# Patient Record
Sex: Male | Born: 1940 | Race: Black or African American | Hispanic: No | Smoking: Former smoker
Health system: Southern US, Community
[De-identification: ages and names within clinical notes are randomized; demographics above are authoritative.]

## PROBLEM LIST (undated history)

## (undated) DIAGNOSIS — I509 Heart failure, unspecified: Secondary | ICD-10-CM

## (undated) DIAGNOSIS — I482 Chronic atrial fibrillation, unspecified: Secondary | ICD-10-CM

## (undated) DIAGNOSIS — I429 Cardiomyopathy, unspecified: Secondary | ICD-10-CM

## (undated) DIAGNOSIS — J449 Chronic obstructive pulmonary disease, unspecified: Secondary | ICD-10-CM

## (undated) DIAGNOSIS — I4819 Other persistent atrial fibrillation: Secondary | ICD-10-CM

## (undated) DIAGNOSIS — R569 Unspecified convulsions: Secondary | ICD-10-CM

## (undated) DIAGNOSIS — J8410 Pulmonary fibrosis, unspecified: Secondary | ICD-10-CM

## (undated) DIAGNOSIS — I700 Atherosclerosis of aorta: Secondary | ICD-10-CM

## (undated) DIAGNOSIS — F1010 Alcohol abuse, uncomplicated: Secondary | ICD-10-CM

## (undated) DIAGNOSIS — J9621 Acute and chronic respiratory failure with hypoxia: Secondary | ICD-10-CM

## (undated) DIAGNOSIS — D099 Carcinoma in situ, unspecified: Secondary | ICD-10-CM

---

## 1999-10-11 ENCOUNTER — Emergency Department (HOSPITAL_COMMUNITY): Admission: EM | Admit: 1999-10-11 | Discharge: 1999-10-11 | Payer: Self-pay | Admitting: Emergency Medicine

## 2000-07-30 ENCOUNTER — Inpatient Hospital Stay (HOSPITAL_COMMUNITY): Admission: EM | Admit: 2000-07-30 | Discharge: 2000-08-04 | Payer: Self-pay | Admitting: Emergency Medicine

## 2000-07-30 ENCOUNTER — Encounter: Payer: Self-pay | Admitting: Internal Medicine

## 2001-05-25 ENCOUNTER — Encounter: Payer: Self-pay | Admitting: Emergency Medicine

## 2001-05-25 ENCOUNTER — Inpatient Hospital Stay (HOSPITAL_COMMUNITY): Admission: EM | Admit: 2001-05-25 | Discharge: 2001-06-05 | Payer: Self-pay | Admitting: Emergency Medicine

## 2001-05-27 ENCOUNTER — Encounter: Payer: Self-pay | Admitting: Internal Medicine

## 2001-05-31 ENCOUNTER — Encounter: Payer: Self-pay | Admitting: Internal Medicine

## 2001-06-04 ENCOUNTER — Encounter: Payer: Self-pay | Admitting: Internal Medicine

## 2001-06-12 ENCOUNTER — Encounter: Admission: RE | Admit: 2001-06-12 | Discharge: 2001-06-12 | Payer: Self-pay | Admitting: Internal Medicine

## 2001-11-23 ENCOUNTER — Encounter: Payer: Self-pay | Admitting: Emergency Medicine

## 2001-11-23 ENCOUNTER — Emergency Department (HOSPITAL_COMMUNITY): Admission: EM | Admit: 2001-11-23 | Discharge: 2001-11-23 | Payer: Self-pay | Admitting: Emergency Medicine

## 2002-01-09 ENCOUNTER — Inpatient Hospital Stay (HOSPITAL_COMMUNITY): Admission: EM | Admit: 2002-01-09 | Discharge: 2002-01-15 | Payer: Self-pay | Admitting: Emergency Medicine

## 2002-01-09 ENCOUNTER — Encounter: Payer: Self-pay | Admitting: Internal Medicine

## 2002-04-06 ENCOUNTER — Emergency Department (HOSPITAL_COMMUNITY): Admission: EM | Admit: 2002-04-06 | Discharge: 2002-04-06 | Payer: Self-pay

## 2002-05-23 ENCOUNTER — Emergency Department (HOSPITAL_COMMUNITY): Admission: EM | Admit: 2002-05-23 | Discharge: 2002-05-23 | Payer: Self-pay | Admitting: Emergency Medicine

## 2002-08-24 ENCOUNTER — Emergency Department (HOSPITAL_COMMUNITY): Admission: EM | Admit: 2002-08-24 | Discharge: 2002-08-24 | Payer: Self-pay | Admitting: Emergency Medicine

## 2002-09-18 ENCOUNTER — Emergency Department (HOSPITAL_COMMUNITY): Admission: EM | Admit: 2002-09-18 | Discharge: 2002-09-18 | Payer: Self-pay

## 2002-09-18 ENCOUNTER — Encounter: Payer: Self-pay | Admitting: Emergency Medicine

## 2002-11-27 ENCOUNTER — Emergency Department (HOSPITAL_COMMUNITY): Admission: EM | Admit: 2002-11-27 | Discharge: 2002-11-27 | Payer: Self-pay | Admitting: Emergency Medicine

## 2003-03-10 ENCOUNTER — Inpatient Hospital Stay (HOSPITAL_COMMUNITY): Admission: EM | Admit: 2003-03-10 | Discharge: 2003-03-18 | Payer: Self-pay | Admitting: Physical Therapy

## 2003-04-14 ENCOUNTER — Ambulatory Visit (HOSPITAL_COMMUNITY): Admission: RE | Admit: 2003-04-14 | Discharge: 2003-04-14 | Payer: Self-pay | Admitting: Family Medicine

## 2003-04-14 ENCOUNTER — Encounter: Payer: Self-pay | Admitting: Family Medicine

## 2004-04-13 ENCOUNTER — Emergency Department (HOSPITAL_COMMUNITY): Admission: EM | Admit: 2004-04-13 | Discharge: 2004-04-13 | Payer: Self-pay

## 2004-04-13 ENCOUNTER — Emergency Department (HOSPITAL_COMMUNITY): Admission: EM | Admit: 2004-04-13 | Discharge: 2004-04-13 | Payer: Self-pay | Admitting: Emergency Medicine

## 2006-03-30 ENCOUNTER — Emergency Department (HOSPITAL_COMMUNITY): Admission: EM | Admit: 2006-03-30 | Discharge: 2006-03-30 | Payer: Self-pay | Admitting: Emergency Medicine

## 2008-07-14 ENCOUNTER — Emergency Department (HOSPITAL_COMMUNITY): Admission: EM | Admit: 2008-07-14 | Discharge: 2008-07-14 | Payer: Self-pay | Admitting: Emergency Medicine

## 2008-08-18 ENCOUNTER — Encounter: Admission: RE | Admit: 2008-08-18 | Discharge: 2008-08-18 | Payer: Self-pay | Admitting: Family Medicine

## 2008-08-21 ENCOUNTER — Emergency Department (HOSPITAL_COMMUNITY): Admission: EM | Admit: 2008-08-21 | Discharge: 2008-08-21 | Payer: Self-pay | Admitting: Emergency Medicine

## 2009-06-18 ENCOUNTER — Emergency Department (HOSPITAL_COMMUNITY): Admission: EM | Admit: 2009-06-18 | Discharge: 2009-06-19 | Payer: Self-pay | Admitting: Emergency Medicine

## 2010-02-02 ENCOUNTER — Ambulatory Visit (HOSPITAL_COMMUNITY): Admission: RE | Admit: 2010-02-02 | Discharge: 2010-02-02 | Payer: Self-pay | Admitting: Emergency Medicine

## 2010-02-02 ENCOUNTER — Emergency Department (HOSPITAL_COMMUNITY): Admission: EM | Admit: 2010-02-02 | Discharge: 2010-02-02 | Payer: Self-pay | Admitting: Emergency Medicine

## 2011-02-04 LAB — CBC
HCT: 38.5 % — ABNORMAL LOW (ref 39.0–52.0)
Hemoglobin: 13.6 g/dL (ref 13.0–17.0)
Platelets: 213 10*3/uL (ref 150–400)
RDW: 13.5 % (ref 11.5–15.5)
WBC: 8.1 10*3/uL (ref 4.0–10.5)

## 2011-02-04 LAB — BASIC METABOLIC PANEL
CO2: 27 mEq/L (ref 19–32)
Creatinine, Ser: 0.83 mg/dL (ref 0.4–1.5)
GFR calc non Af Amer: >60 mL/min (ref >60)
Glucose, Bld: 122 mg/dL — ABNORMAL HIGH (ref 70–99)

## 2011-02-04 LAB — DIFFERENTIAL
Lymphocytes Relative: 14 % (ref 12–46)
Lymphs Abs: 1.1 10*3/uL (ref 0.7–4.0)
Neutrophils Relative %: 75 % (ref 43–77)

## 2011-02-04 LAB — URIC ACID: Uric Acid, Serum: 4.3 mg/dL (ref 4.0–7.8)

## 2011-03-17 NOTE — H&P (Signed)
[name]. [facility]  Patient:    Terry, Carter Visit Number: [id number] MRN: [id number]          Service Type: MED Location: [phone] Attending Physician:  Levy Sjogren Dictated by:   Rosanne Sack, M.D. Admit Date:  01/09/2002                           History and Physical  DATE OF BIRTH:  08/13/2041  PROBLEM LIST:  1. Alcohol withdrawal syndrome.  2. Seizure activity secondary to alcohol withdrawal and seizure disorder.     a. Noncompliant with Dilantin therapy.  3. Paroxysmal atrial fibrillation with rapid ventricular response, new     problem.  4. Dehydration.  5. Chronic obstructive pulmonary disease with ongoing tobacco abuse of one     pack per day x 45 years.  6. History of aspiration pneumonia in October of 2001.  7. Chronic alcohol abuse.     a. History of alcohol hepatitis.     b. Malnutrition.     c. Previous history of withdrawal syndrome.  8. Normocytic anemia with a hemoglobin of 12.8.  9. Organic gait disorder. 10. History of seizure disorder as described above. 11. Noncompliant with medications.  CHIEF COMPLAINT:  Tremor.  HISTORY OF PRESENT ILLNESS:  [name] is a very pleasant 70 year old gentleman who presents to the emergency department with tremor and slight confusion.  The patient had a tonic-clonic seizure witnessed by the ER staff. Reviewing the medical record, the patient has been admitted to the teaching service in the past.  Seizure activity due to alcohol withdrawal syndrome and seizure disorder have been described in the past.  The patient was supposed to take Dilantin for this problem.  The patient states that he has not taken his medication for months.  [name] is a little bit confused and the history taking was somewhat difficult.  The patient denies falls, syncope, shortness of breath, chest pain, orthopnea, PND, lower extremity swelling, skin rash, or headache.  He states that his  last drink, which was beer was about a day-and-a-half ago.  He denies any illegal drug use or IV drug abuse.  He states that he still continues smoking as in the past.  PAST MEDICAL HISTORY:  As in problem list.  ALLERGIES:  None.  MEDICATIONS:  The patient was not taking Dilantin as described above.  He was supposed to take Dilantin 200 mg in the morning, 100 mg at lunch, and 200 mg in the evening.  FAMILY MEDICAL HISTORY:  The patient denies diabetes, hypertension, early coronary artery disease, strokes, and malignancies in the family.  SOCIAL HISTORY:  The patient lives with his girlfriend here in [place], [place].  He smokes about a pack per day as described in the problem list.  He drinks beer, at least six to eight beers a day, as well as liquor. The patient is unemployed.  REVIEW OF SYSTEMS:  As in HPI.  PHYSICAL EXAMINATION:  Temperature 99.3 degrees, blood pressure 158/90, heart rate 115, currently heart rate of 65, respiratory rate 22, oxygen saturation 95% on room air.  HEENT:  Normocephalic and atraumatic.  Nonicteric sclerae.  Conjunctivae both within normal limits.  PERRLA.  EOMI.  Funduscopic exam negative for papilledema and hemorrhages.  TMs within normal limits.  Dry mucous membranes. Oropharynx clear.  NECK:  Supple.  No JVD.  No bruits.  No adenopathy.  No thyromegaly.  LUNGS:  Clear to auscultation bilaterally with occasional scattered rales.  No wheezes.  CARDIAC:  Currently normal sinus rhythm without murmurs, rubs, or gallops. Normal S1 and S2.  ABDOMEN:  Flat, nontender, and nondistended.  Bowel sounds were present.  No hepatosplenomegaly.  No rebound, no guarding, no masses, and no bruits.  GENITOURINARY:  Within normal limits.  RECTAL:  Slightly enlarged prostatic size without nodules or tenderness. Normal sphincter tone.  Empty vault.  EXTREMITIES:  No clubbing, cyanosis, or edema.  Pulses 2+ bilaterally.  NEUROLOGIC:  Slightly  confused.  Oriented just to location, which was hospital and name.  Strength 5/5 in all extremities.  DTRs 2-3/5 in all extremities. Decrease sensory in the lower extremities.  Plantar reflexes downgoing bilaterally.  LABORATORY DATA:  Dilantin level 0.4.  Hemoglobin 14.9, MCV 87, WBC 6.6, platelets 142.  Sodium 137, potassium 4.3, chloride 103, CO2 23, BUN 9, creatinine 1.1, glucose 103.  CK 456, CK-MB pending, troponin I 0.01.  Initial EKG consistent with atrial fibrillation.  Currently in normal sinus rhythm by telemetry.  CT scan of the head and chest x-ray pending.  ASSESSMENT AND PLAN: 1. Alcohol withdrawal syndrome.  As described in the HPI section, the patient    is a chronic heavy alcohol, who has had alcohol withdrawal syndrome in the    past.  By the hospital record, last year in July, the patient was admitted    with the same problems.  Will go ahead and admit the patient to a step-down    unit due to the recurrent alcohol withdrawal syndrome, paroxysmal atrial    fibrillation, and seizure activity.  Since the patient now is over the    postictal state and he is able to swallow, will start a Librium taper.    Will use Ativan as needed.  Also, multivitamins, thiamine, and folic acid    will be provided.  Ativan as needed will be used for agitation.  Once the    patient his over this alcohol withdrawal syndrome, information about AVS    and AA meetings and information will be provided.  Will keep the patient on    aspiration precautions and will follow the patients chest x-ray. 2. Seizure activity.  As described in the HPI section, the patient had a    witnessed tonic-clonic seizure in the emergency department.  Currently the    postictal state is pretty much resolved.  I believe that the CPKs are    slightly elevated due to the seizure activity and not to cardiac ischemia.    The patient has both alcohol withdrawal seizure and seizure disorder.  The    dilantin level is low.   For now, will obtain a head CT scan to rule out    subdural.  The patient was loaded with Dilantin 1 g IV in the emergency     department.  Will resume the Dilantin p.o.  A new dilantin level could be    obtained within five to seven days.  Seizure precautions also will be    provided for now. 3. Paroxysmal atrial fibrillation with rapid ventricular response.  As    described previously, the patient arrived in atrial fibrillation.    Initially the heart rate was 115, though an episode that was nonsustained    with a heart rate of 190 was noticed.  The patient was given 20 mg of IV    Cardizem bolus.  Currently the patient is in normal sinus rhythm with a  heart rate in the 60s.  I believe that this paroxysmal atrial fibrillation    is likely to be associated with the alcohol withdrawal syndrome.  Our plan    is to continue Cardizem p.o. for now and use aspirin for stroke    prophylaxis.  The patient has a very high risk for complications due to the    anticoagulation therapy due to his lack of compliance with medications and    chronic alcoholism along with gait disorder.  For completion, a TSH, a 2-D    echocardiogram, and cardiac enzyme series will be obtained.  Also, other    etiologies like alcohol cardiomyopathy need to be ruled out. 4. Dehydration.  The physical exam is consistent with dehydration.  Also, the    hemoglobin is within normal limits, while in the past the patient had a    hemoglobin in the 12 range.  Will go ahead and start IV fluids.  My lung    exam did not show any signs of pulmonary edema.  Will continue monitoring    the fluid balance and will recheck the electrolytes along with LFTs    tomorrow morning. Dictated by:   Rosanne Sack, M.D. Attending Physician:  Levy Sjogren DD:  01/09/02 TD:  01/10/02 Job: [id number]

## 2011-03-17 NOTE — Discharge Summary (Signed)
[place]. [facility]  Patient:    [name], [name] Visit Number: [id number] MRN: [id number]          Service Type: MED Location: [phone] Attending Physician:  Jetty Duhamel T Dictated by:   Jetty Duhamel, M.D. Admit Date:  01/09/2002 Discharge Date: 01/15/2002                             Discharge Summary  PRIMARY CARE PHYSICIAN:  Unassigned.  DISCHARGE DIAGNOSES: 1. Alcohol withdrawal syndrome, resolved. 2. Seizure activity.    a. Secondary to alcohol withdrawal.    b. Underlying primary seizure disorder.    c. Noncompliance with Dilantin. 3. Paroxysmal atrial fibrillation with intermittent rapid ventricular    response. 4. Dehydration, resolved. 5. Chronic obstructive pulmonary disease with ongoing tobacco use. 6. History of aspiration pneumonia in October 2001. 7. Chronic alcohol use with a history of hepatitis, malnutrition, delirium    tremens, normocytic anemia, and organic gait disorder.  DISCHARGE MEDICATIONS: 1. Aspirin 325 mg q.d. 2. Dilantin 100 mg t.i.d. 3. Cardizem CD 120 mg q.d.  FOLLOWUP:  The patient is instructed to follow up with his primary care physician whom he did later identify just prior to his discharge.  He is encouraged to do so within 7 to 10 days of his discharge.  He is further instructed he should drink no alcohol.  CONSULTATIONS:  None.  PROCEDURES:  A 2-D echocardiogram revealing overall left ventricular systolic function normal.  Left ventricular ejection fraction 55 to 65%.  No regional wall motion abnormalities.  HISTORY OF PRESENT ILLNESS:  [name] is a very pleasant 70 year old alcoholic who presented to the emergency room on the date of admission with tremor and slight confusion.  In the emergency room, the patient had a tonic-clonic seizure witnessed by the ER staff.  The patient was known by old records to have seizure activity due to alcohol withdrawal, as well as a seizure  disorder.  Medical team was aware that the patient was supposed to be taking Dilantin, but it was noted the patients Dilantin level was subtherapeutic.  The patient was admitted to the hospital for treatment of seizure disorder and alcohol withdrawal.  HOSPITAL COURSE:  [name] was admitted to the hospital on January 09, 2002.  Indication for admission was acute alcohol withdrawal and seizure activity secondary to seizure disorder and alcohol withdrawal.  At admission, CT scan failed to reveal any acute abnormalities.  The patients alcohol withdrawal symptoms were controlled with Librium on a standard dosing p.r.n. increases as needed.  He was additionally dosed with multivitamin, thymine, and folate.  As the patients mental status began to improve, he was educated on resources in the community for assistance with alcohol withdrawal. Concerning his paroxysmal atrial fibrillation, he was noted initially to have rate varying from 115 to 190.  He was placed on aspirin and Cardizem p.o. during the hospitalization to attempt to control rapid ventricular response. Echocardiogram was obtained with findings as noted above.  Because of his ongoing alcohol abuse and unsteady gait, he was deemed to be an inappropriate candidate for Coumadin therapy.  The patient was noted to be dehydrated at the time of admission.  This was corrected with IV fluids.  The patient was dosed with his Dilantin during his hospitalization.  With benzodiazepine treatment and Dilantin dosing, the patient quickly returned to his baseline mental status.  He was alert and oriented, and able  to provide a full history prior to his discharge.  It was noticed the patient seemed to be somewhat unstable on his feet, and this was consistent with his history of an organic gait disorder.  As a result, physical therapy and occupational therapy were consulted, and they recommended home health physical therapy and  occupational therapy, as well as a walker for assistance.  All arrangements were made for this prior to his discharge.  Prior to his discharge, his alcohol withdrawal completely resolved and he no longer required benzodiazepine therapy.  Again, he was educated on the community resources to discontinue alcohol, though he continued to deny that he had an alcohol problem.  Dilantin level was therapeutic on January 11, 2002, and he was continued on this medication for his primary seizure disorder. Normocytic anemia was appreciated during hospitalization.  This was felt to be secondary to malnutrition as well as the direct toxic effects of alcohol.  At the time of discharge, all arrangements were made for home health assistance. The patients wife assisted him in discharge home in accordance with the patients desires.  On the day prior to his discharge, the patient did reveal that he had a primary care physician in the community, and stated that he wished to follow up with her.  He was advised to do so within 7 to 10 days after his discharge.  At the time of discharge, vital signs were stable and the patient was afebrile.  There was no seizure activity noted after initial therapy with Dilantin. Dictated by:   Jetty Duhamel, M.D. Attending Physician:  Jetty Duhamel T DD:  02/11/02 TD:  02/11/02 Job: [id number]

## 2011-03-17 NOTE — Discharge Summary (Signed)
[name]   [place]. [facility]                                  MRN:  [id number]          Discharge Summary                                  ATT:  Alvester Morin, M.D.  Admitted:   05/25/01             DICT: [name], M.D. Discharged: 06/05/01 DISCHARGE DIAGNOSES: 1. Alcohol withdraw, current hospitalization. 2. Seizures secondary to primary seizure disorder and alcohol withdraw,     current hospitalization. 3. Malnutrition. 4. Urinary tract infection, current hospitalization. 5. Hypokalemia, current hospitalization. 6. Alcoholic hepatitis, current hospitalization, and history of alcoholic    hepatitis. 7. History of medical noncompliance. 8. Aspiration pneumonia in October 2001. 9. History of anemia secondary to alcohol use.  DISCHARGE MEDICATIONS: 1. Dilantin 200 mg one pill q.a.m. and one pill q.p.m. 2. Dilantin 100 mg one pill q.d. at lunch time. 3. Folic acid 1 mg one p.o. q.d. 4. Vitamin B1 100 mg one p.o. q.d.  FOLLOW-UP:  [name] is to report to the outpatient clinic at [facility] on the ground floor on June 12, 2001 at 2:50 p.m.  At that time, he will be evaluated for his compliance with his Dilantin therapy and his polysubstance abuse.  At that time, he will be assigned to a new continuity physician should he desire.  PROCEDURES AND DIAGNOSTIC STUDIES:  Chest x-ray from May 25, 2001, portable chest:  Mild volume loss at the lung bases.  No active infiltrate or effusion is seen.  Minimal cardiomegaly is noted.  CT of the head without contrast on May 27, 2001, impression:  Generalized atrophy with some small vessel ischemia associated with the periventricular areas.  No acute mass or hemorrhages seen.  Bones of the skull appear to be within the limits of normal as do the paranasal sinuses.  Chest x-ray from May 31, 2001, impression: Cardiomegaly with accentuated bibasilar interstitial  markings.  Left shoulder plain film:  There is no evidence of fracture or dislocation.  Humeral head is relatively superior located with respect to the glenoid fossa and this may be seen where there is rotator cuff degeneration, rotator cuff tears.  X-ray, plain film of humerus:  Impression is no evidence for fracture or dislocation. The humeral head is relatively superiorly located and this may be seen with rotator cuff degeneration or tears. No other significant bone or soft tissue abnormalities are identified.  Impression is normal study.  CONSULTANTS:  None.  HISTORY OF PRESENT ILLNESS:  [name] is a 70 year old African-American male with a known seizure disorder, alcoholism and withdraw status post multiple admissions in the past, secondary to medical noncompliance and alcohol related seizures.  He presented to the emergency room after having a seizure and continued tremulousness and confusion.  The patient was given 10 mg of Valium by EMS en route to emergency room.  His CBG was 156.  The patient states that his last alcohol intake was 48 hours ago. He appeared tremulous and confused on arrival to the emergency room.  His family reports that he has been drinking in the recent past including last night.  They also stated that  he "slumped over" today.  ADMISSION LABORATORY DATA:  Dilantin level 3.1.  Alcohol level 53.  Magnesium 2.2.  Blood gas:  pH 7.263, pCO2 29.3, bicarbonate 13.0, TCO2 14.  Hemoglobin 14, hematocrit 42. Sodium 140, potassium 3.8, chloride 109, glucose 146, BUN 7.  UDS negative.  CBC:  White blood count 7.9, hemoglobin 13.6, hematocrit 38.3, MCV 91.3, RDW 14.4, platelet count 150.  UA negative.  CK MB 6.4, total CK 570.  Complete metabolic profile:  Sodium 138, potassium 3.9, chloride 105, CO2 23, glucose 115, BUN 6, creatinine 0.9, total bilirubin 1.5.  Prolactin level 9.1.  Troponin 0.2. Phosphorous 1.7.  Ammonia 27.  HOSPITAL COURSE:  [name]  was evaluated in the emergency room and admitted for close observation and management of the following problems:  #1 - SEIZURES:  The patient was having seizures related to alcohol withdraw and/or his primary seizure disorder.  The patient came in subtherapeutic on Dilantin.  He has a long history of medical noncompliance.  He was loaded with IV Dilantin and started on Ativan q.6h. for alcohol withdraw.  The alcohol level was 53 on admission.  A UA was also obtained to rule out rhabdomyolysis. The patient was admitted to a step-down unit and monitored very closely.  He never had a witnessed seizure during his admission.  The patient became confused and disoriented several times during his admission but a seizure was never witnessed.  The patients Dilantin was manipulated and checked by pharmacy.  On the day of discharge his dose was therapeutic with a last level at 11.2.  The patient was taking Dilantin 200 mg one q.a.m. and one q.p.m. with 100 mg of Dilantin one every day at lunch time.  The patients chronic alcohol use also contributed to his seizures.  His withdraw was managed with Ativan q.6h. and then titrated to 2 mg every four hours.  The patient eventually began a Librium taper for this alcohol withdraw. He successfully completed this taper and remained on Ativan p.r.n. for the remainder of his hospital stay.  #2 - MALNUTRITION:  Likely secondary to chronic alcohol use.  The patient was started on folate and thiamine.  He was also discharged on these medications secondary to the patients inability to stop drinking.  #3 - URINARY TRACT INFECTION:  Urine culture grew out more than 10 of the 6 colonies of E. coli pansensitive.  The patient was started on Tequin and received a total of six days of therapy.  He was afebrile on the day of discharge with no symptoms of dysuria, hematuria or urinary frequency.  #4 - ALCOHOLIC HEPATITIS:  The patients LFTs were elevated but stable  during his hospitalization.  This will need to be followed in the outpatient setting secondary to the patients chronic use of alcohol.  #5 - HYPOKALEMIA:  The patients potassium was evaluated and replaced as  necessary.  On the day of discharge, his potassium was stable at 4.2.  The patients hypokalemia is likely secondary to GI loses and alcohol use.  #6 - MEDICAL NONCOMPLIANCE:  The patient has had several admissions for seizures secondary to his inability to remain on Dilantin.  The patient will be discharged with home health nursing with R.N. to supervise compliance.  #7 - DISPOSITION:  The patient will be discharged to his girlfriends home.  He was there in addition to his girlfriend and his girlfriends mother.  They have agreed to provide care for Mr. Douthit. Home Health consult was also obtained with  the help of social work.  The patient will receive home physical therapy, home occupational therapy and home nursing visits to assess for compliance with medications.  Social worker will also visit the patient at home after discharge.  DISCHARGE LABORATORY DATA:  Blood cultures times two negative.  Final CBC: White blood count 9.3, hemoglobin 12.8, MCV 90.9, RDW 14.1, platelets 427. Sodium 136, potassium 4.2, chloride 101, CO2 26, glucose 113, BUN 12, creatinine 1.1, calcium 9.1. DD:  06/15/01 TD:  06/17/01 Job: [id number]

## 2011-03-17 NOTE — Discharge Summary (Signed)
**Note Terry-Identified via Obfuscation** [place]  Patient:    [name], [name]                      MRN: [id number] Adm. Date:  [id number] Disc. Date: [id number] Attending:  Anastasio Auerbach CC:         Charlynne Pander. Bruna Potter, M.D.   Discharge Summary  DATE OF BIRTH:  [birth year]  DISCHARGE DIAGNOSES:  1. Seizure.     a. Noncompliant with Dilantin.     b. Chronic alcohol use, question withdrawal related.  2. Acute alcohol withdrawal, first episode.  3. Organic gait disorder.     a. Central nervous system atrophy.     b. Chronic alcohol use.     c. Malnutrition.  4. Aspiration pneumonitis, occurred during seizure.  5. 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  6. Hypokalemia, improved, secondary to malnutrition, alcohol use,     dehydration.  7. Alcoholic hepatitis, improved.  Viral studies negative.  8. Transient anemia, resolved.  9. Elevated ammonia, improved. 10. Incontinence, suspect secondary to bed rails/immobility in hospital.  DISCHARGE MEDICATIONS:  1. Dilantin 100 mg 2 p.o. q.a.m., 1 at noon, 2 q.p.m.  2. Multivitamin daily.  3. Albuterol nebulizer 2.5 mg x 7 days.  4. Atrovent 0.5 mg q.i.d. x 7 days.  5. Librium 10 mg 1 pill b.i.d. x 3 more days then stop.  CONDITION UPON DISCHARGE:  Stable.  [name] is awake and alert and understands instructions.  His girlfriend and girlfriends mother are here to pick him up and feel they can attend to his needs.  He requires assistance with ambulation and 24-hour supervision.  RECOMMENDED ACTIVITY:  Requires 24-hour supervision with assistance.  He was instructed to go from sitting to standing slowly and wait a minute before taking off walking.  RECOMMENDED DIET:  Regular, well-balanced meals.  SPECIAL INSTRUCTIONS:  1. Do not drink beer or alcohol.  2. Try to quit smoking.  3. Call Dr. Bruna Potter if problems or questions arise. I did leave a message for     him concerning this  hospital stay.  FOLLOWUP:  The patient is to contact [facility] at [phone] on Monday to schedule a followup appointment to be seen on Wednesday to recheck a Dilantin level as well as to update him on progress on ongoing alcohol abstinence.  CONSULTATIONS:  None.  PROCEDURES:  1. Head CT (October 2): Diffuse cerebral atrophy.  No acute abnormalities.  2. Chest x-ray (October 2), portable: Low volume inspiration with vascular     crowning, left lower lobe infiltrate.  HOSPITAL COURSE:  #1 - SEIZURE:  [name] is a 70 year old gentleman with an apparent history of seizure disorder on Dilantin.  He presents after having three seizures and being somewhat confused.  On admission, his Dilantin levels are not detectable.  He also uses alcohol regularly, and his alcohol level was not detectable as well.  He is treated urgently with a Dilantin load and started back on his oral dose.  Head CT in the emergency room showed no acute disease but fairly significant atrophy.  He had no further seizures this admission, but I suspect the developing aspiration pneumonitis is a result of the initial events.  This will be discussed below.  [name] had a Dilantin level on the day of discharge of 4.9.  This was on a dose of 200 mg b.i.d.  He has not had any recurrent seizure  activity.  I gave him another dose of 200 mg of Dilantin and asked him to take a total of 500 mg daily in divided doses.  He will follow up with Dr. Bruna Potter for a recheck of the level.  I think more importantly, he needs to stay off the alcohol as I suspect that the contribution of alcohol was as great as the lack of Dilantin in his system in contributing to his most recent seizure.  #2 - ACUTE ALCOHOL WITHDRAWAL:  As I expected, [name] became agitated approximately 48 hours into admission and required treatment with Librium. According to the family, he has not really abstained from alcohol use in the past, so fortunately  this was not as severe as I had anticipated.  He required treatment for several days but at the time of discharge was doing quite will and will be tapered off Librium in the next three days.  He received extensive information on ADS and understands the importance of compliance.  #3 - GAIT DISORDER:  [name] is somewhat unsteady on his feet.  We did feel he might have been dehydrated, and we gave him normal saline, but this did not change his orthostasis despite being euvolemic.  I suspect this is a chronic neuropathic etiology as a result of his longtime use of alcohol. [name] is not particularly symptomatic with this, but I did encourage him to wait a minute before moving from sitting to walking and also to have his girlfriend and his girlfriends mother provide 24-hour supervision and assistance as recommended by physical therapy.  They understand the risks of his falling and feel that they can handle him at home.  [name] is competent to make this decision and will be discharged with them.  #4 - ASPIRATION PNEUMONITIS:  [name] developed shortness of breath and on x-ray showed a left lower lobe infiltrate.  I did place him on Unasyn initially, but he did not develop a productive cough, and his oxygen saturations improved with nebulizer treatments.  He will not be sent home on antibiotics but will receive one week of nebulizers.  He knows to return if he develops productive cough or has fevers.  #5 - HYPOKALEMIA:  [name] did have a borderline magnesium, and both of these can be due to malnutrition as well as dehydration.  Alcohol use contributes to this.  It was replaced orally and is stable upon discharge.  DISCHARGE LABORATORY DATA:  Sodium 135, potassium 3.8, chloride 105, bicarb 22, glucose 115, BUN 8, creatinine 0.8, calcium 9.2.  Dilantin 4.9. Hemoglobin 12.8, MCV 87, WBC 11,000, platelet count 123.  Admission SGOT 258, SGPT 72.  Admission ammonia 156;  repeated on October 2, 45  (may have contributed to confusion; however, numbers cannot accurately correlate).  Hepatitis A IgM negative, hepatitis B surface antigen negative, hepatitis B core IgM negative, hepatitis C antibody negative.  TSH 2.168.  Urine drug screen negative. DD:  08/04/00 TD:  08/06/00 Job: [id number]

## 2011-03-17 NOTE — Discharge Summary (Signed)
[name], [name]                         ACCOUNT NO.:  [id number]   MEDICAL RECORD NO.:  [id number]                   PATIENT TYPE:  INP   LOCATION:  3034                                 FACILITY:  [facility]   PHYSICIAN:  Nani Gasser, M.D.            DATE OF BIRTH:  Nov 22, 1940   DATE OF ADMISSION:  03/10/2003  DATE OF DISCHARGE:  03/17/2003                                 DISCHARGE SUMMARY   DISCHARGE DIAGNOSES:  1. Left lower lobe pneumonia.  2. Seizure disorder.  3. Alcohol withdrawal.  4. Alcohol abuse.  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  6. Tobacco abuse.  7. Thrombocytopenia most likely secondary to alcohol.  8. History of paroxysmal atrial fibrillation.   DISCHARGE MEDICATIONS:  1. Multivitamins one p.o. daily.  2. Dilantin 200 mg p.o. q.a.m. and q.p.m. and 100 mg at noon.  3. Phenytoin 100 mg p.o. daily.  4. Protonix 40 mg p.o. daily.  5. Flagyl 500 mg p.o. q.8h.  6. Avelox 400 mg p.o. daily.   HISTORY OF PRESENT ILLNESS:  The patient is a 70 year old African American  male with a history of alcohol abuse and seizure disorder, who presented to  the emergency department by EMS after being found down by a neighbor, lying  in the driveway confused.  The patient was thought to be postictal and was  observed in the ED for approximately six hours.  He was still confused,  shaking, and tachycardic.  The patient reported that his alcoholic drink had  been one-and-a-half days ago.  Thus, he was kept for further evaluation.   HOSPITAL COURSE:  The following problems were addressed during admission:   #1 - SEIZURE DISORDER:  The patient does have a history of a seizure  disorder, though most likely may of his frequent seizures are due to his  alcohol abuse.  The patient actually was therapeutic on his Dilantin, which  is the only medication we were able to discover that the patient took.  His  level was 19.  We continued this during admission and the patient had no  further seizures.   #2 - ALCOHOL ABUSE:  The patient has a long history of alcohol abuse and  according to his sister, drinks daily until he gets drunk.   #3 - ALCOHOL WITHDRAWAL:  The patient initially was tachycardic, somewhat  confused, and did have tremors.  The patient was started on Librium for  several days, did well, and stabilized.  He was no longer tachycardic.  The  patient still remained confused.  The Librium was eventually tapered.   #4 - ALTERED MENTAL STATUS:  The patient has a long history of chronic  alcohol abuse and it was felt that the patient's underlying disorder was  most likely dementia secondary to his alcoholism.  The patient did have an  MRI of the head which showed atrophic changes in the temporal lobes.  Changes were quite progressive compared  to a previous CT scan in March of  2003.  The patient had no evidence of other stroke, mass, etc.  We did check  an ammonia level which was 31, a vitamin B12 level which was 1725, and a TSH  which was also normal at 4.199 to rule out other causes.   #5 - THROMBOCYTOPENIA:  The patient has a history of this and was  thrombocytopenic on admission.  This is most likely secondary to the  patient's alcohol abuse.  The patient does have a history of hepatitis  secondary to his alcohol use.  The total bilirubin was elevated at 1.8 upon  admission.   #6 - TOBACCO ABUSE:  The patient was started on a nicotine patch while in  the hospital to reduce his cravings per the patient's preference.   #7 - HISTORY OF PAROXYSMAL ATRIAL FIBRILLATION:  The patient was monitored  on telemetry for several days and had no evidence of arrhythmia.  After  discussing with the patient's sister what appears to be the patient's  underlying dementia and not delirium, it was felt that the patient would  best benefit from being in a nursing home where he could get around the  clock care.                                               Nani Gasser, M.D.    CM/MEDQ  D:  03/16/2003  T:  03/16/2003  Job:  [id number]

## 2011-08-01 LAB — POCT I-STAT, CHEM 8
BUN: 15
Chloride: 108
Creatinine, Ser: 1
HCT: 43
Potassium: 4.2
Sodium: 141

## 2011-08-01 LAB — CBC
HCT: 43
MCHC: 34.4
MCV: 90.3
RDW: 14
WBC: 6.5

## 2011-08-01 LAB — DIFFERENTIAL
Basophils Absolute: 0
Eosinophils Absolute: 0.1
Eosinophils Relative: 1
Lymphocytes Relative: 16
Monocytes Relative: 10
Neutro Abs: 4.8

## 2012-02-02 ENCOUNTER — Emergency Department (HOSPITAL_COMMUNITY)
Admission: EM | Admit: 2012-02-02 | Discharge: 2012-02-03 | Disposition: A | Discharge status: Home / Self Care | Payer: Medicare HMO | Attending: Emergency Medicine | Admitting: Emergency Medicine

## 2012-02-02 DIAGNOSIS — F1010 Alcohol abuse, uncomplicated: Secondary | ICD-10-CM | POA: Insufficient documentation

## 2012-02-02 DIAGNOSIS — F10929 Alcohol use, unspecified with intoxication, unspecified: Secondary | ICD-10-CM

## 2012-02-02 NOTE — ED Notes (Signed)
EMS sts pt picked up in public. [employer] on scene. Pt had drank one bottle [employer] that is know. Pt asked to come to hospital.

## 2012-02-02 NOTE — ED Notes (Signed)
Pt admits to ETOH use. Unable to obtain good HX due to intoxication and slurred speech. Pt does not seem to comprehend most questions.

## 2012-02-03 NOTE — Discharge Instructions (Signed)
Alcohol and Nutrition Nutrition serves two purposes. It provides energy. It also maintains body structure and function. Food supplies energy. It also provides the building blocks needed to replace worn or damaged cells. Alcoholics often eat poorly. This limits their supply of essential nutrients. This affects energy supply and structure maintenance. Alcohol also affects the body's nutrients in:  Digestion.   Storage.   Using and getting rid of waste products.  IMPAIRMENT OF NUTRIENT DIGESTION AND UTILIZATION   Once ingested, food must be broken down into small components (digested). Then it is available for energy. It helps maintain body structure and function. Digestion begins in the mouth. It continues in the stomach and intestines, with help from the pancreas. The nutrients from digested food are absorbed from the intestines into the blood. Then they are carried to the liver. The liver prepares nutrients for:   Immediate use.   Storage and future use.   Alcohol inhibits the breakdown of nutrients into usable molecules.   It decreases secretion of digestive enzymes from the pancreas.   Alcohol impairs nutrient absorption by damaging the cells lining the stomach and intestines.   It also interferes with moving some nutrients into the blood.   In addition, nutritional deficiencies themselves may lead to further absorption problems.   For example, folate deficiency changes the cells that line the small intestine. This impairs how water is absorbed. It also affects absorbed nutrients. These include glucose, sodium, and additional folate.   Even if nutrients are digested and absorbed, alcohol can prevent them from being fully used. It changes their transport, storage, and excretion. Impaired utilization of nutrients by alcoholics is indicated by:   Decreased liver stores of vitamins, such as vitamin A.   Increased excretion of nutrients such as fat.  ALCOHOL AND ENERGY SUPPLY   Three  basic nutritional components found in food are:   Carbohydrates.   Proteins.   Fats.   These are used as energy. Some alcoholics take in as much as 50% of their total daily calories from alcohol. They often neglect important foods.   Even when enough food is eaten, alcohol can impair the ways the body controls blood sugar (glucose) levels. It may either increase or decrease blood sugar.   In non-diabetic alcoholics, increased blood sugar (hyperglycemia) is caused by poor insulin secretion. It is usually temporary.   Decreased blood sugar (hypoglycemia) can cause serious injury even if this condition is short-lived. Low blood sugar can happen when a fasting or malnourished person drinks alcohol. When there is no food to supply energy, stored sugar is used up. The products of alcohol inhibit forming glucose from other compounds such as amino acids. As a result, alcohol causes the brain and other body tissue to lack glucose. It is needed for energy and function.   Alcohol is an energy source. But how the body processes and uses the energy from alcohol is complex. Also, when alcohol is substituted for carbohydrates, subjects tend to lose weight. This indicates that they get less energy from alcohol than from food.  ALCOHOL - MAINTAINING CELL STRUCTURE AND FUNCTION  Structure Cells are made mostly of protein. So an adequate protein diet is important for maintaining cell structure. This is especially true if cells are being damaged. Research indicates that alcohol affects protein nutrition by causing impaired:  Digestion of proteins to amino acids.   Processing of amino acids by the small intestine and liver.   Synthesis of proteins from amino acids.   Protein   secretion by the liver.  Function Nutrients are essential for the body to function well. They provide the tools that the body needs to work well:   Proteins.   Vitamins.   Minerals.  Alcohol can disrupt body function. It may cause  nutrient deficiencies. And it may interfere with the way nutrients are processed. Vitamins  Vitamins are essential to maintain growth and normal metabolism. They regulate many of the body`s processes. Chronic heavy drinking causes deficiencies in many vitamins. This is caused by eating less. And, in some cases, vitamins may be poorly absorbed. For example, alcohol inhibits fat absorption. It impairs how the vitamins A, E, and D are normally absorbed along with dietary fats. Not enough vitamin A may cause night blindness. Not enough vitamin D may cause softening of the bones.   Some alcoholics lack vitamins A, C, D, E, K, and the B vitamins. These are all involved in wound healing and cell maintenance. In particular, because vitamin K is necessary for blood clotting, lacking that vitamin can cause delayed clotting. The result is excess bleeding. Lacking other vitamins involved in brain function may cause severe neurological damage.  Minerals Deficiencies of minerals such as calcium, magnesium, iron, and zinc are common in alcoholics. The alcohol itself does not seem to affect how these minerals are absorbed. Rather, they seem to occur secondary to other alcohol-related problems, such as:  Less calcium absorbed.   Not enough magnesium.   More urinary excretion.   Vomiting.   Diarrhea.   Not enough iron due to gastrointestinal bleeding.   Not enough zinc or losses related to other nutrient deficiencies.   Mineral deficiencies can cause a variety of medical consequences. These range from calcium-related bone disease to zinc-related night blindness and skin lesions.  ALCOHOL, MALNUTRITION, AND MEDICAL COMPLICATIONS  Liver Disease   Alcoholic liver damage is caused primarily by alcohol itself. But poor nutrition may increase the risk of alcohol-related liver damage. For example, nutrients normally found in the liver are known to be affected by drinking alcohol. These include carotenoids, which  are the major sources of vitamin A, and vitamin E compounds. Decreases in such nutrients may play some role in alcohol-related liver damage.  Pancreatitis  Research suggests that malnutrition may increase the risk of developing alcoholic pancreatitis. Research suggests that a diet lacking in protein may increase alcohol's damaging effect on the pancreas.  Brain  Nutritional deficiencies may have severe effects on brain function. These may be permanent. Specifically, thiamine deficiencies are often seen in alcoholics. They can cause severe neurological problems. These include:   Impaired movement.   Memory loss seen in Wernicke-Korsakoff syndrome.  Pregnancy  Alcohol has toxic effects on fetal development. It causes alcohol-related birth defects. They include fetal alcohol syndrome. Alcohol itself is toxic to the fetus. Also, the nutritional deficiency can affect how the fetus develops. That may compound the risk of developmental damage.   Nutritional needs during pregnancy are 10% to 30% greater than normal. Food intake can increase by as much as 140% to cover the needs of both mother and fetus. An alcoholic mother`s nutritional problems may adversely affect the nutrition of the fetus. And alcohol itself can also restrict nutrition flow to the fetus.  NUTRITIONAL STATUS OF ALCOHOLICS  Techniques for assessing nutritional status include:  Taking body measurements to estimate fat reserves. They include:   Weight.   Height.   Mass.   Skin fold thickness.   Performing blood analysis to provide measurements of circulating:     Proteins.   Vitamins.   Minerals.   These techniques tend to be imprecise. For many nutrients, there is no clear "cut-off" point that would allow an accurate definition of deficiency. So assessing the nutritional status of alcoholics is limited by these techniques. Dietary status may provide information about the risk of developing nutritional problems. Dietary  status is assessed by:   Taking patients' dietary histories.   Evaluating the amount and types of food they are eating.   It is difficult to determine what exact amount of alcohol begins to have damaging effects on nutrition. In general, moderate drinkers have 2 drinks or less per day. They seem to be at little risk for nutritional problems. Various medical disorders begin to appear at greater levels.   Research indicates that the majority of even the heaviest drinkers have few obvious nutritional deficiencies. Many alcoholics who are hospitalized for medical complications of their disease do have severe malnutrition. Alcoholics tend to eat poorly. Often they eat less than the amounts of food necessary to provide enough:   Carbohydrates.   Protein.   Fat.   Vitamins A and C.   B vitamins.   Minerals like calcium and iron.  Of major concern is alcohol's effect on digesting food and use of nutrients. It may shift a mildly malnourished person toward severe malnutrition. Document Released: 08/10/2005 Document Revised: 10/05/2011 Document Reviewed: 01/24/2006 [employer] Patient Information 2012 [employer]. 

## 2012-02-03 NOTE — ED Provider Notes (Signed)
History     CSN: [id number]  Arrival date & time 02/02/12  2132   First MD Initiated Contact with Patient 02/02/12 2341      Chief Complaint  Patient presents with  . Alcohol Intoxication    (Consider location/radiation/quality/duration/timing/severity/associated sxs/prior treatment) Patient is a 71 y.o. male presenting with intoxication. The history is provided by the patient.  Alcohol Intoxication This is a chronic problem. Episode onset: unknown. The problem occurs constantly. The problem has not changed since onset.Pertinent negatives include no chest pain, no abdominal pain, no headaches and no shortness of breath. The symptoms are aggravated by nothing. The symptoms are relieved by nothing. He has tried nothing for the symptoms. The treatment provided no relief.    No past medical history on file.  No past surgical history on file.  No family history on file.  History  Substance Use Topics  . Smoking status: Not on file  . Smokeless tobacco: Not on file  . Alcohol Use: Not on file      Review of Systems  Constitutional: Negative for fever, chills and diaphoresis.  Respiratory: Negative for shortness of breath.   Cardiovascular: Negative for chest pain.  Gastrointestinal: Negative for abdominal pain.  Neurological: Negative for weakness and headaches.  All other systems reviewed and are negative.    Allergies  Review of patient's allergies indicates not on file.  Home Medications  No current outpatient prescriptions on file.  BP 113/68  Pulse 60  Temp(Src) 97.7 F (36.5 C) (Oral)  Resp 16  SpO2 95%  Physical Exam  Nursing note and vitals reviewed. Constitutional: He is oriented to person, place, and time. He appears well-developed and well-nourished. No distress.  HENT:  Head: Normocephalic and atraumatic.  Mouth/Throat: Oropharynx is clear and moist.  Eyes: Conjunctivae and EOM are normal. Pupils are equal, round, and reactive to light.  Neck:  Normal range of motion. Neck supple.  Cardiovascular: Normal rate, regular rhythm and intact distal pulses.   No murmur heard. Pulmonary/Chest: Effort normal and breath sounds normal. No respiratory distress. He has no wheezes. He has no rales.  Abdominal: Soft. He exhibits no distension. There is no tenderness. There is no rebound and no guarding.  Musculoskeletal: Normal range of motion. He exhibits no edema and no tenderness.  Neurological: He is alert and oriented to person, place, and time.  Skin: Skin is warm and dry. No rash noted. No erythema.  Psychiatric: He has a normal mood and affect. His behavior is normal.       Intoxicated and sleeping but will arouse to physical stimuli    ED Course  Procedures (including critical care time)  Labs Reviewed - No data to display No results found.   No diagnosis found.    MDM   Patient was brought here due to being intoxicated. On exam he is intoxicated but has no external signs of injury. There is a report of injury. Prior to patient arriving he stated he did not want to go to jail so taken to the ER. When arriving here he told the nurse there was nothing wrong with him and he was just drunk. Will allow patient to sober up prior to discharge home.  7:11 AM Patient now awake and ready to go home. He still has no complaints.      Gwyneth Sprout, MD 02/03/12 [id number]

## 2013-02-13 ENCOUNTER — Encounter (HOSPITAL_COMMUNITY): Payer: Self-pay | Admitting: Emergency Medicine

## 2013-02-16 ENCOUNTER — Telehealth (HOSPITAL_COMMUNITY): Payer: Self-pay | Admitting: Emergency Medicine

## 2013-02-16 NOTE — ED Notes (Signed)
+   Urine Chart sent to EDP office for review. 

## 2013-02-16 NOTE — ED Notes (Signed)
Patient has +Urine culture. °

## 2013-02-18 ENCOUNTER — Telehealth (HOSPITAL_COMMUNITY): Payer: Self-pay | Admitting: Emergency Medicine

## 2013-02-18 NOTE — ED Notes (Signed)
Called EPIC #, per person answering wrong number.  Letter sent to [facility] address.

## 2013-03-01 ENCOUNTER — Encounter: Payer: Self-pay | Admitting: Emergency Medicine

## 2013-03-16 ENCOUNTER — Telehealth (HOSPITAL_COMMUNITY): Payer: Self-pay | Admitting: Emergency Medicine

## 2013-03-16 NOTE — ED Notes (Signed)
No response to letter sent after 30 days. Chart sent to Medical Records. °

## 2013-04-17 ENCOUNTER — Other Ambulatory Visit (HOSPITAL_COMMUNITY): Payer: Self-pay | Admitting: Family Medicine

## 2013-04-17 ENCOUNTER — Ambulatory Visit (HOSPITAL_COMMUNITY)
Admission: RE | Admit: 2013-04-17 | Discharge: 2013-04-17 | Disposition: A | Discharge status: Home / Self Care | Payer: Medicare Other | Source: Ambulatory Visit | Attending: Family Medicine | Admitting: Family Medicine

## 2013-04-17 DIAGNOSIS — M25559 Pain in unspecified hip: Secondary | ICD-10-CM | POA: Insufficient documentation

## 2013-04-17 DIAGNOSIS — R52 Pain, unspecified: Secondary | ICD-10-CM

## 2013-04-17 LAB — LIPID PANEL
LDL Cholesterol: 74 mg/dL (ref 0–99)
Total CHOL/HDL Ratio: 1.8 RATIO
VLDL: 12 mg/dL (ref 0–40)

## 2013-04-17 LAB — CBC
Hemoglobin: 14.1 g/dL (ref 13.0–17.0)
MCHC: 36.6 g/dL — ABNORMAL HIGH (ref 30.0–36.0)
Platelets: 153 10*3/uL (ref 150–400)
RBC: 4.57 MIL/uL (ref 4.22–5.81)

## 2013-04-17 LAB — COMPREHENSIVE METABOLIC PANEL
ALT: 10 U/L (ref 0–53)
AST: 29 U/L (ref 0–37)
Alkaline Phosphatase: 65 U/L (ref 39–117)
GFR calc Af Amer: >90 mL/min (ref >90)
Glucose, Bld: 80 mg/dL (ref 70–99)
Potassium: 3.9 mEq/L (ref 3.5–5.1)
Sodium: 132 mEq/L — ABNORMAL LOW (ref 135–145)
Total Protein: 7.6 g/dL (ref 6.0–8.3)

## 2013-04-17 LAB — MICROALBUMIN, URINE: Microalb, Ur: 1.26 mg/dL (ref 0.00–1.89)

## 2013-07-07 ENCOUNTER — Encounter: Payer: Self-pay | Admitting: Gastroenterology

## 2013-09-18 ENCOUNTER — Encounter: Payer: Medicare HMO | Admitting: Gastroenterology

## 2015-07-14 ENCOUNTER — Emergency Department (HOSPITAL_COMMUNITY)
Admission: EM | Admit: 2015-07-14 | Discharge: 2015-07-14 | Disposition: A | Discharge status: Home / Self Care | Payer: Medicare HMO | Attending: Internal Medicine | Admitting: Internal Medicine

## 2015-07-14 ENCOUNTER — Encounter (HOSPITAL_COMMUNITY): Payer: Self-pay | Admitting: *Deleted

## 2015-07-14 ENCOUNTER — Emergency Department (HOSPITAL_COMMUNITY): Payer: Medicare HMO

## 2015-07-14 DIAGNOSIS — G40909 Epilepsy, unspecified, not intractable, without status epilepticus: Secondary | ICD-10-CM | POA: Insufficient documentation

## 2015-07-14 DIAGNOSIS — Z79899 Other long term (current) drug therapy: Secondary | ICD-10-CM | POA: Diagnosis not present

## 2015-07-14 DIAGNOSIS — R569 Unspecified convulsions: Secondary | ICD-10-CM

## 2015-07-14 LAB — CBC WITH DIFFERENTIAL/PLATELET
Basophils Absolute: 0.1 10*3/uL (ref 0.0–0.1)
Basophils Relative: 1 %
EOS PCT: 1 %
Eosinophils Absolute: 0.1 10*3/uL (ref 0.0–0.7)
HCT: 36.3 % — ABNORMAL LOW (ref 39.0–52.0)
Hemoglobin: 13.4 g/dL (ref 13.0–17.0)
LYMPHS ABS: 1 10*3/uL (ref 0.7–4.0)
LYMPHS PCT: 28 %
MCH: 32.1 pg (ref 26.0–34.0)
MCHC: 36.9 g/dL — AB (ref 30.0–36.0)
MCV: 87.1 fL (ref 78.0–100.0)
MONO ABS: 0.5 10*3/uL (ref 0.1–1.0)
MONOS PCT: 14 %
Neutro Abs: 2 10*3/uL (ref 1.7–7.7)
Neutrophils Relative %: 56 %
PLATELETS: 146 10*3/uL — AB (ref 150–400)
RBC: 4.17 MIL/uL — AB (ref 4.22–5.81)
RDW: 14.4 % (ref 11.5–15.5)
WBC: 3.5 10*3/uL — AB (ref 4.0–10.5)

## 2015-07-14 LAB — URINALYSIS, ROUTINE W REFLEX MICROSCOPIC
Bilirubin Urine: NEGATIVE
GLUCOSE, UA: NEGATIVE mg/dL
HGB URINE DIPSTICK: NEGATIVE
Ketones, ur: NEGATIVE mg/dL
Leukocytes, UA: NEGATIVE
Nitrite: NEGATIVE
PH: 7.5 (ref 5.0–8.0)
Protein, ur: NEGATIVE mg/dL
SPECIFIC GRAVITY, URINE: 1.015 (ref 1.005–1.030)
Urobilinogen, UA: 2 mg/dL — ABNORMAL HIGH (ref 0.0–1.0)

## 2015-07-14 LAB — BASIC METABOLIC PANEL
ANION GAP: 8 (ref 5–15)
BUN: 11 mg/dL (ref 6–20)
CO2: 28 mmol/L (ref 22–32)
Calcium: 8.9 mg/dL (ref 8.9–10.3)
Chloride: 104 mmol/L (ref 101–111)
Creatinine, Ser: 0.82 mg/dL (ref 0.61–1.24)
GFR calc Af Amer: >60 mL/min (ref >60)
GFR calc non Af Amer: >60 mL/min (ref >60)
GLUCOSE: 96 mg/dL (ref 65–99)
POTASSIUM: 4.7 mmol/L (ref 3.5–5.1)
Sodium: 140 mmol/L (ref 135–145)

## 2015-07-14 LAB — TROPONIN I: Troponin I: <0.03 ng/mL (ref <0.031)

## 2015-07-14 LAB — PHENYTOIN LEVEL, TOTAL: Phenytoin Lvl: <2.5 ug/mL — ABNORMAL LOW (ref 10.0–20.0)

## 2015-07-14 NOTE — ED Notes (Signed)
Bed: WA08 Expected date:  Expected time:  Means of arrival:  Comments: Ems seizure

## 2015-07-14 NOTE — ED Notes (Signed)
Pt left without receiving discharge papers/vitals.

## 2015-07-14 NOTE — Discharge Instructions (Signed)

## 2015-07-14 NOTE — ED Provider Notes (Signed)
CSN: [id number]     Arrival date & time 07/14/15  1208 History   First MD Initiated Contact with Patient 07/14/15 1335     Chief Complaint  Patient presents with  . Seizures     (Consider location/radiation/quality/duration/timing/severity/associated sxs/prior Treatment) Patient is a 74 y.o. male presenting with seizures.  Seizures Seizure activity on arrival: no   Seizure type:  Unable to specify Preceding symptoms: aura   Episode characteristics: unresponsiveness   Severity:  Severe Duration: unknown. Timing:  Once Progression:  Resolved Context: medical non-compliance (he is unclear about when and how much dilantin he is supposed to take.  He has not taken it in ayear.)   Context: not alcohol withdrawal (pt reports occasional, not every day drinking.  )   History of seizures: yes     History reviewed. No pertinent past medical history. History reviewed. No pertinent past surgical history. No family history on file. Social History  Substance Use Topics  . Smoking status: Never Smoker   . Smokeless tobacco: None  . Alcohol Use: No    Review of Systems  Neurological: Positive for seizures.  All other systems reviewed and are negative.     Allergies  Review of patient's allergies indicates no known allergies.  Home Medications   Prior to Admission medications   Medication Sig Start Date End Date Taking? Authorizing Provider  phenytoin (DILANTIN) 200 MG ER capsule Take 200 mg by mouth 3 (three) times daily.   Yes Historical Provider, MD   BP 166/85 mmHg  Pulse 73  Temp(Src) 98.6 F (37 C) (Oral)  Resp 16  SpO2 99% Physical Exam  Constitutional: He is oriented to person, place, and time. He appears well-developed and well-nourished. No distress.  Elderly, thin, no distress  HENT:  Head: Normocephalic and atraumatic.  Mouth/Throat: Oropharynx is clear and moist.  Eyes: Conjunctivae are normal. Pupils are equal, round, and reactive to light. No scleral  icterus.  Neck: Neck supple.  Cardiovascular: Normal rate, regular rhythm, normal heart sounds and intact distal pulses.   No murmur heard. Pulmonary/Chest: Effort normal and breath sounds normal. No stridor. No respiratory distress. He has no wheezes. He has no rales.  Abdominal: Soft. He exhibits no distension. There is no tenderness.  Musculoskeletal: Normal range of motion. He exhibits no edema.  Neurological: He is alert and oriented to person, place, and time. He has normal strength. No cranial nerve deficit or sensory deficit. Gait (normally uses cane to walk. ambulated with minimal assistance.) normal. GCS eye subscore is 4. GCS verbal subscore is 5. GCS motor subscore is 6.  Skin: Skin is warm and dry. No rash noted.  Psychiatric: He has a normal mood and affect. His behavior is normal.  Nursing note and vitals reviewed.   ED Course  Procedures (including critical care time) Labs Review Labs Reviewed  URINALYSIS, ROUTINE W REFLEX MICROSCOPIC (NOT AT [facility]) - Abnormal; Notable for the following:    Urobilinogen, UA 2.0 (*)    All other components within normal limits  CBC WITH DIFFERENTIAL/PLATELET - Abnormal; Notable for the following:    WBC 3.5 (*)    RBC 4.17 (*)    HCT 36.3 (*)    MCHC 36.9 (*)    Platelets 146 (*)    All other components within normal limits  PHENYTOIN LEVEL, TOTAL - Abnormal; Notable for the following:    Phenytoin Lvl <2.5 (*)    All other components within normal limits  BASIC METABOLIC PANEL  TROPONIN  I    Imaging Review Ct Head Wo Contrast  07/14/2015   CLINICAL DATA:  Seizure earlier today  EXAM: CT HEAD WITHOUT CONTRAST  TECHNIQUE: Contiguous axial images were obtained from the base of the skull through the vertex without intravenous contrast.  COMPARISON:  Report of prior brain MRI Mar 12, 2003 and report of prior head CT Mar 10, 2003 available. Images from those studies not available.  FINDINGS: There is moderate diffuse atrophy. There is no  intracranial mass, hemorrhage, extra-axial fluid collection, or midline shift. There is patchy small vessel disease in the centra semiovale bilaterally. Elsewhere gray-white compartments appear normal. There is no evidence of acute infarct. Bony calvarium appears intact. The mastoid air cells are clear.  IMPRESSION: Generalized atrophy with patchy periventricular small vessel disease. No hemorrhage, mass, or evidence suggesting acute infarct.   Electronically Signed   By: Lowella Grip III M.D.   On: 07/14/2015 14:08   I have personally reviewed and evaluated these images and lab results as part of my medical decision-making.   EKG Interpretation   Date/Time:  Wednesday July 14 2015 13:56:03 EDT Ventricular Rate:  70 PR Interval:  199 QRS Duration: 78 QT Interval:  396 QTC Calculation: 427 R Axis:   40 Text Interpretation:  Sinus rhythm LVH with repolarization abnormality no  longer evident Confirmed by [facility]  MD, TREY ([id number]) on 07/14/2015 3:34:46  PM      MDM   Final diagnoses:  Seizure    74 yo male who thinks he had a seizure because he had his typical aura and then a neighbor found him in his chair somnolent.  He is a poor historian, but denies alcohol abuse, recent illnesses, fevers, or headaches.  He has a history of seizures and appears to be noncompliant with his dilantin.  He remained well appearing, nontoxic, and asymptomatic during his ED course.  I advised him to followup closely with his PCP.      Serita Grit, MD 07/15/15 [phone]

## 2015-07-14 NOTE — ED Notes (Addendum)
Per PTAR, pt had seizure at 9AM today. Pt complains of tremors in his hands since the seizure. Pt states he diagnosed with seizure ~1 year ago and was put on dilantin. Pt states he only takes dilantin after having a seizure. Pt states his last seizure was 1 year ago. Pt took dilantin at 10AM today. CBG 100.

## 2016-02-28 ENCOUNTER — Encounter (HOSPITAL_COMMUNITY): Payer: Self-pay | Admitting: Emergency Medicine

## 2016-02-28 ENCOUNTER — Emergency Department (HOSPITAL_COMMUNITY)
Admission: EM | Admit: 2016-02-28 | Discharge: 2016-02-28 | Disposition: A | Discharge status: Home / Self Care | Payer: Medicare HMO | Attending: Emergency Medicine | Admitting: Emergency Medicine

## 2016-02-28 ENCOUNTER — Emergency Department (HOSPITAL_COMMUNITY): Payer: Medicare HMO

## 2016-02-28 DIAGNOSIS — Y939 Activity, unspecified: Secondary | ICD-10-CM | POA: Insufficient documentation

## 2016-02-28 DIAGNOSIS — S5291XA Unspecified fracture of right forearm, initial encounter for closed fracture: Secondary | ICD-10-CM

## 2016-02-28 DIAGNOSIS — Y929 Unspecified place or not applicable: Secondary | ICD-10-CM | POA: Diagnosis not present

## 2016-02-28 DIAGNOSIS — Z79899 Other long term (current) drug therapy: Secondary | ICD-10-CM | POA: Diagnosis not present

## 2016-02-28 DIAGNOSIS — Y999 Unspecified external cause status: Secondary | ICD-10-CM | POA: Diagnosis not present

## 2016-02-28 DIAGNOSIS — F10920 Alcohol use, unspecified with intoxication, uncomplicated: Secondary | ICD-10-CM

## 2016-02-28 DIAGNOSIS — S4991XA Unspecified injury of right shoulder and upper arm, initial encounter: Secondary | ICD-10-CM | POA: Diagnosis present

## 2016-02-28 DIAGNOSIS — F10120 Alcohol abuse with intoxication, uncomplicated: Secondary | ICD-10-CM | POA: Diagnosis not present

## 2016-02-28 DIAGNOSIS — S52501A Unspecified fracture of the lower end of right radius, initial encounter for closed fracture: Secondary | ICD-10-CM | POA: Diagnosis not present

## 2016-02-28 LAB — CBC WITH DIFFERENTIAL/PLATELET
BASOS ABS: 0.1 10*3/uL (ref 0.0–0.1)
BASOS PCT: 1 %
EOS ABS: 0.1 10*3/uL (ref 0.0–0.7)
Eosinophils Relative: 1 %
HCT: 34.8 % — ABNORMAL LOW (ref 39.0–52.0)
HEMOGLOBIN: 13.1 g/dL (ref 13.0–17.0)
LYMPHS PCT: 18 %
Lymphs Abs: 1.4 10*3/uL (ref 0.7–4.0)
MCH: 31.9 pg (ref 26.0–34.0)
MCHC: 37.6 g/dL — AB (ref 30.0–36.0)
MCV: 84.7 fL (ref 78.0–100.0)
MONO ABS: 0.5 10*3/uL (ref 0.1–1.0)
Monocytes Relative: 7 %
NEUTROS ABS: 5.7 10*3/uL (ref 1.7–7.7)
Neutrophils Relative %: 73 %
PLATELETS: 227 10*3/uL (ref 150–400)
RBC: 4.11 MIL/uL — ABNORMAL LOW (ref 4.22–5.81)
RDW: 14 % (ref 11.5–15.5)
WBC: 7.8 10*3/uL (ref 4.0–10.5)

## 2016-02-28 LAB — ETHANOL: Alcohol, Ethyl (B): 229 mg/dL — ABNORMAL HIGH (ref <5)

## 2016-02-28 LAB — COMPREHENSIVE METABOLIC PANEL
ALBUMIN: 4.2 g/dL (ref 3.5–5.0)
ALT: 19 U/L (ref 17–63)
ANION GAP: 12 (ref 5–15)
AST: 51 U/L — ABNORMAL HIGH (ref 15–41)
Alkaline Phosphatase: 74 U/L (ref 38–126)
BUN: 7 mg/dL (ref 6–20)
CALCIUM: 8.7 mg/dL — AB (ref 8.9–10.3)
CHLORIDE: 104 mmol/L (ref 101–111)
CO2: 24 mmol/L (ref 22–32)
Creatinine, Ser: 0.76 mg/dL (ref 0.61–1.24)
GFR calc non Af Amer: >60 mL/min (ref >60)
GLUCOSE: 127 mg/dL — AB (ref 65–99)
POTASSIUM: 4.5 mmol/L (ref 3.5–5.1)
SODIUM: 140 mmol/L (ref 135–145)
Total Bilirubin: 0.7 mg/dL (ref 0.3–1.2)
Total Protein: 8.1 g/dL (ref 6.5–8.1)

## 2016-02-28 MED ORDER — OXYCODONE-ACETAMINOPHEN 5-325 MG PO TABS: 1.0000 | ORAL_TABLET | ORAL | Status: DC | PRN
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
## 2016-02-28 NOTE — Discharge Instructions (Signed)
Please return immediately if any new or worsening signs or symptoms present. Please follow-up with orthopedic hand surgeon for reevaluation and further management tomorrow. Please do not drink alcohol while taking narcotic pain medication.
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
## 2016-02-28 NOTE — ED Notes (Signed)
Ortho tech at bedside to apply splint.

## 2016-02-28 NOTE — ED Notes (Signed)
Pt reports understanding of discharge information. No questions at time of discharge 

## 2016-05-11 ENCOUNTER — Emergency Department (HOSPITAL_COMMUNITY)
Admission: EM | Admit: 2016-05-11 | Discharge: 2016-05-12 | Disposition: A | Discharge status: Home / Self Care | Payer: Medicare HMO | Attending: Emergency Medicine | Admitting: Emergency Medicine

## 2016-05-11 ENCOUNTER — Emergency Department (HOSPITAL_COMMUNITY): Payer: Medicare HMO

## 2016-05-11 ENCOUNTER — Encounter (HOSPITAL_COMMUNITY): Payer: Self-pay | Admitting: Emergency Medicine

## 2016-05-11 DIAGNOSIS — E871 Hypo-osmolality and hyponatremia: Secondary | ICD-10-CM | POA: Insufficient documentation

## 2016-05-11 DIAGNOSIS — S32301K Unspecified fracture of right ilium, subsequent encounter for fracture with nonunion: Secondary | ICD-10-CM | POA: Insufficient documentation

## 2016-05-11 DIAGNOSIS — E46 Unspecified protein-calorie malnutrition: Secondary | ICD-10-CM | POA: Insufficient documentation

## 2016-05-11 DIAGNOSIS — R21 Rash and other nonspecific skin eruption: Secondary | ICD-10-CM | POA: Diagnosis present

## 2016-05-11 DIAGNOSIS — X58XXXA Exposure to other specified factors, initial encounter: Secondary | ICD-10-CM | POA: Insufficient documentation

## 2016-05-11 DIAGNOSIS — S52201K Unspecified fracture of shaft of right ulna, subsequent encounter for closed fracture with nonunion: Secondary | ICD-10-CM

## 2016-05-11 HISTORY — DX: Unspecified convulsions: R56.9

## 2016-05-11 LAB — BASIC METABOLIC PANEL
ANION GAP: 9 (ref 5–15)
BUN: 8 mg/dL (ref 6–20)
CALCIUM: 8.5 mg/dL — AB (ref 8.9–10.3)
CO2: 23 mmol/L (ref 22–32)
Chloride: 97 mmol/L — ABNORMAL LOW (ref 101–111)
Creatinine, Ser: 0.7 mg/dL (ref 0.61–1.24)
GFR calc non Af Amer: >60 mL/min (ref >60)
Glucose, Bld: 99 mg/dL (ref 65–99)
Potassium: 4.3 mmol/L (ref 3.5–5.1)
SODIUM: 129 mmol/L — AB (ref 135–145)

## 2016-05-11 LAB — CBC
HCT: 29.1 % — ABNORMAL LOW (ref 39.0–52.0)
HEMOGLOBIN: 10.7 g/dL — AB (ref 13.0–17.0)
MCH: 29.8 pg (ref 26.0–34.0)
MCHC: 36.8 g/dL — ABNORMAL HIGH (ref 30.0–36.0)
MCV: 81.1 fL (ref 78.0–100.0)
Platelets: 159 10*3/uL (ref 150–400)
RBC: 3.59 MIL/uL — AB (ref 4.22–5.81)
RDW: 14.8 % (ref 11.5–15.5)
WBC: 3.1 10*3/uL — AB (ref 4.0–10.5)

## 2016-05-11 NOTE — ED Notes (Signed)
Patient transported to X-ray 

## 2016-05-11 NOTE — ED Provider Notes (Signed)
CSN: [id number]     Arrival date & time 05/11/16  1853 History   First MD Initiated Contact with Patient 05/11/16 2205     Chief Complaint  Patient presents with  . Rash  . Foot Problem     (Consider location/radiation/quality/duration/timing/severity/associated sxs/prior Treatment) HPI   [name] is a 75 y.o. male who is brought into the hospital, by family members, a sister and her husband, to be evaluated for weight loss. They are concerned that he appears to have less and is drinking too much alcohol. Is a report that he has a splint on his right arm, which was placed 2 months ago. They found him unkempt, with dirty socks on, and changes. They were concerned about peeling skin on his feet. They're concerned that his girlfriend is not taking care of him properly. He lives with his girlfriend. They are unable to give further history. Patient is calm and comfortable and denies any problems. There are no other known modifying factors.   Past Medical History  Diagnosis Date  . Seizures ([place])    History reviewed. No pertinent past surgical history. History reviewed. No pertinent family history. Social History  Substance Use Topics  . Smoking status: Never Smoker   . Smokeless tobacco: None  . Alcohol Use: Yes    Review of Systems  All other systems reviewed and are negative.     Allergies  Review of patient's allergies indicates no known allergies.  Home Medications   Prior to Admission medications   Medication Sig Start Date End Date Taking? Authorizing Provider  phenytoin (DILANTIN) 200 MG ER capsule Take 200 mg by mouth 3 (three) times daily as needed (seizures).    Yes Historical Provider, MD  oxyCODONE-acetaminophen (PERCOCET/ROXICET) 5-325 MG tablet Take 1 tablet by mouth every 4 (four) hours as needed for severe pain. Patient not taking: Reported on 05/11/2016 02/28/16   Okey Regal, PA-C   BP 121/74 mmHg  Pulse 74  Temp(Src) 98.4 F (36.9 C) (Oral)  Resp  12  SpO2 99% Physical Exam  Constitutional: He is oriented to person, place, and time. He appears well-developed.  Elderly, frail  HENT:  Head: Normocephalic and atraumatic.  Right Ear: External ear normal.  Left Ear: External ear normal.  Eyes: Conjunctivae and EOM are normal. Pupils are equal, round, and reactive to light.  Neck: Normal range of motion and phonation normal. Neck supple.  Cardiovascular: Normal rate, regular rhythm and normal heart sounds.   Pulmonary/Chest: Effort normal and breath sounds normal. He exhibits no bony tenderness.  Abdominal: Soft. He exhibits no distension. There is no tenderness. There is no guarding.  Musculoskeletal: Normal range of motion.  Neurological: He is alert and oriented to person, place, and time. No cranial nerve deficit or sensory deficit. He exhibits normal muscle tone. Coordination normal.  Skin: Skin is warm, dry and intact.  Psychiatric: He has a normal mood and affect. His behavior is normal. Judgment and thought content normal.  Nursing note and vitals reviewed.   ED Course  Procedures (including critical care time)  Medications - No data to display  Patient Vitals for the past 24 hrs:  BP Temp Temp src Pulse Resp SpO2  05/11/16 2250 121/74 mmHg - - 74 12 99 %  05/11/16 2230 121/74 mmHg - - 73 13 100 %  05/11/16 1914 91/59 mmHg 98.4 F (36.9 C) Oral 75 16 100 %     11:43 PM Reevaluation with update and discussion. After initial assessment and  treatment, an updated evaluation reveals No change in clinical status. He remains comfortable. Findings discussed with patient's sister and her husband. All questions were answered. Aliea Bobe L      Labs Review Labs Reviewed  BASIC METABOLIC PANEL - Abnormal; Notable for the following:    Sodium 129 (*)    Chloride 97 (*)    Calcium 8.5 (*)    All other components within normal limits  CBC - Abnormal; Notable for the following:    WBC 3.1 (*)    RBC 3.59 (*)     Hemoglobin 10.7 (*)    HCT 29.1 (*)    MCHC 36.8 (*)    All other components within normal limits    Imaging Review Dg Forearm Right  05/11/2016  CLINICAL DATA:  Right arm was casted in May. Cast was removed tonight. EXAM: RIGHT FOREARM - 2 VIEW COMPARISON:  02/28/2016 FINDINGS: There is a mostly transverse and slightly comminuted fracture of the midshaft right radius. The fracture line remains widely visible although there is some callus formation consistent with interval healing. There is residual ulnar displacement and radial angulation of the distal fracture fragment with about 6 mm overlap. Degenerative changes in the carpus and radiocarpal joints. Calcification in the triangular fibrocartilage. Diffuse bone demineralization. 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 Callus formation is present Electronically Signed   By: Lucienne Capers M.D.   On: 05/11/2016 23:39   I have personally reviewed and evaluated these images and lab results as part of my medical decision-making.   EKG Interpretation None      MDM   Final diagnoses:  Ulnar fracture, right, closed, with nonunion, subsequent encounter  Malnutrition ([place])  Hyponatremia    Malnutrition. Poor social situation, decline in ability to care for self. Mild hyponatremia. Hemoglobin low, without evident source for bleeding. Untreated right ulnar fracture, which had been planned for surgery after initial injury in May 2017. No indicators for admission at this time. Doubt serious bacterial infection. Metabolic instability or impending vascular collapse.   Nursing Notes Reviewed/ Care Coordinated Applicable Imaging Reviewed Interpretation of Laboratory Data incorporated into ED treatment  The patient appears reasonably screened and/or stabilized for discharge and I doubt any other medical condition or other [facility] requiring further screening, evaluation, or  treatment in the ED at this time prior to discharge.  Plan: Home Medications- usual; Home Treatments- rest; return here if the recommended treatment, does not improve the symptoms; Recommended follow up- PCP asap. Hand Surgery asap for surgical repair of right ulna fracture     Daleen Bo, MD 05/11/16 2351

## 2016-05-11 NOTE — ED Notes (Addendum)
Pt from home. Family reports pt has not been taking care of himself. Drinks etoh daily, last drink this am. Denies detox symptoms at present. Had splint placed 2 months ago and has not followed up with orthopedics. Also reports scaling and some swelling to bilateral feet. Family reports he also has been losing weight.

## 2016-05-11 NOTE — ED Notes (Signed)
Awaiting Ortho Tech to apply splint then discharge.

## 2016-05-11 NOTE — Discharge Instructions (Signed)
Avoid drinking all forms of alcohol. Try to eat 3 regular meals each day. We are arranging for home health services to evaluate you in your home. They can help to arrange further treatment and possibly get you into an assisted living facility. Call the orthopedic doctor for an appointment to be seen about the right arm fracture.  Hyponatremia Hyponatremia is when the amount of salt (sodium) in your blood is too low. When salt levels are low, your cells absorb extra water and they swell. The swelling happens throughout the body, but it mostly affects the brain.  HOME CARE  Take medicines only as told by your doctor. Many medicines can make this condition worse. Talk with your doctor about any medicines that you are currently taking.  Carefully follow a recommended diet as told by your doctor.  Carefully follow instructions from your doctor about fluid restrictions.  Keep all follow-up visits as told by your doctor. This is important.  Do not drink alcohol. GET HELP IF:  You feel sicker to your stomach (nauseous).  You feel more confused.  You feel more tired (fatigued).  Your headache gets worse.  You feel weaker.  Your symptoms go away and then they come back.  You have trouble following the diet instructions. GET HELP RIGHT AWAY IF:  You start to twitch and shake (have a seizure).  You pass out (faint).  You keep having watery poop (diarrhea).  You keep throwing up (vomiting).   This information is not intended to replace advice given to you by your health care provider. Make sure you discuss any questions you have with your health care provider.   Document Released: 06/28/2011 Document Revised: 03/02/2015 Document Reviewed: 10/12/2014 Elsevier Interactive Patient Education [employer].

## 2016-05-12 ENCOUNTER — Telehealth: Payer: Self-pay | Admitting: *Deleted

## 2016-05-12 NOTE — ED Notes (Signed)
Case management notified of need for follow up

## 2016-05-12 NOTE — ED Notes (Signed)
Case management contacted regarding home health services. A detailed voicemail was left including contact information.

## 2016-05-12 NOTE — ED Notes (Signed)
Contact for patient is his sister Lorna Dibble: House # [phone] Cell # [phone] Please have case manager contact her

## 2016-05-12 NOTE — ED Notes (Signed)
Case management called regarding home health services for patient. A detailed voicemail was left including contact information.

## 2016-07-09 ENCOUNTER — Emergency Department (HOSPITAL_COMMUNITY): Payer: Medicare HMO

## 2016-07-09 ENCOUNTER — Inpatient Hospital Stay (HOSPITAL_COMMUNITY)
Admission: EM | Admit: 2016-07-09 | Discharge: 2016-07-15 | DRG: 183 | Disposition: A | Discharge status: Skilled Nursing Facility | Payer: Medicare HMO | Attending: Internal Medicine | Admitting: Internal Medicine

## 2016-07-09 ENCOUNTER — Encounter (HOSPITAL_COMMUNITY): Payer: Self-pay

## 2016-07-09 DIAGNOSIS — F10929 Alcohol use, unspecified with intoxication, unspecified: Secondary | ICD-10-CM

## 2016-07-09 DIAGNOSIS — G40909 Epilepsy, unspecified, not intractable, without status epilepticus: Secondary | ICD-10-CM | POA: Diagnosis present

## 2016-07-09 DIAGNOSIS — F17200 Nicotine dependence, unspecified, uncomplicated: Secondary | ICD-10-CM | POA: Diagnosis present

## 2016-07-09 DIAGNOSIS — J8410 Pulmonary fibrosis, unspecified: Secondary | ICD-10-CM | POA: Diagnosis present

## 2016-07-09 DIAGNOSIS — S2242XA Multiple fractures of ribs, left side, initial encounter for closed fracture: Principal | ICD-10-CM | POA: Diagnosis present

## 2016-07-09 DIAGNOSIS — W19XXXA Unspecified fall, initial encounter: Secondary | ICD-10-CM

## 2016-07-09 DIAGNOSIS — E871 Hypo-osmolality and hyponatremia: Secondary | ICD-10-CM

## 2016-07-09 DIAGNOSIS — R109 Unspecified abdominal pain: Secondary | ICD-10-CM

## 2016-07-09 DIAGNOSIS — R1312 Dysphagia, oropharyngeal phase: Secondary | ICD-10-CM

## 2016-07-09 DIAGNOSIS — Z8249 Family history of ischemic heart disease and other diseases of the circulatory system: Secondary | ICD-10-CM

## 2016-07-09 DIAGNOSIS — S2239XA Fracture of one rib, unspecified side, initial encounter for closed fracture: Secondary | ICD-10-CM

## 2016-07-09 DIAGNOSIS — D72819 Decreased white blood cell count, unspecified: Secondary | ICD-10-CM | POA: Diagnosis present

## 2016-07-09 DIAGNOSIS — S2232XA Fracture of one rib, left side, initial encounter for closed fracture: Secondary | ICD-10-CM

## 2016-07-09 DIAGNOSIS — Y92009 Unspecified place in unspecified non-institutional (private) residence as the place of occurrence of the external cause: Secondary | ICD-10-CM

## 2016-07-09 DIAGNOSIS — F10229 Alcohol dependence with intoxication, unspecified: Secondary | ICD-10-CM | POA: Diagnosis present

## 2016-07-09 DIAGNOSIS — E43 Unspecified severe protein-calorie malnutrition: Secondary | ICD-10-CM | POA: Diagnosis present

## 2016-07-09 DIAGNOSIS — Y908 Blood alcohol level of 240 mg/100 ml or more: Secondary | ICD-10-CM | POA: Diagnosis present

## 2016-07-09 DIAGNOSIS — D649 Anemia, unspecified: Secondary | ICD-10-CM | POA: Diagnosis present

## 2016-07-09 DIAGNOSIS — G9341 Metabolic encephalopathy: Secondary | ICD-10-CM | POA: Diagnosis present

## 2016-07-09 HISTORY — DX: Alcohol abuse, uncomplicated: F10.10

## 2016-07-09 LAB — CBC WITH DIFFERENTIAL/PLATELET
Basophils Absolute: 0.1 10*3/uL (ref 0.0–0.1)
Basophils Relative: 1 %
EOS PCT: 2 %
Eosinophils Absolute: 0.1 10*3/uL (ref 0.0–0.7)
HCT: 33.6 % — ABNORMAL LOW (ref 39.0–52.0)
Hemoglobin: 12.3 g/dL — ABNORMAL LOW (ref 13.0–17.0)
LYMPHS ABS: 1.1 10*3/uL (ref 0.7–4.0)
LYMPHS PCT: 28 %
MCH: 30.4 pg (ref 26.0–34.0)
MCHC: 36.6 g/dL — ABNORMAL HIGH (ref 30.0–36.0)
MCV: 83.2 fL (ref 78.0–100.0)
Monocytes Absolute: 0.6 10*3/uL (ref 0.1–1.0)
Monocytes Relative: 15 %
Neutro Abs: 2 10*3/uL (ref 1.7–7.7)
Neutrophils Relative %: 53 %
PLATELETS: 208 10*3/uL (ref 150–400)
RBC: 4.04 MIL/uL — AB (ref 4.22–5.81)
RDW: 14.5 % (ref 11.5–15.5)
WBC: 3.7 10*3/uL — AB (ref 4.0–10.5)

## 2016-07-09 LAB — BASIC METABOLIC PANEL
Anion gap: 8 (ref 5–15)
BUN: 7 mg/dL (ref 6–20)
CHLORIDE: 92 mmol/L — AB (ref 101–111)
CO2: 25 mmol/L (ref 22–32)
Calcium: 8.1 mg/dL — ABNORMAL LOW (ref 8.9–10.3)
Creatinine, Ser: 0.63 mg/dL (ref 0.61–1.24)
GFR calc Af Amer: >60 mL/min (ref >60)
GFR calc non Af Amer: >60 mL/min (ref >60)
GLUCOSE: 115 mg/dL — AB (ref 65–99)
POTASSIUM: 3.5 mmol/L (ref 3.5–5.1)
SODIUM: 125 mmol/L — AB (ref 135–145)

## 2016-07-09 LAB — PROTIME-INR
INR: 1.01
Prothrombin Time: 13.3 seconds (ref 11.4–15.2)

## 2016-07-09 LAB — PHENYTOIN LEVEL, TOTAL: Phenytoin Lvl: <2.5 ug/mL — ABNORMAL LOW (ref 10.0–20.0)

## 2016-07-09 LAB — ETHANOL: Alcohol, Ethyl (B): 292 mg/dL — ABNORMAL HIGH (ref <5)

## 2016-07-09 MED ORDER — SODIUM CHLORIDE 0.9 % IV BOLUS (SEPSIS): 1000.0000 mL | Freq: Once | INTRAVENOUS | Status: DC

## 2016-07-09 MED ORDER — SODIUM CHLORIDE 0.9 % IV SOLN
INTRAVENOUS | Status: DC
  Administered 2016-07-09: 23:00:00 via INTRAVENOUS

## 2016-07-09 MED ORDER — SODIUM CHLORIDE 0.9 % IV BOLUS (SEPSIS)
500.0000 mL | Freq: Once | INTRAVENOUS | Status: AC
  Administered 2016-07-09: 500 mL via INTRAVENOUS

## 2016-07-09 MED ORDER — SODIUM CHLORIDE 0.9 % IV SOLN: INTRAVENOUS | Status: DC

## 2016-07-09 MED ORDER — POTASSIUM CHLORIDE 10 MEQ/100ML IV SOLN: 10.0000 meq | INTRAVENOUS | Status: DC

## 2016-07-09 NOTE — ED Notes (Signed)
No respiratory or acute distress noted resting in bed with eyes closed call light in reach. 

## 2016-07-09 NOTE — ED Notes (Signed)
Gave pt warm blanket. 

## 2016-07-09 NOTE — ED Notes (Signed)
Bed: [id number] Expected date:  Expected time:  Means of arrival:  Comments: 75 yo ETOH-fall, rib pain

## 2016-07-09 NOTE — ED Notes (Signed)
No respiratory or acute distress noted alert and talking call light in reach c/o left side pain, in c collar.

## 2016-07-09 NOTE — ED Notes (Signed)
No respiratory or acute distress noted alert and talking call light in reach. 
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
## 2016-07-09 NOTE — ED Notes (Signed)
Cleaned up pt due to urinating all over self and covers no respiratory or acute distress noted alert and talking confused at times call light in reach.

## 2016-07-09 NOTE — ED Provider Notes (Signed)
[facility] DEPT Provider Note   CSN: [id number] Arrival date & time: 07/09/16  1914     History   Chief Complaint Chief Complaint  Patient presents with  . Fall  . Alcohol Intoxication    HPI [name] is a 75 y.o. male.  He presents for evaluation of fall, known is here to use history. He presents by EMS with a collar on.  Level V caveat- altered mental status  HPI  Past Medical History:  Diagnosis Date  . Alcohol abuse   . Seizures ([place])     There are no active problems to display for this patient.   History reviewed. No pertinent surgical history.     Home Medications    Prior to Admission medications   Medication Sig Start Date End Date Taking? Authorizing Provider  oxyCODONE-acetaminophen (PERCOCET/ROXICET) 5-325 MG tablet Take 1 tablet by mouth every 4 (four) hours as needed for severe pain. Patient not taking: Reported on 05/11/2016 02/28/16   Okey Regal, PA-C  phenytoin (DILANTIN) 200 MG ER capsule Take 200 mg by mouth 3 (three) times daily as needed (seizures).     Historical Provider, MD    Family History History reviewed. No pertinent family history.  Social History Social History  Substance Use Topics  . Smoking status: Current Every Day Smoker  . Smokeless tobacco: Never Used  . Alcohol use 3.6 oz/week    6 Cans of beer per week     Allergies   Review of patient's allergies indicates no known allergies.   Review of Systems Review of Systems  Unable to perform ROS: Mental status change     Physical Exam Updated Vital Signs BP 126/78 (BP Location: Right Arm)   Pulse 76   Temp 97.5 F (36.4 C) (Oral)   Resp 20   Ht 5\' 10"  (1.778 m)   Wt 141 lb (64 kg)   SpO2 93%   BMI 20.23 kg/m   Physical Exam  Constitutional: He appears well-developed.  Frail, elderly  HENT:  Head: Normocephalic.  Right Ear: External ear normal.  Left Ear: External ear normal.  No visualized injury to cranium. Her scalp.  Eyes:  Conjunctivae and EOM are normal. Pupils are equal, round, and reactive to light.  Neck: Normal range of motion and phonation normal. Neck supple.  Cardiovascular: Normal rate, regular rhythm and normal heart sounds.   Pulmonary/Chest: Effort normal and breath sounds normal. He exhibits no bony tenderness.  Abdominal: Soft. There is no tenderness.  Musculoskeletal: He exhibits no edema.  Wearing a hard cervical collar.  Neurological: He is alert. No cranial nerve deficit or sensory deficit. He exhibits normal muscle tone. Coordination normal.  Conversant, confused.  Skin: Skin is warm, dry and intact.  Psychiatric: He has a normal mood and affect. His behavior is normal.  Nursing note and vitals reviewed.    ED Treatments / Results  Labs (all labs ordered are listed, but only abnormal results are displayed) Labs Reviewed  BASIC METABOLIC PANEL - Abnormal; Notable for the following:       Result Value   Sodium 125 (*)    Chloride 92 (*)    Glucose, Bld 115 (*)    Calcium 8.1 (*)    All other components within normal limits  CBC WITH DIFFERENTIAL/PLATELET - Abnormal; Notable for the following:    WBC 3.7 (*)    RBC 4.04 (*)    Hemoglobin 12.3 (*)    HCT 33.6 (*)    MCHC 36.6 (*)  All other components within normal limits  ETHANOL - Abnormal; Notable for the following:    Alcohol, Ethyl (B) 292 (*)    All other components within normal limits  PROTIME-INR    EKG  EKG Interpretation None       Radiology No results found.  Procedures Procedures (including critical care time)  Medications Ordered in ED Medications - No data to display   Initial Impression / Assessment and Plan / ED Course  I have reviewed the triage vital signs and the nursing notes.  Pertinent labs & imaging results that were available during my care of the patient were reviewed by me and considered in my medical decision making (see chart for details).  Clinical Course  Comment By Time    Patient's nurse got additional information from EMS, that the patient's girlfriend was concerned that he injured his left ribs. She told EMS that she was not coming to the hospital tonight. Daleen Bo, MD 09/10 2200    Medications - No data to display  Patient Vitals for the past 24 hrs:  BP Temp Temp src Pulse Resp SpO2 Height Weight  07/09/16 1926 126/78 97.5 F (36.4 C) Oral 76 20 93 % - -  07/09/16 1925 - - - - - - 5\' 10"  (1.778 m) 141 lb (64 kg)    11:09 PM Reevaluation with update and discussion. After initial assessment and treatment, an updated evaluation reveals No change in clinical status. He remains intoxicated and confused. Riely Oetken L   11:09 PM-Consult complete with hospitalist. Patient case explained and discussed. He agrees to admit patient for further evaluation and treatment. Call ended at 23:14  Final Clinical Impressions(s) / ED Diagnoses   Final diagnoses:  Alcohol intoxication, with unspecified complication ([place])  Fall, initial encounter  Hyponatremia  Rib fracture, left, closed, initial encounter    Fall with alcohol intoxication. Injuries, left rib fractures without suspected visceral or intrapleural injuries. Incidental hyponatremia, which may have contributed to the fall, indirectly. Low Dilantin level. However, it appears that he has been treated in the past only for alcohol withdrawal seizures. It is unlikely that he is taking Dilantin, or needing it for an epilepsy condition. He will require admission for treatment of his multiple problems and stabilization prior to discharge.  Nursing Notes Reviewed/ Care Coordinated Applicable Imaging Reviewed Interpretation of Laboratory Data incorporated into ED treatment  Plan: Admit   New Prescriptions New Prescriptions   No medications on file     Daleen Bo, MD 07/09/16 2316

## 2016-07-10 ENCOUNTER — Observation Stay (HOSPITAL_COMMUNITY): Payer: Medicare HMO

## 2016-07-10 ENCOUNTER — Observation Stay (HOSPITAL_BASED_OUTPATIENT_CLINIC_OR_DEPARTMENT_OTHER)
Admit: 2016-07-10 | Discharge: 2016-07-10 | Disposition: A | Discharge status: Home / Self Care | Payer: Medicare HMO | Attending: Internal Medicine | Admitting: Internal Medicine

## 2016-07-10 ENCOUNTER — Encounter (HOSPITAL_COMMUNITY): Payer: Self-pay | Admitting: Internal Medicine

## 2016-07-10 DIAGNOSIS — F1010 Alcohol abuse, uncomplicated: Secondary | ICD-10-CM | POA: Diagnosis not present

## 2016-07-10 DIAGNOSIS — E43 Unspecified severe protein-calorie malnutrition: Secondary | ICD-10-CM | POA: Diagnosis present

## 2016-07-10 DIAGNOSIS — W19XXXS Unspecified fall, sequela: Secondary | ICD-10-CM

## 2016-07-10 DIAGNOSIS — G40909 Epilepsy, unspecified, not intractable, without status epilepticus: Secondary | ICD-10-CM | POA: Diagnosis present

## 2016-07-10 DIAGNOSIS — Z8249 Family history of ischemic heart disease and other diseases of the circulatory system: Secondary | ICD-10-CM | POA: Diagnosis not present

## 2016-07-10 DIAGNOSIS — F10229 Alcohol dependence with intoxication, unspecified: Secondary | ICD-10-CM | POA: Diagnosis present

## 2016-07-10 DIAGNOSIS — F10929 Alcohol use, unspecified with intoxication, unspecified: Secondary | ICD-10-CM

## 2016-07-10 DIAGNOSIS — R4182 Altered mental status, unspecified: Secondary | ICD-10-CM | POA: Diagnosis not present

## 2016-07-10 DIAGNOSIS — G9341 Metabolic encephalopathy: Secondary | ICD-10-CM | POA: Diagnosis present

## 2016-07-10 DIAGNOSIS — D649 Anemia, unspecified: Secondary | ICD-10-CM | POA: Diagnosis present

## 2016-07-10 DIAGNOSIS — S2231XS Fracture of one rib, right side, sequela: Secondary | ICD-10-CM

## 2016-07-10 DIAGNOSIS — S2239XA Fracture of one rib, unspecified side, initial encounter for closed fracture: Secondary | ICD-10-CM

## 2016-07-10 DIAGNOSIS — D72819 Decreased white blood cell count, unspecified: Secondary | ICD-10-CM | POA: Diagnosis present

## 2016-07-10 DIAGNOSIS — F10120 Alcohol abuse with intoxication, uncomplicated: Secondary | ICD-10-CM | POA: Diagnosis not present

## 2016-07-10 DIAGNOSIS — S2242XA Multiple fractures of ribs, left side, initial encounter for closed fracture: Secondary | ICD-10-CM | POA: Diagnosis present

## 2016-07-10 DIAGNOSIS — F10129 Alcohol abuse with intoxication, unspecified: Secondary | ICD-10-CM

## 2016-07-10 DIAGNOSIS — W19XXXA Unspecified fall, initial encounter: Secondary | ICD-10-CM | POA: Diagnosis present

## 2016-07-10 DIAGNOSIS — F17200 Nicotine dependence, unspecified, uncomplicated: Secondary | ICD-10-CM | POA: Diagnosis present

## 2016-07-10 DIAGNOSIS — Y908 Blood alcohol level of 240 mg/100 ml or more: Secondary | ICD-10-CM | POA: Diagnosis present

## 2016-07-10 DIAGNOSIS — S2232XS Fracture of one rib, left side, sequela: Secondary | ICD-10-CM | POA: Diagnosis not present

## 2016-07-10 DIAGNOSIS — E871 Hypo-osmolality and hyponatremia: Secondary | ICD-10-CM | POA: Diagnosis present

## 2016-07-10 DIAGNOSIS — Y92009 Unspecified place in unspecified non-institutional (private) residence as the place of occurrence of the external cause: Secondary | ICD-10-CM | POA: Diagnosis not present

## 2016-07-10 DIAGNOSIS — J8410 Pulmonary fibrosis, unspecified: Secondary | ICD-10-CM | POA: Diagnosis present

## 2016-07-10 LAB — CBC WITH DIFFERENTIAL/PLATELET
BASOS PCT: 1 %
Basophils Absolute: 0 10*3/uL (ref 0.0–0.1)
EOS PCT: 2 %
Eosinophils Absolute: 0.1 10*3/uL (ref 0.0–0.7)
HCT: 34.1 % — ABNORMAL LOW (ref 39.0–52.0)
Hemoglobin: 12.8 g/dL — ABNORMAL LOW (ref 13.0–17.0)
LYMPHS ABS: 0.8 10*3/uL (ref 0.7–4.0)
Lymphocytes Relative: 24 %
MCH: 30.9 pg (ref 26.0–34.0)
MCHC: 37.5 g/dL — ABNORMAL HIGH (ref 30.0–36.0)
MCV: 82.4 fL (ref 78.0–100.0)
MONO ABS: 0.6 10*3/uL (ref 0.1–1.0)
Monocytes Relative: 18 %
Neutro Abs: 2 10*3/uL (ref 1.7–7.7)
Neutrophils Relative %: 55 %
PLATELETS: 171 10*3/uL (ref 150–400)
RBC: 4.14 MIL/uL — ABNORMAL LOW (ref 4.22–5.81)
RDW: 14.5 % (ref 11.5–15.5)
WBC: 3.5 10*3/uL — ABNORMAL LOW (ref 4.0–10.5)

## 2016-07-10 LAB — COMPREHENSIVE METABOLIC PANEL
ALT: 9 U/L — AB (ref 17–63)
AST: 27 U/L (ref 15–41)
Albumin: 3.1 g/dL — ABNORMAL LOW (ref 3.5–5.0)
Alkaline Phosphatase: 64 U/L (ref 38–126)
Anion gap: 8 (ref 5–15)
CHLORIDE: 99 mmol/L — AB (ref 101–111)
CO2: 22 mmol/L (ref 22–32)
CREATININE: 0.5 mg/dL — AB (ref 0.61–1.24)
Calcium: 7.6 mg/dL — ABNORMAL LOW (ref 8.9–10.3)
GFR calc Af Amer: >60 mL/min (ref >60)
GFR calc non Af Amer: >60 mL/min (ref >60)
GLUCOSE: 89 mg/dL (ref 65–99)
Potassium: 3.8 mmol/L (ref 3.5–5.1)
SODIUM: 129 mmol/L — AB (ref 135–145)
Total Bilirubin: 0.4 mg/dL (ref 0.3–1.2)
Total Protein: 6.3 g/dL — ABNORMAL LOW (ref 6.5–8.1)

## 2016-07-10 LAB — IRON AND TIBC
Iron: 73 ug/dL (ref 45–182)
SATURATION RATIOS: 33 % (ref 17.9–39.5)
TIBC: 224 ug/dL — AB (ref 250–450)
UIBC: 151 ug/dL

## 2016-07-10 LAB — RETICULOCYTES
RBC.: 4.13 MIL/uL — ABNORMAL LOW (ref 4.22–5.81)
RETIC CT PCT: 1.5 % (ref 0.4–3.1)
Retic Count, Absolute: 62 10*3/uL (ref 19.0–186.0)

## 2016-07-10 LAB — AMMONIA: AMMONIA: 14 umol/L (ref 9–35)

## 2016-07-10 LAB — RAPID URINE DRUG SCREEN, HOSP PERFORMED
Amphetamines: NOT DETECTED
Barbiturates: NOT DETECTED
Benzodiazepines: NOT DETECTED
Cocaine: NOT DETECTED
OPIATES: NOT DETECTED
TETRAHYDROCANNABINOL: NOT DETECTED

## 2016-07-10 LAB — TSH: TSH: 2.799 u[IU]/mL (ref 0.350–4.500)

## 2016-07-10 LAB — TROPONIN I
Troponin I: <0.03 ng/mL (ref <0.03)
Troponin I: <0.03 ng/mL (ref <0.03)
Troponin I: <0.03 ng/mL (ref <0.03)

## 2016-07-10 LAB — OSMOLALITY, URINE: Osmolality, Ur: 328 mOsm/kg (ref 300–900)

## 2016-07-10 LAB — SODIUM, URINE, RANDOM: Sodium, Ur: 128 mmol/L

## 2016-07-10 LAB — CK: CK TOTAL: 271 U/L (ref 49–397)

## 2016-07-10 LAB — VITAMIN B12: VITAMIN B 12: 510 pg/mL (ref 180–914)

## 2016-07-10 LAB — FERRITIN: Ferritin: 120 ng/mL (ref 24–336)

## 2016-07-10 LAB — FOLATE: Folate: 11.1 ng/mL (ref >5.9)

## 2016-07-10 MED ORDER — LORAZEPAM 1 MG PO TABS: 1.0000 mg | ORAL_TABLET | Freq: Four times a day (QID) | ORAL | Status: DC | PRN

## 2016-07-10 MED ORDER — SODIUM CHLORIDE 0.9 % IV SOLN
INTRAVENOUS | Status: DC
  Administered 2016-07-10: 01:00:00 via INTRAVENOUS

## 2016-07-10 MED ORDER — LORAZEPAM 2 MG/ML IJ SOLN
1.0000 mg | Freq: Four times a day (QID) | INTRAMUSCULAR | Status: DC | PRN
Start: 2016-07-10 — End: 2016-07-12
  Administered 2016-07-10: 1 mg via INTRAVENOUS
  Administered 2016-07-10: 2 mg via INTRAVENOUS
  Filled 2016-07-10 (×2): qty 1

## 2016-07-10 MED ORDER — IOPAMIDOL (ISOVUE-300) INJECTION 61%
100.0000 mL | Freq: Once | INTRAVENOUS | Status: AC | PRN
  Administered 2016-07-10: 100 mL via INTRAVENOUS

## 2016-07-10 MED ORDER — ENSURE ENLIVE PO LIQD
237.0000 mL | Freq: Two times a day (BID) | ORAL | Status: DC
  Administered 2016-07-13 – 2016-07-14 (×4): 237 mL via ORAL

## 2016-07-10 MED ORDER — VITAMIN B-1 100 MG PO TABS
100.0000 mg | ORAL_TABLET | Freq: Every day | ORAL | Status: DC
  Administered 2016-07-10 – 2016-07-15 (×3): 100 mg via ORAL
  Filled 2016-07-10 (×3): qty 1

## 2016-07-10 MED ORDER — LORAZEPAM 2 MG/ML IJ SOLN: 2.0000 mg | Freq: Once | INTRAMUSCULAR | Status: AC

## 2016-07-10 MED ORDER — SODIUM CHLORIDE 0.9 % IV SOLN
INTRAVENOUS | Status: DC
  Administered 2016-07-10 (×2): via INTRAVENOUS

## 2016-07-10 MED ORDER — LORAZEPAM 1 MG PO TABS
0.0000 mg | ORAL_TABLET | Freq: Four times a day (QID) | ORAL | Status: DC
  Administered 2016-07-10 – 2016-07-11 (×2): 2 mg via ORAL
  Filled 2016-07-10 (×3): qty 2

## 2016-07-10 MED ORDER — ADULT MULTIVITAMIN W/MINERALS CH
1.0000 | ORAL_TABLET | Freq: Every day | ORAL | Status: DC
  Administered 2016-07-10: 1 via ORAL
  Filled 2016-07-10 (×2): qty 1

## 2016-07-10 MED ORDER — ONDANSETRON HCL 4 MG/2ML IJ SOLN: 4.0000 mg | Freq: Four times a day (QID) | INTRAMUSCULAR | Status: DC | PRN

## 2016-07-10 MED ORDER — THIAMINE HCL 100 MG/ML IJ SOLN
100.0000 mg | Freq: Every day | INTRAMUSCULAR | Status: DC
  Administered 2016-07-12 – 2016-07-13 (×2): 100 mg via INTRAVENOUS
  Filled 2016-07-10 (×2): qty 2

## 2016-07-10 MED ORDER — ONDANSETRON HCL 4 MG PO TABS: 4.0000 mg | ORAL_TABLET | Freq: Four times a day (QID) | ORAL | Status: DC | PRN

## 2016-07-10 MED ORDER — LORAZEPAM 1 MG PO TABS: 0.0000 mg | ORAL_TABLET | Freq: Two times a day (BID) | ORAL | Status: DC

## 2016-07-10 MED ORDER — PHENYTOIN SODIUM EXTENDED 100 MG PO CAPS
200.0000 mg | ORAL_CAPSULE | Freq: Three times a day (TID) | ORAL | Status: DC
  Administered 2016-07-10: 200 mg via ORAL
  Filled 2016-07-10 (×2): qty 2

## 2016-07-10 MED ORDER — SODIUM CHLORIDE 0.9 % IV SOLN
1000.0000 mg | Freq: Once | INTRAVENOUS | Status: AC
  Administered 2016-07-10: 1000 mg via INTRAVENOUS
  Filled 2016-07-10: qty 20

## 2016-07-10 MED ORDER — FOLIC ACID 1 MG PO TABS
1.0000 mg | ORAL_TABLET | Freq: Every day | ORAL | Status: DC
  Administered 2016-07-10 – 2016-07-15 (×5): 1 mg via ORAL
  Filled 2016-07-10 (×5): qty 1

## 2016-07-10 NOTE — Significant Event (Signed)
Rapid Response Event Note  Overview:      Initial Focused Assessment:   Interventions:  Plan of Care (if not transferred):  Event Summary: RRT called for seizure activity. PT stood patient to walk and he became ridgid and eyes rolled back. They quickly lowered him to a side laying position in the bed. He vomitted a large amount of brownish fluid. Once RRT arrived patient was more alert and able to squeeze bilateral hands on command. PERL 2 mm, sluggish. Ativan 2 mg IVP given.  SBP 120-127/55-90, NSR 80's, RR 20, Sats 98% on RA. Dr Charlies Silvers paged and aware.Orders received.   at      at          [place], [place]

## 2016-07-10 NOTE — Progress Notes (Signed)
EEG completed, results pending. 

## 2016-07-10 NOTE — Care Management Note (Signed)
Case Management Note  Patient Details  Name: [name] MRN: [id number] Date of Birth: [birth year]  Subjective/Objective:  75 y/o m admitted w/hyponatremia, etoh. S/p seizure-rapid response called.From  Home. PT-recc SNF.                  Action/Plan:d/c plan SNF.   Expected Discharge Date:   (unknown)               Expected Discharge Plan:  Skilled Nursing Facility  In-House Referral:  Clinical Social Work  Discharge planning Services  CM Consult  Post Acute Care Choice:    Choice offered to:     DME Arranged:    DME Agency:     HH Arranged:    [place] Agency:     Status of Service:  In process, will continue to follow  If discussed at Long Length of Stay Meetings, dates discussed:    Additional Comments:  Dessa Phi, RN 07/10/2016, 4:41 PM

## 2016-07-10 NOTE — Procedures (Signed)
ELECTROENCEPHALOGRAM REPORT  Date of Study: 07/10/2016  Patient's Name: [name] MRN: [id number] Date of Birth: [birth year]  Referring Provider: Gean Birchwood, MD  Clinical History: 75 year old man with history of seizures and alcohol abuse presents status post fall and memory loss.  Medications: Hospital Medications  0.9 % sodium chloride infusion   feeding supplement (ENSURE ENLIVE) (ENSURE ENLIVE) liquid 123XX123 mL   folic acid (FOLVITE) tablet 1 mg  L1 LORazepam (ATIVAN) injection 1 mg  L2 LORazepam (ATIVAN) tablet 0-4 mg  L2 LORazepam (ATIVAN) tablet 0-4 mg  L1 LORazepam (ATIVAN) tablet 1 mg   multivitamin with minerals tablet 1 tablet  L3 ondansetron (ZOFRAN) injection 4 mg  L3 ondansetron (ZOFRAN) tablet 4 mg   phenytoin (DILANTIN) ER capsule 200 mg  L4 thiamine (B-1) injection 100 mg  L4 thiamine (VITAMIN B-1) tablet 100 mg  Technical Summary: A multichannel digital EEG recording measured by the international 10-20 system with electrodes applied with paste and impedances below 5000 ohms performed as portable with EKG monitoring in an awake patient.  Hyperventilation and photic stimulation were not performed.  The digital EEG was referentially recorded, reformatted, and digitally filtered in a variety of bipolar and referential montages for optimal display.   Description: The patient is awake during the recording.  During maximal wakefulness, there is a symmetric, medium voltage 8 Hz posterior dominant rhythm that attenuates with eye opening. This is admixed with a small amount of diffuse 4-5 Hz theta and 2-3 Hz delta slowing of the waking background.  There were no epileptiform discharges or electrographic seizures seen.    EKG difficult to interpret due to movement artifact  Impression: This awake EEG is mildly abnormal due to mildly diffuse slowing of the waking background.  Clinical Correlation of the above findings indicates diffuse cerebral dysfunction that is  non-specific in etiology and can be seen with hypoxic/ischemic injury, toxic/metabolic encephalopathies, neurodegenerative disorders, or medication effect.  The absence of epileptiform discharges does not rule out a clinical diagnosis of epilepsy.  Clinical correlation is advised.   Metta Clines, DO

## 2016-07-10 NOTE — Care Management Obs Status (Signed)
[facility] NOTIFICATION   Patient Details  Name: [name] MRN: [id number] Date of Birth: [birth year]   Medicare Observation Status Notification Given:  Yes    MahabirJuliann Pulse, RN 07/10/2016, 4:40 PM

## 2016-07-10 NOTE — H&P (Addendum)
History and Physical    [name] [name] DOB: [birth year] DOA: 07/09/2016  PCP: Odis Luster, MD  Patient coming from: Home.  History obtained from ER physician as patient is intoxicated with alcohol.  Chief Complaint: Fall.  HPI: [name] is a 75 y.o. male with alcoholism and alcohol-related seizures was brought to the ER the patient had a fall at his home. No further history available since patient is intoxicated and unable to reach patient's friend with whom patient lives. Patient at this time is oriented to his name and follows commands and moves all extremities but still intoxicated. Patient unable to tell how he fell and who brought him here. CT head and neck are unremarkable. Chest x-ray shows fractures of the 10th of 11th and 12th ribs.  ED Course: CT head and neck was unremarkable. Labs show hyponatremia. Patient was started on IV fluids. Alcohol levels are positive.  Review of Systems: As per HPI, rest all negative.   Past Medical History:  Diagnosis Date  . Alcohol abuse   . Seizures ([place])     History reviewed. No pertinent surgical history.   reports that he has been smoking.  He has never used smokeless tobacco. He reports that he drinks about 3.6 oz of alcohol per week . He reports that he does not use drugs.  No Known Allergies  Family History  Problem Relation Age of Onset  . Hypertension Other     Prior to Admission medications   Medication Sig Start Date End Date Taking? Authorizing Provider  oxyCODONE-acetaminophen (PERCOCET/ROXICET) 5-325 MG tablet Take 1 tablet by mouth every 4 (four) hours as needed for severe pain. Patient not taking: Reported on 05/11/2016 02/28/16   Okey Regal, PA-C  phenytoin (DILANTIN) 200 MG ER capsule Take 200 mg by mouth 3 (three) times daily as needed (seizures).     Historical Provider, MD    Physical Exam: Vitals:   07/09/16 1926 07/09/16 2305 07/10/16 0038 07/10/16 0038  BP: 126/78 116/79 122/78  122/78  Pulse: 76 76 74 75  Resp: 20 18  16   Temp: 97.5 F (36.4 C) 97.3 F (36.3 C)  97.2 F (36.2 C)  TempSrc: Oral Oral  Oral  SpO2: 93% 96%  97%  Weight:      Height:          Constitutional: Not in distress. Vitals:   07/09/16 1926 07/09/16 2305 07/10/16 0038 07/10/16 0038  BP: 126/78 116/79 122/78 122/78  Pulse: 76 76 74 75  Resp: 20 18  16   Temp: 97.5 F (36.4 C) 97.3 F (36.3 C)  97.2 F (36.2 C)  TempSrc: Oral Oral  Oral  SpO2: 93% 96%  97%  Weight:      Height:       Eyes: Anicteric no pallor. ENMT: No discharge from the ears eyes nose and mouth. Neck: Neck collar in place. Respiratory: No rhonchi or crepitations. Cardiovascular: S1 and S2 heard. Abdomen: Soft nontender bowel sounds present. No guarding or rigidity. Musculoskeletal: No edema. Skin: No rash. Neurologic: Alert awake only oriented to his name. Moves all extremities. Psychiatric: Patient is intoxicated with alcohol.   Labs on Admission: I have personally reviewed following labs and imaging studies  CBC:  Recent Labs Lab 07/09/16 2003  WBC 3.7*  NEUTROABS 2.0  HGB 12.3*  HCT 33.6*  MCV 83.2  PLT 123XX123   Basic Metabolic Panel:  Recent Labs Lab 07/09/16 2003  NA 125*  K 3.5  CL 92*  CO2 25  GLUCOSE 115*  BUN 7  CREATININE 0.63  CALCIUM 8.1*   GFR: Estimated Creatinine Clearance: 71.1 mL/min (by C-G formula based on SCr of 0.8 mg/dL). Liver Function Tests: No results for input(s): AST, ALT, ALKPHOS, BILITOT, PROT, ALBUMIN in the last 168 hours. No results for input(s): LIPASE, AMYLASE in the last 168 hours. No results for input(s): AMMONIA in the last 168 hours. Coagulation Profile:  Recent Labs Lab 07/09/16 2003  INR 1.01   Cardiac Enzymes: No results for input(s): CKTOTAL, CKMB, CKMBINDEX, TROPONINI in the last 168 hours. BNP (last 3 results) No results for input(s): PROBNP in the last 8760 hours. HbA1C: No results for input(s): HGBA1C in the last 72  hours. CBG: No results for input(s): GLUCAP in the last 168 hours. Lipid Profile: No results for input(s): CHOL, HDL, LDLCALC, TRIG, CHOLHDL, LDLDIRECT in the last 72 hours. Thyroid Function Tests: No results for input(s): TSH, T4TOTAL, FREET4, T3FREE, THYROIDAB in the last 72 hours. Anemia Panel: No results for input(s): VITAMINB12, FOLATE, FERRITIN, TIBC, IRON, RETICCTPCT in the last 72 hours. Urine analysis:    Component Value Date/Time   COLORURINE YELLOW 07/14/2015 [place] 07/14/2015 1352   LABSPEC 1.015 07/14/2015 1352   PHURINE 7.5 07/14/2015 1352   GLUCOSEU NEGATIVE 07/14/2015 1352   HGBUR NEGATIVE 07/14/2015 1352   BILIRUBINUR NEGATIVE 07/14/2015 1352   KETONESUR NEGATIVE 07/14/2015 1352   PROTEINUR NEGATIVE 07/14/2015 1352   UROBILINOGEN 2.0 (H) 07/14/2015 1352   NITRITE NEGATIVE 07/14/2015 1352   LEUKOCYTESUR NEGATIVE 07/14/2015 1352   Sepsis Labs: @LABRCNTIP (procalcitonin:4,lacticidven:4) )No results found for this or any previous visit (from the past 240 hour(s)).   Radiological Exams on Admission: Dg Ribs Unilateral W/chest Left  Result Date: 07/09/2016 CLINICAL DATA:  Pain.  Fall at home. EXAM: LEFT RIBS AND CHEST - 3+ VIEW COMPARISON:  Chest radiograph 08/18/2008 FINDINGS: Acute minimally displaced fractures posterior lateral left twelfth rib and anterior lateral tenth rib. Nondisplaced fracture of anterior lateral eleventh rib is likely acute. Remote nondisplaced fractures of the upper anterior lateral ribs are stable from prior chest radiograph. No pneumothorax. Emphysematous change. Ill-defined interstitial opacity at the lung bases suspicious for fibrosis. No large pleural effusion. Heart size is normal. Mild tortuosity of the thoracic aorta. IMPRESSION: 1. Acute fractures of anterior lateral tenth, eleventh, and twelfth ribs, tenth and twelfth rib fractures are minimally displaced. No pneumothorax. 2. Emphysema. Interstitial prominence at the  lung bases, left greater than right, may be fibrosis. Electronically Signed   By: Jeb Levering M.D.   On: 07/09/2016 22:41   Ct Head Wo Contrast  Result Date: 07/09/2016 CLINICAL DATA:  Intoxication and fall at home.  Initial encounter. EXAM: CT HEAD WITHOUT CONTRAST CT CERVICAL SPINE WITHOUT CONTRAST TECHNIQUE: Multidetector CT imaging of the head and cervical spine was performed following the standard protocol without intravenous contrast. Multiplanar CT image reconstructions of the cervical spine were also generated. COMPARISON:  07/14/2015 head CT FINDINGS: CT HEAD FINDINGS Brain: No evidence of acute infarction, hemorrhage, hydrocephalus, extra-axial collection or mass lesion/mass effect. Advanced cortical atrophy with parietal predilection. Vascular: Atherosclerotic calcification. Skull: Negative for fracture or focal lesion. Sinuses/Orbits: Remote blowout fracture of the medial wall right orbit. Other: None CT CERVICAL SPINE FINDINGS Alignment: No traumatic malalignment Skull base and vertebrae: No evidence of acute fracture. Soft tissues and spinal canal: No prevertebral fluid or swelling. No visible canal hematoma. Disc levels: Diffuse endplate irregularity and disc calcification. Upper cervical facet arthropathy with erosive features at C2-3. Upper  chest: Fibrotic and emphysematous changes in the apical lungs. IMPRESSION: 1. No evidence of acute intracranial or cervical spine injury. 2. Advanced cortical atrophy with parietal predilection. 3.  Emphysema. [id number]) Electronically Signed   By: Monte Fantasia M.D.   On: 07/09/2016 20:57   Ct Cervical Spine Wo Contrast  Result Date: 07/09/2016 CLINICAL DATA:  Intoxication and fall at home.  Initial encounter. EXAM: CT HEAD WITHOUT CONTRAST CT CERVICAL SPINE WITHOUT CONTRAST TECHNIQUE: Multidetector CT imaging of the head and cervical spine was performed following the standard protocol without intravenous contrast. Multiplanar CT image  reconstructions of the cervical spine were also generated. COMPARISON:  07/14/2015 head CT FINDINGS: CT HEAD FINDINGS Brain: No evidence of acute infarction, hemorrhage, hydrocephalus, extra-axial collection or mass lesion/mass effect. Advanced cortical atrophy with parietal predilection. Vascular: Atherosclerotic calcification. Skull: Negative for fracture or focal lesion. Sinuses/Orbits: Remote blowout fracture of the medial wall right orbit. Other: None CT CERVICAL SPINE FINDINGS Alignment: No traumatic malalignment Skull base and vertebrae: No evidence of acute fracture. Soft tissues and spinal canal: No prevertebral fluid or swelling. No visible canal hematoma. Disc levels: Diffuse endplate irregularity and disc calcification. Upper cervical facet arthropathy with erosive features at C2-3. Upper chest: Fibrotic and emphysematous changes in the apical lungs. IMPRESSION: 1. No evidence of acute intracranial or cervical spine injury. 2. Advanced cortical atrophy with parietal predilection. 3.  Emphysema. [id number]) Electronically Signed   By: Monte Fantasia M.D.   On: 07/09/2016 20:57     Assessment/Plan Principal Problem:   Hyponatremia Active Problems:   Rib fracture   Alcohol intoxication ([place])   Fall    1. Fall with alcohol intoxication with rib fractures on the left side - at this time patient is placed on CIWA protocol and closely monitored in telemetry. EKG is pending. Not sure if patient had a seizure. Discussed with neurologist who advised patient to be placed back on Dilantin with loading dose. Check EEG. Get physical therapy consult. Check ammonia levels. 2. Rib fractures after fall - discussed trauma service will be seeing patient in consult. Recheck chest x-ray in a.m. 3. Chronic anemia - check anemia panel follow CBC. 4. History of alcohol-related seizure - patient is placed back on Dilantin after discussing with neurologist. 5. Hyponatremia - probably related to alcoholism but  dehydration could also be contributing. I have sent urine for sodium and Osmolality. Closely follow metabolic panel. For now we will gently hydrate. Further plans based on urine studies and metabolic panel trends. 6. Mild leukopenia probably from alcoholism - check HIV status follow CBC.  EKG, LFTs and x-ray pelvis are pending.   DVT prophylaxis: SCDs. Code Status: Full code.  Family Communication: Unable to reach family.  Disposition Plan: To be determined.  Consults called: Trauma service and neurology.  Admission status: Observation.    Rise Patience MD [facility] Pager [phone].  If 7PM-7AM, please contact night-coverage [email or web address] Password [email or web address]  07/10/2016, 2:27 AM

## 2016-07-10 NOTE — Care Management Note (Signed)
Case Management Note  Patient Details  Name: [name] MRN: [id number] Date of Birth: [birth year]  Subjective/Objective:    75 y/o m admitted w/hyponatremia. From home.                Action/Plan:d/c plan home.   Expected Discharge Date:   (unknown)               Expected Discharge Plan:  Home/Self Care  In-House Referral:     Discharge planning Services  CM Consult  Post Acute Care Choice:    Choice offered to:     DME Arranged:    DME Agency:     HH Arranged:    HH Agency:     Status of Service:  In process, will continue to follow  If discussed at Long Length of Stay Meetings, dates discussed:    Additional Comments:  Dessa Phi, RN 07/10/2016, 12:57 PM

## 2016-07-10 NOTE — Progress Notes (Signed)
Patient ID: [name], male   DOB: 12-23-1940, 75 y.o.   MRN: [id number]  Patient admitted after midnight. For details please refer to admission note done 07/10/2016. Patient seen and examined at bedside.  75 y.o. male with past medical history of alcoholism and alcohol-related seizures who presented to [facility] long hospital intoxicated. He apparently fell which brought him to ED. CT head and neck done on admission was unremarkable. Chest x-ray showed fractures of the 10th, 11th and 12th ribs. His blood work was notable for hyponatremia. Alcohol level was 292.  Assessment and plan:  Fall / Rib fractures - Appreciate PT evaluation  Acute alcohol intoxication - Continue CIWa protocol  Leisa Lenz [facility] [id number]

## 2016-07-10 NOTE — Evaluation (Signed)
Physical Therapy Evaluation Patient Details Name: [name] MRN: [id number] DOB: 01-02-1941 Today's Date: 07/10/2016   History of Present Illness  75 y.o. male with alcoholism and alcohol-related seizures and admitted with fall with alcohol intoxication with rib fractures on the left side. Chest x-ray shows fractures of the 10th of 11th and 12th ribs.  Clinical Impression  Pt admitted with above diagnosis. Pt currently with functional limitations due to the deficits listed below (see PT Problem List).  Pt will benefit from skilled PT to increase their independence and safety with mobility to allow discharge to the venue listed below.   Pt assisted OOB and ambulated very short distance with increased assist and requiring multimodal cues.  Recommend SNF upon d/c.  Upon return to sitting, pt went into stiff full body extension posture, having seizure.  RN alerted.  Pt rolled onto his side due to emesis and RN into room to assist with repositioning fully in bed.  Pt left with nursing staff.     Follow Up Recommendations SNF;Supervision/Assistance - 24 hour    Equipment Recommendations  Rolling walker with 5" wheels    Recommendations for Other Services       Precautions / Restrictions Precautions Precautions: Fall Precaution Comments: seizure      Mobility  Bed Mobility Overal bed mobility: Needs Assistance;+2 for physical assistance Bed Mobility: Supine to Sit;Sit to Supine     Supine to sit: Mod assist;+2 for physical assistance Sit to supine: +2 for physical assistance;Total assist   General bed mobility comments: multimodal cues getting to EOB, total assist upon return to supine due to seizure  Transfers Overall transfer level: Needs assistance Equipment used: Rolling walker (2 wheeled) Transfers: Sit to/from Stand Sit to Stand: Mod assist;+2 physical assistance         General transfer comment: multimodal cues for safe technique, assist to rise, steady and control  descent  Ambulation/Gait Ambulation/Gait assistance: Mod assist;+2 physical assistance Ambulation Distance (Feet): 15 Feet Assistive device: Rolling walker (2 wheeled) Gait Pattern/deviations: Step-through pattern;Shuffle;Drifts right/left;Trunk flexed;Narrow base of support;Decreased stride length     General Gait Details: very unsteady and uncoordinated gait, requiring assist for steadying, RW positioning/maneuvering, limited distance due to poor ability for pt to self assist  Stairs            Wheelchair Mobility    Modified Rankin (Stroke Patients Only)       Balance Overall balance assessment: History of Falls;Needs assistance (fall prior to arrival per chart )         Standing balance support: Bilateral upper extremity supported;During functional activity Standing balance-Leahy Scale: Zero                               Pertinent Vitals/Pain Pain Assessment: Faces Faces Pain Scale: Hurts little more Pain Location: ribs with supine to sit Pain Descriptors / Indicators: Grimacing;Guarding Pain Intervention(s): Limited activity within patient's tolerance;Monitored during session    Home Living Family/patient expects to be discharged to:: Private residence                 Additional Comments: pt poor historian    Prior Function           Comments: pt poor historian, seems he was likely ambulatory prior to arrival     Hand Dominance        Extremity/Trunk Assessment  Lower Extremity Assessment: Generalized weakness         Communication   Communication:  (little verbalizations)  Cognition Arousal/Alertness: Awake/alert Behavior During Therapy: Flat affect Overall Cognitive Status: Difficult to assess                      General Comments      Exercises        Assessment/Plan    PT Assessment Patient needs continued PT services  PT Diagnosis Difficulty walking;Generalized weakness    PT Problem List Decreased strength;Decreased activity tolerance;Decreased knowledge of use of DME;Decreased balance;Decreased cognition;Decreased mobility  PT Treatment Interventions DME instruction;Gait training;Functional mobility training;Balance training;Therapeutic activities;Therapeutic exercise;Patient/family education   PT Goals (Current goals can be found in the Care Plan section) Acute Rehab PT Goals PT Goal Formulation: With patient Time For Goal Achievement: 07/24/16 Potential to Achieve Goals: Good    Frequency Min 3X/week   Barriers to discharge        Co-evaluation               End of Session Equipment Utilized During Treatment: Gait belt Activity Tolerance: Other (comment) (limited by cognition, seizure) Patient left: with call bell/phone within reach;Other (comment);in bed (left with rapid response RN, RN, Agricultural consultant) Nurse Communication: Mobility status    Functional Assessment Tool Used: clinical judgement Functional Limitation: Mobility: Walking and moving around Mobility: Walking and Moving Around Current Status [phone]): At least 80 percent but less than 100 percent impaired, limited or restricted Mobility: Walking and Moving Around Goal Status [phone]): At least 20 percent but less than 40 percent impaired, limited or restricted    Time: 1454-1505 PT Time Calculation (min) (ACUTE ONLY): 11 min   Charges:   PT Evaluation $PT Eval Moderate Complexity: 1 Procedure     PT G Codes:   PT G-Codes **NOT FOR INPATIENT CLASS** Functional Assessment Tool Used: clinical judgement Functional Limitation: Mobility: Walking and moving around Mobility: Walking and Moving Around Current Status [id number] At least 80 percent but less than 100 percent impaired, limited or restricted Mobility: Walking and Moving Around Goal Status [phone]): At least 20 percent but less than 40 percent impaired, limited or restricted    Donivan Thammavong,KATHrine E 07/10/2016, 3:59 PM Carmelia Bake, PT, DPT 07/10/2016 Pager: [phone]

## 2016-07-10 NOTE — Consult Note (Signed)
[facility] Surgery Consult Note  [name] 04-04-1941  [id number].     Requesting MD: Dr. Gean Birchwood  Chief Complaint/Reason for Consult: Fall, rib fractures HPI:  75 y/o male with a history of alcohol abuse and alcohol-related seizure who presented to [facility] ED after a fall at home. Accurate history was not obtained at the time of admission secondary to intoxication. Patient was oriented to person, followed commands, and moved all extremities at presentation. He reports living in an apartment with his girlfriend. Ethanol level in ED was 292. CT head/neck unremarkable. CXR was significant for 3 left rib fractures and trauma was asked to consult.   Today during my exam the patient states that he does not remember coming to the hospital and that he did not know he was at [facility]. He does not remember falling. He is oriented to person, but not place or time. He denies HA, chest pain, SOB, abdominal pain, dysuria, hematuria, and weakness. He denies a PMH of HTN, DM2, MI, or CVA. He reports having seizures in the past. Denies a history of abdominal surgeries but states he has had surgery for a broken arm. He reports drinking beer daily but is unable to quantify the amount. Denies illicit drug use.   ROS: All systems reviewed and otherwise negative except for as above  Family History  Problem Relation Age of Onset  . Hypertension Other     Past Medical History:  Diagnosis Date  . Alcohol abuse   . Seizures ([place])     History reviewed. No pertinent surgical history.  Social History:  reports that he has been smoking.  He has never used smokeless tobacco. He reports that he drinks about 3.6 oz of alcohol per week . He reports that he does not use drugs.  Allergies: No Known Allergies  Medications Prior to Admission  Medication Sig Dispense Refill  . oxyCODONE-acetaminophen (PERCOCET/ROXICET) 5-325 MG tablet Take 1 tablet by mouth every 4 (four) hours as needed for severe  pain. (Patient not taking: Reported on 05/11/2016) 15 tablet 0  . phenytoin (DILANTIN) 200 MG ER capsule Take 200 mg by mouth 3 (three) times daily as needed (seizures).       Blood pressure 128/71, pulse 72, temperature 97.9 F (36.6 C), temperature source Oral, resp. rate 18, height _0  (1.651 m), weight 63.9 kg (140 lb 14 oz), SpO2 94 %. Physical Exam: General: alert, cachectic, unkempt AA male who is laying in bed in NAD - moving the head of the bed up causes him to wince in pain, when asked where the pain is his response is "everywhere". HEENT: head is normocephalic, atraumatic. Mouth is dry.  Heart: regular, rate, and rhythm.  No obvious murmurs, gallops, or rubs noted.  Palpable pedal pulses bilaterally Lungs:  TTP left lower ribcage. Respiratory effort nonlabored, clear to auscultation of right lung, crackles over left middle lobe, decreased breath sounds in BL lung bases. Abd: soft, TTP BL lower quadrants with guarding, +BS, no masses, hernias, or organomegaly, no peritonitis  MS: all 4 extremities are symmetrical with no cyanosis or edema Skin: warm and dry  Psych: appropriate affect Neuro: normal speech  Results for orders placed or performed during the hospital encounter of 07/09/16 (from the past 48 hour(s))  Basic metabolic panel     Status: Abnormal   Collection Time: 07/09/16  8:03 PM  Result Value Ref Range   Sodium 125 (L) 135 - 145 mmol/L   Potassium 3.5 3.5 - 5.1  mmol/L   Chloride 92 (L) 101 - 111 mmol/L   CO2 25 22 - 32 mmol/L   Glucose, Bld 115 (H) 65 - 99 mg/dL   BUN 7 6 - 20 mg/dL   Creatinine, Ser 0.63 0.61 - 1.24 mg/dL   Calcium 8.1 (L) 8.9 - 10.3 mg/dL   GFR calc non Af Amer >60 >60 mL/min   GFR calc Af Amer >60 >60 mL/min    Comment: (NOTE) The eGFR has been calculated using the CKD EPI equation. This calculation has not been validated in all clinical situations. eGFR's persistently <60 mL/min signify possible Chronic Kidney Disease.    Anion gap 8 5  - 15  CBC with Differential     Status: Abnormal   Collection Time: 07/09/16  8:03 PM  Result Value Ref Range   WBC 3.7 (L) 4.0 - 10.5 K/uL   RBC 4.04 (L) 4.22 - 5.81 MIL/uL   Hemoglobin 12.3 (L) 13.0 - 17.0 g/dL   HCT 33.6 (L) 39.0 - 52.0 %   MCV 83.2 78.0 - 100.0 fL   MCH 30.4 26.0 - 34.0 pg   MCHC 36.6 (H) 30.0 - 36.0 g/dL   RDW 14.5 11.5 - 15.5 %   Platelets 208 150 - 400 K/uL   Neutrophils Relative % 53 %   Neutro Abs 2.0 1.7 - 7.7 K/uL   Lymphocytes Relative 28 %   Lymphs Abs 1.1 0.7 - 4.0 K/uL   Monocytes Relative 15 %   Monocytes Absolute 0.6 0.1 - 1.0 K/uL   Eosinophils Relative 2 %   Eosinophils Absolute 0.1 0.0 - 0.7 K/uL   Basophils Relative 1 %   Basophils Absolute 0.1 0.0 - 0.1 K/uL  Ethanol     Status: Abnormal   Collection Time: 07/09/16  8:03 PM  Result Value Ref Range   Alcohol, Ethyl (B) 292 (H) <5 mg/dL    Comment:        LOWEST DETECTABLE LIMIT FOR SERUM ALCOHOL IS 5 mg/dL FOR MEDICAL PURPOSES ONLY   Protime-INR     Status: None   Collection Time: 07/09/16  8:03 PM  Result Value Ref Range   Prothrombin Time 13.3 11.4 - 15.2 seconds   INR 1.01   Phenytoin level, total     Status: Abnormal   Collection Time: 07/09/16  8:03 PM  Result Value Ref Range   Phenytoin Lvl <2.5 (L) 10.0 - 20.0 ug/mL    Comment: REPEATED TO VERIFY  Troponin I (q 6hr x 3)     Status: None   Collection Time: 07/10/16  4:54 AM  Result Value Ref Range   Troponin I <0.03 <0.03 ng/mL  CK     Status: None   Collection Time: 07/10/16  4:54 AM  Result Value Ref Range   Total CK 271 49 - 397 U/L  Comprehensive metabolic panel     Status: Abnormal   Collection Time: 07/10/16  4:54 AM  Result Value Ref Range   Sodium 129 (L) 135 - 145 mmol/L   Potassium 3.8 3.5 - 5.1 mmol/L   Chloride 99 (L) 101 - 111 mmol/L   CO2 22 22 - 32 mmol/L   Glucose, Bld 89 65 - 99 mg/dL   BUN <5 (L) 6 - 20 mg/dL   Creatinine, Ser 0.50 (L) 0.61 - 1.24 mg/dL   Calcium 7.6 (L) 8.9 - 10.3 mg/dL   Total  Protein 6.3 (L) 6.5 - 8.1 g/dL   Albumin 3.1 (L) 3.5 - 5.0 g/dL  AST 27 15 - 41 U/L   ALT 9 (L) 17 - 63 U/L   Alkaline Phosphatase 64 38 - 126 U/L   Total Bilirubin 0.4 0.3 - 1.2 mg/dL   GFR calc non Af Amer >60 >60 mL/min   GFR calc Af Amer >60 >60 mL/min    Comment: (NOTE) The eGFR has been calculated using the CKD EPI equation. This calculation has not been validated in all clinical situations. eGFR's persistently <60 mL/min signify possible Chronic Kidney Disease.    Anion gap 8 5 - 15  CBC WITH DIFFERENTIAL     Status: Abnormal   Collection Time: 07/10/16  4:54 AM  Result Value Ref Range   WBC 3.5 (L) 4.0 - 10.5 K/uL   RBC 4.14 (L) 4.22 - 5.81 MIL/uL   Hemoglobin 12.8 (L) 13.0 - 17.0 g/dL   HCT 34.1 (L) 39.0 - 52.0 %   MCV 82.4 78.0 - 100.0 fL   MCH 30.9 26.0 - 34.0 pg   MCHC 37.5 (H) 30.0 - 36.0 g/dL    Comment: RULED OUT INTERFERING SUBSTANCES   RDW 14.5 11.5 - 15.5 %   Platelets 171 150 - 400 K/uL   Neutrophils Relative % 55 %   Lymphocytes Relative 24 %   Monocytes Relative 18 %   Eosinophils Relative 2 %   Basophils Relative 1 %   Neutro Abs 2.0 1.7 - 7.7 K/uL   Lymphs Abs 0.8 0.7 - 4.0 K/uL   Monocytes Absolute 0.6 0.1 - 1.0 K/uL   Eosinophils Absolute 0.1 0.0 - 0.7 K/uL   Basophils Absolute 0.0 0.0 - 0.1 K/uL   RBC Morphology TARGET CELLS   TSH     Status: None   Collection Time: 07/10/16  4:54 AM  Result Value Ref Range   TSH 2.799 0.350 - 4.500 uIU/mL  Reticulocytes     Status: Abnormal   Collection Time: 07/10/16  4:54 AM  Result Value Ref Range   Retic Ct Pct 1.5 0.4 - 3.1 %   RBC. 4.13 (L) 4.22 - 5.81 MIL/uL   Retic Count, Manual 62.0 19.0 - 186.0 K/uL   Dg Ribs Unilateral W/chest Left  Result Date: 07/09/2016 CLINICAL DATA:  Pain.  Fall at home. EXAM: LEFT RIBS AND CHEST - 3+ VIEW COMPARISON:  Chest radiograph 08/18/2008 FINDINGS: Acute minimally displaced fractures posterior lateral left twelfth rib and anterior lateral tenth rib. Nondisplaced  fracture of anterior lateral eleventh rib is likely acute. Remote nondisplaced fractures of the upper anterior lateral ribs are stable from prior chest radiograph. No pneumothorax. Emphysematous change. Ill-defined interstitial opacity at the lung bases suspicious for fibrosis. No large pleural effusion. Heart size is normal. Mild tortuosity of the thoracic aorta. IMPRESSION: 1. Acute fractures of anterior lateral tenth, eleventh, and twelfth ribs, tenth and twelfth rib fractures are minimally displaced. No pneumothorax. 2. Emphysema. Interstitial prominence at the lung bases, left greater than right, may be fibrosis. Electronically Signed   By: Jeb Levering M.D.   On: 07/09/2016 22:41   Ct Head Wo Contrast  Result Date: 07/09/2016 CLINICAL DATA:  Intoxication and fall at home.  Initial encounter. EXAM: CT HEAD WITHOUT CONTRAST CT CERVICAL SPINE WITHOUT CONTRAST TECHNIQUE: Multidetector CT imaging of the head and cervical spine was performed following the standard protocol without intravenous contrast. Multiplanar CT image reconstructions of the cervical spine were also generated. COMPARISON:  07/14/2015 head CT FINDINGS: CT HEAD FINDINGS Brain: No evidence of acute infarction, hemorrhage, hydrocephalus, extra-axial collection or mass lesion/mass  effect. Advanced cortical atrophy with parietal predilection. Vascular: Atherosclerotic calcification. Skull: Negative for fracture or focal lesion. Sinuses/Orbits: Remote blowout fracture of the medial wall right orbit. Other: None CT CERVICAL SPINE FINDINGS Alignment: No traumatic malalignment Skull base and vertebrae: No evidence of acute fracture. Soft tissues and spinal canal: No prevertebral fluid or swelling. No visible canal hematoma. Disc levels: Diffuse endplate irregularity and disc calcification. Upper cervical facet arthropathy with erosive features at C2-3. Upper chest: Fibrotic and emphysematous changes in the apical lungs. IMPRESSION: 1. No evidence  of acute intracranial or cervical spine injury. 2. Advanced cortical atrophy with parietal predilection. 3.  Emphysema. ([id number]) Electronically Signed   By: Monte Fantasia M.D.   On: 07/09/2016 20:57   Ct Cervical Spine Wo Contrast  Result Date: 07/09/2016 CLINICAL DATA:  Intoxication and fall at home.  Initial encounter. EXAM: CT HEAD WITHOUT CONTRAST CT CERVICAL SPINE WITHOUT CONTRAST TECHNIQUE: Multidetector CT imaging of the head and cervical spine was performed following the standard protocol without intravenous contrast. Multiplanar CT image reconstructions of the cervical spine were also generated. COMPARISON:  07/14/2015 head CT FINDINGS: CT HEAD FINDINGS Brain: No evidence of acute infarction, hemorrhage, hydrocephalus, extra-axial collection or mass lesion/mass effect. Advanced cortical atrophy with parietal predilection. Vascular: Atherosclerotic calcification. Skull: Negative for fracture or focal lesion. Sinuses/Orbits: Remote blowout fracture of the medial wall right orbit. Other: None CT CERVICAL SPINE FINDINGS Alignment: No traumatic malalignment Skull base and vertebrae: No evidence of acute fracture. Soft tissues and spinal canal: No prevertebral fluid or swelling. No visible canal hematoma. Disc levels: Diffuse endplate irregularity and disc calcification. Upper cervical facet arthropathy with erosive features at C2-3. Upper chest: Fibrotic and emphysematous changes in the apical lungs. IMPRESSION: 1. No evidence of acute intracranial or cervical spine injury. 2. Advanced cortical atrophy with parietal predilection. 3.  Emphysema. ([id number]) Electronically Signed   By: Monte Fantasia M.D.   On: 07/09/2016 20:57   Assessment/Plan Fall with alcohol intoxication - on CIWA Rib fractures, Left  - ribs 10-12 - incentive spirometry and pulmonary toile - CT chest/abdomen w/ contrast to r/o splenic injury and further evaluate abdominal pain. - check CBC in AM  Chronic anemia -  follow CBC  PMH EtOH related seizure - Dilantin Hyponatremia - urine sodium and osmolality pending Mild leukopenia - suspect secondary to EtOH, HIV antibody pending  DVT Proph: SCD's  Plan: Patient admitted yesterday after a fall and left sided rib fractures, amnestic to event secondary to alcohol intoxication. Responding to questions appropriately today, however was not oriented to place or time this morning. I will order a CT chest/abdomen to further evaluate rib fractures and abdominal pain. Hgb/Hct currently stable, however rib fracture location raises awareness for splenic injury.   Jill Alexanders, [facility] Surgery 07/10/2016, 8:14 AM Pager: [phone] Consults: [phone] Mon-Fri 7:00 am-4:30 pm Sat-Sun 7:00 am-11:30 am

## 2016-07-10 NOTE — Progress Notes (Signed)
MEDICATION RELATED CONSULT NOTE - INITIAL   Pharmacy Consult for Phenytoin Load + Maintenance Indication: seizures, noncompliance  No Known Allergies  Patient Measurements: Height: 5\' 5"  (165.1 cm) Weight: 140 lb 14 oz (63.9 kg) IBW/kg (Calculated) : 61.5  Vital Signs: Temp: 97.9 F (36.6 C) (09/11 0535) Temp Source: Oral (09/11 0535) BP: 128/71 (09/11 0535) Pulse Rate: 72 (09/11 0535) Intake/Output from previous day: 09/10 0701 - 09/11 0700 In: 455 [I.V.:455] Out: 650 [Urine:650] Intake/Output from this shift: No intake/output data recorded.  Labs:  Recent Labs  07/09/16 2003 07/10/16 0454  WBC 3.7* 3.5*  HGB 12.3* 12.8*  HCT 33.6* 34.1*  PLT 208 171  CREATININE 0.63 0.50*  ALBUMIN  --  3.1*  PROT  --  6.3*  AST  --  27  ALT  --  9*  ALKPHOS  --  64  BILITOT  --  0.4   Estimated Creatinine Clearance: 68.3 mL/min (by C-G formula based on SCr of 0.8 mg/dL).   Microbiology: No results found for this or any previous visit (from the past 720 hour(s)).  Medical History: Past Medical History:  Diagnosis Date  . Alcohol abuse   . Seizures ([place])     Medications:  Prescriptions Prior to Admission  Medication Sig Dispense Refill Last Dose  . oxyCODONE-acetaminophen (PERCOCET/ROXICET) 5-325 MG tablet Take 1 tablet by mouth every 4 (four) hours as needed for severe pain. (Patient not taking: Reported on 05/11/2016) 15 tablet 0 Not Taking at Unknown time  . phenytoin (DILANTIN) 200 MG ER capsule Take 200 mg by mouth 3 (three) times daily as needed (seizures).    unknown   Scheduled:  . feeding supplement (ENSURE ENLIVE)  237 mL Oral BID BM  . folic acid  1 mg Oral Daily  . LORazepam  0-4 mg Oral Q6H   Followed by  . [START ON 07/12/2016] LORazepam  0-4 mg Oral Q12H  . multivitamin with minerals  1 tablet Oral Daily  . phenytoin  200 mg Oral TID  . thiamine  100 mg Oral Daily   Or  . thiamine  100 mg Intravenous Daily    Assessment: 75 y.o.malewith  alcoholism and alcohol-related seizures brought to ER after fall at home.  Intoxicated and noted with 3 fractured ribs.  Pharmacy consulted to dose phenytoin, loading with fosphenytoin.  Patient reports taking phenytoin "as needed for seizures" at home.   Today, 07/10/2016:  Phenytoin level undetectable 9/10  LFTs, SCr wnl, albumin moderately low at 3.1  Goal of Therapy:  Phenytoin level 10-20   Plan:   Fosphenytoin 1000 mg IV x 1, given over 30 min (33 mgPE/min)  Phenytoin level tomorrow AM  Resume PO phenytoin tonight; will use PTA dose of 200 mg TID. Since patient taking PRN at home, unclear if this was a valid maintenance dose based on levels (only prior level undetectable in 2016), so will require close monitoring to ensure not an excessive dose.  Reuel Boom, PharmD, BCPS Pager: [phone] 07/10/2016, 2:07 PM

## 2016-07-11 DIAGNOSIS — F10120 Alcohol abuse with intoxication, uncomplicated: Secondary | ICD-10-CM

## 2016-07-11 DIAGNOSIS — E871 Hypo-osmolality and hyponatremia: Secondary | ICD-10-CM

## 2016-07-11 DIAGNOSIS — F1010 Alcohol abuse, uncomplicated: Secondary | ICD-10-CM

## 2016-07-11 DIAGNOSIS — S2232XS Fracture of one rib, left side, sequela: Secondary | ICD-10-CM

## 2016-07-11 DIAGNOSIS — T7589XS Other specified effects of external causes, sequela: Secondary | ICD-10-CM

## 2016-07-11 LAB — BASIC METABOLIC PANEL
Anion gap: 11 (ref 5–15)
BUN: 5 mg/dL — ABNORMAL LOW (ref 6–20)
CALCIUM: 8.6 mg/dL — AB (ref 8.9–10.3)
CO2: 22 mmol/L (ref 22–32)
CREATININE: 0.73 mg/dL (ref 0.61–1.24)
Chloride: 91 mmol/L — ABNORMAL LOW (ref 101–111)
GFR calc non Af Amer: >60 mL/min (ref >60)
GLUCOSE: 122 mg/dL — AB (ref 65–99)
Potassium: 4.5 mmol/L (ref 3.5–5.1)
Sodium: 124 mmol/L — ABNORMAL LOW (ref 135–145)

## 2016-07-11 LAB — CBC
HEMATOCRIT: 35.2 % — AB (ref 39.0–52.0)
Hemoglobin: 13.6 g/dL (ref 13.0–17.0)
MCH: 30.9 pg (ref 26.0–34.0)
MCHC: 38.6 g/dL — AB (ref 30.0–36.0)
MCV: 80 fL (ref 78.0–100.0)
Platelets: 238 10*3/uL (ref 150–400)
RBC: 4.4 MIL/uL (ref 4.22–5.81)
RDW: 14.4 % (ref 11.5–15.5)
WBC: 8.2 10*3/uL (ref 4.0–10.5)

## 2016-07-11 LAB — HIV ANTIBODY (ROUTINE TESTING W REFLEX): HIV SCREEN 4TH GENERATION: NONREACTIVE

## 2016-07-11 LAB — PHENYTOIN LEVEL, TOTAL: Phenytoin Lvl: 24.3 ug/mL — ABNORMAL HIGH (ref 10.0–20.0)

## 2016-07-11 MED ORDER — DEXTROSE 5 % IV SOLN
INTRAVENOUS | Status: DC
  Administered 2016-07-11: 14:00:00 via INTRAVENOUS
  Filled 2016-07-11 (×2): qty 1000

## 2016-07-11 NOTE — Progress Notes (Signed)
Subjective: Voices no complaints. Mostly mumbles.  Minimally cooperative Less agitated today Afebrile.  BP 139/63.  Retrograde rate 18.  SPO2 96% on room air.  CT scan of chest abdomen and pelvis yesterday looks pretty good.  The left rib fractures were again noted.  No pneumothorax.  Tiny pleural effusions.  Underlying pulmonary fibrosis.  No visceral injury in chest or abdomen. Neurology consult and nonspecific EEG noted.  Lab work pending this morning.  Objective: Vital signs in last 24 hours: Temp:  [98 F (36.7 C)-98.4 F (36.9 C)] 98 F (36.7 C) (09/11 2015) Pulse Rate:  [75-89] 89 (09/11 2015) Resp:  [18-20] 18 (09/11 2015) BP: (102-147)/(56-72) 139/63 (09/11 2015) SpO2:  [91 %-97 %] 96 % (09/11 2015) Last BM Date: 07/09/16  Intake/Output from previous day: 09/11 0701 - 09/12 0700 In: 602.1 [I.V.:602.1] Out: 700 [Urine:700] Intake/Output this shift: Total I/O In: -  Out: 700 [Urine:700]   EXAM: General appearance: Arousable.  Verbalizes some.  Not agitated.  Not very cooperative.  Skin warm and dry Head: Normocephalic, without obvious abnormality, atraumatic, No evidence of cranial or facial trauma Neck: no adenopathy, , no JVD, supple, symmetrical, trachea midline, thyroid not enlarged, symmetric, no tenderness/mass/nodules and Neck reveals no swelling.  No apparent pain to passive range of motion Resp: clear to auscultation bilaterally.  Tender left lateral lower chest wall but no instability from rib fractures GI: Abdomen is nontender.  Nondistended.  Bowel sounds present.  Guards voluntarily.  No percussion tenderness.  This is a benign exam. Neurologic: He moves all 4 extremities with equal strength.  No gross motor deficits.  Mostly mumbles.  Occasional intelligible words.  Calm and not agitated.  Lab Results:   Recent Labs  07/09/16 2003 07/10/16 0454  WBC 3.7* 3.5*  HGB 12.3* 12.8*  HCT 33.6* 34.1*  PLT 208 171   BMET  Recent Labs   07/10/16 0454 07/11/16 0521  NA 129* 124*  K 3.8 4.5  CL 99* 91*  CO2 22 22  GLUCOSE 89 122*  BUN <5* 5*  CREATININE 0.50* 0.73  CALCIUM 7.6* 8.6*   PT/INR  Recent Labs  07/09/16 2003  LABPROT 13.3  INR 1.01   ABG No results for input(s): PHART, HCO3 in the last 72 hours.  Invalid input(s): PCO2, PO2  Studies/Results: Dg Ribs Unilateral W/chest Left  Result Date: 07/09/2016 CLINICAL DATA:  Pain.  Fall at home. EXAM: LEFT RIBS AND CHEST - 3+ VIEW COMPARISON:  Chest radiograph 08/18/2008 FINDINGS: Acute minimally displaced fractures posterior lateral left twelfth rib and anterior lateral tenth rib. Nondisplaced fracture of anterior lateral eleventh rib is likely acute. Remote nondisplaced fractures of the upper anterior lateral ribs are stable from prior chest radiograph. No pneumothorax. Emphysematous change. Ill-defined interstitial opacity at the lung bases suspicious for fibrosis. No large pleural effusion. Heart size is normal. Mild tortuosity of the thoracic aorta. IMPRESSION: 1. Acute fractures of anterior lateral tenth, eleventh, and twelfth ribs, tenth and twelfth rib fractures are minimally displaced. No pneumothorax. 2. Emphysema. Interstitial prominence at the lung bases, left greater than right, may be fibrosis. Electronically Signed   By: Jeb Levering M.D.   On: 07/09/2016 22:41   Ct Head Wo Contrast  Result Date: 07/09/2016 CLINICAL DATA:  Intoxication and fall at home.  Initial encounter. EXAM: CT HEAD WITHOUT CONTRAST CT CERVICAL SPINE WITHOUT CONTRAST TECHNIQUE: Multidetector CT imaging of the head and cervical spine was performed following the standard protocol without intravenous contrast. Multiplanar CT image reconstructions  of the cervical spine were also generated. COMPARISON:  07/14/2015 head CT FINDINGS: CT HEAD FINDINGS Brain: No evidence of acute infarction, hemorrhage, hydrocephalus, extra-axial collection or mass lesion/mass effect. Advanced cortical  atrophy with parietal predilection. Vascular: Atherosclerotic calcification. Skull: Negative for fracture or focal lesion. Sinuses/Orbits: Remote blowout fracture of the medial wall right orbit. Other: None CT CERVICAL SPINE FINDINGS Alignment: No traumatic malalignment Skull base and vertebrae: No evidence of acute fracture. Soft tissues and spinal canal: No prevertebral fluid or swelling. No visible canal hematoma. Disc levels: Diffuse endplate irregularity and disc calcification. Upper cervical facet arthropathy with erosive features at C2-3. Upper chest: Fibrotic and emphysematous changes in the apical lungs. IMPRESSION: 1. No evidence of acute intracranial or cervical spine injury. 2. Advanced cortical atrophy with parietal predilection. 3.  Emphysema. [id number]) Electronically Signed   By: Monte Fantasia M.D.   On: 07/09/2016 20:57   Ct Chest W Contrast  Result Date: 07/10/2016 CLINICAL DATA:  Fall yesterday with shortness of breath. Left-sided rib fractures on radiographs. EXAM: CT CHEST, ABDOMEN, AND PELVIS WITH CONTRAST TECHNIQUE: Multidetector CT imaging of the chest, abdomen and pelvis was performed following the standard protocol during bolus administration of intravenous contrast. CONTRAST:  124mL ISOVUE-300 IOPAMIDOL (ISOVUE-300) INJECTION 61% COMPARISON:  Chest and pelvic radiographs same date. FINDINGS: CT CHEST FINDINGS Cardiovascular: No evidence of mediastinal hematoma or acute vascular injury. The pulmonary arteries appear unremarkable. Mild atherosclerosis of the aorta, great vessels and coronary arteries. The heart size is normal. There is no pericardial effusion. Mediastinum/Nodes: There are no enlarged mediastinal, hilar or axillary lymph nodes. The thyroid gland, trachea and esophagus demonstrate no significant findings. Lungs/Pleura: Small dependent pleural effusions bilaterally measure water density. There is diffuse chronic lung disease with subpleural reticulation, mild  bronchiectasis and scattered honeycomb formation. No confluent airspace opacity, suspicious pulmonary nodule or pneumothorax. Musculoskeletal/Chest wall: Again demonstrated are acute fractures of the left tenth, eleventh and twelfth ribs. Old left-sided rib fractures are also noted. There is ossification around the coracoclavicular ligament on the right, likely related to remote injury. CT ABDOMEN AND PELVIS FINDINGS Hepatobiliary: No evidence of acute hepatic injury or focal lesion. No evidence of gallstones, gallbladder wall thickening or biliary dilatation. Pancreas: Unremarkable. No pancreatic ductal dilatation or surrounding inflammatory changes. Spleen: Normal in size without focal abnormality. No evidence of splenic injury or surrounding hematoma. Adrenals/Urinary Tract: Both adrenal glands appear normal. There is a right renal parapelvic cysts. The kidneys otherwise appear normal. No evidence of urinary tract calculus or hydronephrosis. No evidence of bladder injury or focal bladder lesion. Stomach/Bowel: No evidence of bowel wall thickening, distention or surrounding inflammatory change. Vascular/Lymphatic: There are no enlarged abdominal or pelvic lymph nodes. No evidence of retroperitoneal hematoma. There is atherosclerosis of the aorta and its branch vessels. No evidence of large vessel occlusion. Reproductive: The prostate gland and seminal vesicles appear unremarkable. Other: No evidence of hemoperitoneum or pneumoperitoneum. Musculoskeletal: No acute osseous findings are seen. There is lower lumbar spondylosis. There is asymmetric sclerosis of the left femoral head which may indicate avascular necrosis. IMPRESSION: 1. Acute lower left rib fractures as demonstrated radiographically. No evidence of pneumothorax. 2. Small bilateral pleural effusions. 3. 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 4. No acute findings demonstrated within the abdomen or pelvis. 5.  Aortic  Atherosclerosis (ICD10-170.0) Electronically Signed   By: Richardean Sale M.D.   On: 07/10/2016 13:18   Ct Cervical Spine Wo Contrast  Result Date: 07/09/2016 CLINICAL DATA:  Intoxication and fall  at home.  Initial encounter. EXAM: CT HEAD WITHOUT CONTRAST CT CERVICAL SPINE WITHOUT CONTRAST TECHNIQUE: Multidetector CT imaging of the head and cervical spine was performed following the standard protocol without intravenous contrast. Multiplanar CT image reconstructions of the cervical spine were also generated. COMPARISON:  07/14/2015 head CT FINDINGS: CT HEAD FINDINGS Brain: No evidence of acute infarction, hemorrhage, hydrocephalus, extra-axial collection or mass lesion/mass effect. Advanced cortical atrophy with parietal predilection. Vascular: Atherosclerotic calcification. Skull: Negative for fracture or focal lesion. Sinuses/Orbits: Remote blowout fracture of the medial wall right orbit. Other: None CT CERVICAL SPINE FINDINGS Alignment: No traumatic malalignment Skull base and vertebrae: No evidence of acute fracture. Soft tissues and spinal canal: No prevertebral fluid or swelling. No visible canal hematoma. Disc levels: Diffuse endplate irregularity and disc calcification. Upper cervical facet arthropathy with erosive features at C2-3. Upper chest: Fibrotic and emphysematous changes in the apical lungs. IMPRESSION: 1. No evidence of acute intracranial or cervical spine injury. 2. Advanced cortical atrophy with parietal predilection. 3.  Emphysema. [id number]) Electronically Signed   By: Monte Fantasia M.D.   On: 07/09/2016 20:57   Ct Abdomen Pelvis W Contrast  Result Date: 07/10/2016 CLINICAL DATA:  Fall yesterday with shortness of breath. Left-sided rib fractures on radiographs. EXAM: CT CHEST, ABDOMEN, AND PELVIS WITH CONTRAST TECHNIQUE: Multidetector CT imaging of the chest, abdomen and pelvis was performed following the standard protocol during bolus administration of intravenous contrast.  CONTRAST:  178mL ISOVUE-300 IOPAMIDOL (ISOVUE-300) INJECTION 61% COMPARISON:  Chest and pelvic radiographs same date. FINDINGS: CT CHEST FINDINGS Cardiovascular: No evidence of mediastinal hematoma or acute vascular injury. The pulmonary arteries appear unremarkable. Mild atherosclerosis of the aorta, great vessels and coronary arteries. The heart size is normal. There is no pericardial effusion. Mediastinum/Nodes: There are no enlarged mediastinal, hilar or axillary lymph nodes. The thyroid gland, trachea and esophagus demonstrate no significant findings. Lungs/Pleura: Small dependent pleural effusions bilaterally measure water density. There is diffuse chronic lung disease with subpleural reticulation, mild bronchiectasis and scattered honeycomb formation. No confluent airspace opacity, suspicious pulmonary nodule or pneumothorax. Musculoskeletal/Chest wall: Again demonstrated are acute fractures of the left tenth, eleventh and twelfth ribs. Old left-sided rib fractures are also noted. There is ossification around the coracoclavicular ligament on the right, likely related to remote injury. CT ABDOMEN AND PELVIS FINDINGS Hepatobiliary: No evidence of acute hepatic injury or focal lesion. No evidence of gallstones, gallbladder wall thickening or biliary dilatation. Pancreas: Unremarkable. No pancreatic ductal dilatation or surrounding inflammatory changes. Spleen: Normal in size without focal abnormality. No evidence of splenic injury or surrounding hematoma. Adrenals/Urinary Tract: Both adrenal glands appear normal. There is a right renal parapelvic cysts. The kidneys otherwise appear normal. No evidence of urinary tract calculus or hydronephrosis. No evidence of bladder injury or focal bladder lesion. Stomach/Bowel: No evidence of bowel wall thickening, distention or surrounding inflammatory change. Vascular/Lymphatic: There are no enlarged abdominal or pelvic lymph nodes. No evidence of retroperitoneal hematoma.  There is atherosclerosis of the aorta and its branch vessels. No evidence of large vessel occlusion. Reproductive: The prostate gland and seminal vesicles appear unremarkable. Other: No evidence of hemoperitoneum or pneumoperitoneum. Musculoskeletal: No acute osseous findings are seen. There is lower lumbar spondylosis. There is asymmetric sclerosis of the left femoral head which may indicate avascular necrosis. IMPRESSION: 1. Acute lower left rib fractures as demonstrated radiographically. No evidence of pneumothorax. 2. Small bilateral pleural effusions. 3. 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 4. No acute findings demonstrated within the abdomen  or pelvis. 5.  Aortic Atherosclerosis (ICD10-170.0) Electronically Signed   By: Richardean Sale M.D.   On: 07/10/2016 13:18   Dg Pelvis Portable  Result Date: 07/10/2016 CLINICAL DATA:  Bilateral hip pain, left greater than right. No recent injury. EXAM: PORTABLE PELVIS 1-2 VIEWS COMPARISON:  None. FINDINGS: 0850 hours. The bones appear mildly demineralized. There is no evidence of acute fracture, dislocation or femoral head avascular necrosis. Mild degenerative changes are present at the hip and sacroiliac joints bilaterally. There is lower lumbar spondylosis with paraspinal osteophytes asymmetric to the right. Iliofemoral atherosclerosis noted. IMPRESSION: No acute osseous findings identified. Degenerative changes as described. Electronically Signed   By: Richardean Sale M.D.   On: 07/10/2016 09:09   Dg Chest Port 1 View  Result Date: 07/10/2016 CLINICAL DATA:  75 year old male with a history of shortness of breath EXAM: PORTABLE CHEST 1 VIEW COMPARISON:  07/09/2016, 08/18/2008 FINDINGS: Cardiomediastinal silhouette unchanged in size and contour. In the last 24 hours there has been increased interlobular septal thickening and interstitial prominence with thickening of the minor fissure. No large pleural effusion. No pneumothorax.  Mild airspace disease in the lower lobes. No pneumothorax. Left-sided rib fractures better characterized on prior plain film. Nondisplaced rib fractures of the lower right ribs. IMPRESSION: Developing pulmonary edema. Left-sided rib fractures better characterized on prior plain film of the chest. There are additional nondisplaced fractures of lower right-sided ribs, which appear to be subacute with healing/ callus formation. Signed, Dulcy Fanny. Earleen Newport, DO [facility] Electronically Signed   By: Corrie Mckusick D.O.   On: 07/10/2016 09:14    Anti-infectives: Anti-infectives    None      Assessment/Plan:    Fall with alcohol intoxication, possible seizure.  On CIWA protocol    Altered mental status -multifactorial including a call intoxication, probable alcohol withdrawal, and cerebral atrophy        EEG without seizure activity.Mild slowing.    Rib fracture, left, ribs 10 through 12, minimally displaced, without pneumothorax or hemothorax.         At risk for pneumonia.  Encourage incentive spirometry and ambulation as much as his mental status will allow         check CBC today.  Doubt active hemorrhage.        CT scans negative for intrathoracic or intra-abdominal visceral injury        Begin clear liquid diet.  Advance as tolerated        PT, OT, and social services and case management already involved appropriately.    Tobacco abuse  Not much else to add from trauma surgery standpoint. We will be available if you need [place].   LOS: 1 day    Adin Hector 07/11/2016

## 2016-07-11 NOTE — Progress Notes (Signed)
MEDICATION RELATED CONSULT NOTE  Pharmacy Consult for Phenytoin Load + Maintenance Indication: seizures, noncompliance  No Known Allergies  Patient Measurements: Height: 5\' 5"  (165.1 cm) Weight: 140 lb 14 oz (63.9 kg) IBW/kg (Calculated) : 61.5  Vital Signs: Temp: 97.7 F (36.5 C) (09/12 [id number]) Temp Source: Axillary (09/12 [id number]) BP: 132/80 (09/12 [id number]) Intake/Output from previous day: 09/11 0701 - 09/12 0700 In: 602.1 [I.V.:602.1] Out: 700 [Urine:700] Intake/Output from this shift: No intake/output data recorded.  Labs:  Recent Labs  07/09/16 2003 07/10/16 0454 07/11/16 0521  WBC 3.7* 3.5* 8.2  HGB 12.3* 12.8* 13.6  HCT 33.6* 34.1* 35.2*  PLT 208 171 238  CREATININE 0.63 0.50* 0.73  ALBUMIN  --  3.1*  --   PROT  --  6.3*  --   AST  --  27  --   ALT  --  9*  --   ALKPHOS  --  64  --   BILITOT  --  0.4  --    Estimated Creatinine Clearance: 68.3 mL/min (by C-G formula based on SCr of 0.8 mg/dL).   Microbiology: No results found for this or any previous visit (from the past 720 hour(s)).  Medications:  Prescriptions Prior to Admission  Medication Sig Dispense Refill Last Dose  . oxyCODONE-acetaminophen (PERCOCET/ROXICET) 5-325 MG tablet Take 1 tablet by mouth every 4 (four) hours as needed for severe pain. (Patient not taking: Reported on 05/11/2016) 15 tablet 0 Not Taking at Unknown time  . phenytoin (DILANTIN) 200 MG ER capsule Take 200 mg by mouth 3 (three) times daily as needed (seizures).    unknown   Scheduled:  . feeding supplement (ENSURE ENLIVE)  237 mL Oral BID BM  . folic acid  1 mg Oral Daily  . LORazepam  0-4 mg Oral Q6H   Followed by  . [START ON 07/12/2016] LORazepam  0-4 mg Oral Q12H  . multivitamin with minerals  1 tablet Oral Daily  . thiamine  100 mg Oral Daily   Or  . thiamine  100 mg Intravenous Daily    Assessment: Terry Cartermalewith alcoholism and alcohol-related seizures brought to ER after fall at home.  Intoxicated and noted with  3 fractured ribs. Pharmacy consulted to dose phenytoin, loading with fosphenytoin. Patient reports taking phenytoin "as needed for seizures" at home. LFTs, SCr wnl, albumin moderately low at 3.1  Significant Events  9/10: Phenytoin level undetectable (< 2.5 mcg/mL)  Today, 07/11/2016:  AM phenytoin level 24.3 (corrects to 33.8 based on albumin 3.1)  Goal of Therapy:  Phenytoin level 10-20 mcg/mL  Plan:   Hold PO phenytoin today  Recheck phenytoin level and albumin tomorrow AM  Will resume phenytoin at the lower weight-based dose of 100 mg TID once levels are therapeutic.  Reuel Boom, PharmD, BCPS Pager: [phone] 07/11/2016, 8:36 AM

## 2016-07-11 NOTE — Evaluation (Signed)
SLP Cancellation Note  Patient Details Name: Stanton Lue MRN: [id number] DOB: 01/04/1941   Cancelled treatment:       Reason Eval/Treat Not Completed: Fatigue/lethargy limiting ability to participate  Luanna Salk, [facility] SLP [phone]

## 2016-07-11 NOTE — Progress Notes (Addendum)
Patient ID: [name], male   DOB: Oct 25, 1941, 75 y.o.   MRN: [id number]  PROGRESS NOTE    [name]  [id number] DOB: August 27, 1941 DOA: 07/09/2016  PCP: Odis Luster, MD   Brief Narrative:  75 y.o.malewith past medical history of alcoholism and alcohol-related seizures who presented to [facility] intoxicated. He apparently fell which brought him to ED. CT head and neck done on admission was unremarkable. Chest x-ray showed fractures of the 10th, 11th and 12th ribs. His blood work was notable for hyponatremia. Alcohol level was 292.   Assessment & Plan:  Fall / Rib fractures - Appreciate PT evaluation: Recommendation for skilled nursing facility placement - Seen by trauma surgery; continue supportive care per their recommendation - Continue pain management efforts  Acute alcohol intoxication / Acute metabolic encephalopathy  - Altered mental status likely secondary to acute alcohol intoxication - Continue CIWA protocol - Continue thiamine, folic acid and multivitamin - No reports of withdrawal so far  Hyponatremia - Likely related to history of alcohol abuse - Sodium level 124 - Will change IV fluids to D5 free water  Leukopenia - Likely bone marrow suppression from alcohol abuse - White blood cell count subsequently normalized  Severe protein calorie malnutrition - Continue nutritional supplementation - SLP evaluation pending  DVT prophylaxis: SCD's bilaterally  Code Status: full code  Family Communication: No family at the bedside Disposition Plan: To skilled nursing facility possibly by 07/12/2016 or 07/13/2016   Consultants:   Surgery  SLP  PT  Procedures:   None  Antimicrobials:   None    Subjective: No overnight events.   Objective: Vitals:   07/10/16 1511 07/10/16 1846 07/10/16 2015 07/11/16 0632  BP: (!) 147/67 127/72 139/63 132/80  Pulse: 84 79 89   Resp: 20 20 18 18   Temp:  98.4 F (36.9 C) 98 F (36.7 C)  97.7 F (36.5 C)  TempSrc:  Oral  Axillary  SpO2: 96% 97% 96% 97%  Weight:      Height:        Intake/Output Summary (Last 24 hours) at 07/11/16 1216 Last data filed at 07/11/16 0259  Gross per 24 hour  Intake           602.08 ml  Output              700 ml  Net           -97.92 ml   Filed Weights   07/09/16 1925 07/10/16 0015  Weight: 64 kg (141 lb) 63.9 kg (140 lb 14 oz)    Examination:  General exam: Appears calm and comfortable  Respiratory system: Clear to auscultation. Respiratory effort normal. Cardiovascular system: S1 & S2 heard, Rate controlled  Gastrointestinal system: Abdomen is nondistended, soft and nontender. No organomegaly or masses felt. Normal bowel sounds heard. Central nervous system: No focal neurological deficits. Extremities: Symmetric 5 x 5 power. Skin: No rashes, lesions or ulcers Psychiatry: Has just received ativan so drowsy at this time, answers briefly but falls back asleep   Data Reviewed: I have personally reviewed following labs and imaging studies  CBC:  Recent Labs Lab 07/09/16 2003 07/10/16 0454 07/11/16 0521  WBC 3.7* 3.5* 8.2  NEUTROABS 2.0 2.0  --   HGB 12.3* 12.8* 13.6  HCT 33.6* 34.1* 35.2*  MCV 83.2 82.4 80.0  PLT 208 171 [id number]   Basic Metabolic Panel:  Recent Labs Lab 07/09/16 2003 07/10/16 0454 07/11/16 0521  NA 125* 129* 124*  K 3.5 3.8  4.5  CL 92* 99* 91*  CO2 25 22 22   GLUCOSE 115* 89 122*  BUN 7 <5* 5*  CREATININE 0.63 0.50* 0.73  CALCIUM 8.1* 7.6* 8.6*   GFR: Estimated Creatinine Clearance: 68.3 mL/min (by C-G formula based on SCr of 0.8 mg/dL). Liver Function Tests:  Recent Labs Lab 07/10/16 0454  AST 27  ALT 9*  ALKPHOS 64  BILITOT 0.4  PROT 6.3*  ALBUMIN 3.1*   No results for input(s): LIPASE, AMYLASE in the last 168 hours.  Recent Labs Lab 07/10/16 1025  AMMONIA 14   Coagulation Profile:  Recent Labs Lab 07/09/16 2003  INR 1.01   Cardiac Enzymes:  Recent Labs Lab  07/10/16 0454 07/10/16 1025 07/10/16 1701  CKTOTAL 271  --   --   TROPONINI <0.03 <0.03 <0.03   BNP (last 3 results) No results for input(s): PROBNP in the last 8760 hours. HbA1C: No results for input(s): HGBA1C in the last 72 hours. CBG: No results for input(s): GLUCAP in the last 168 hours. Lipid Profile: No results for input(s): CHOL, HDL, LDLCALC, TRIG, CHOLHDL, LDLDIRECT in the last 72 hours. Thyroid Function Tests:  Recent Labs  07/10/16 0454  TSH 2.799   Anemia Panel:  Recent Labs  07/10/16 0454  VITAMINB12 510  FOLATE 11.1  FERRITIN 120  TIBC 224*  IRON 73  RETICCTPCT 1.5   Urine analysis:    Component Value Date/Time   COLORURINE YELLOW 07/14/2015 1352   APPEARANCEUR CLEAR 07/14/2015 1352   LABSPEC 1.015 07/14/2015 1352   PHURINE 7.5 07/14/2015 1352   GLUCOSEU NEGATIVE 07/14/2015 1352   HGBUR NEGATIVE 07/14/2015 1352   BILIRUBINUR NEGATIVE 07/14/2015 1352   KETONESUR NEGATIVE 07/14/2015 1352   PROTEINUR NEGATIVE 07/14/2015 1352   UROBILINOGEN 2.0 (H) 07/14/2015 1352   NITRITE NEGATIVE 07/14/2015 1352   LEUKOCYTESUR NEGATIVE 07/14/2015 1352   Sepsis Labs: @LABRCNTIP (procalcitonin:4,lacticidven:4)   )No results found for this or any previous visit (from the past 240 hour(s)).    Radiology Studies: Dg Ribs Unilateral W/chest Left Result Date: 07/09/2016 1. Acute fractures of anterior lateral tenth, eleventh, and twelfth ribs, tenth and twelfth rib fractures are minimally displaced. No pneumothorax. 2. Emphysema. Interstitial prominence at the lung bases, left greater than right, may be fibrosis.   Ct Head Wo Contrast Result Date: 07/09/2016 1. No evidence of acute intracranial or cervical spine injury. 2. Advanced cortical atrophy with parietal predilection. 3.  Emphysema.   Ct Chest W Contrast Result Date: 07/10/2016  1. Acute lower left rib fractures as demonstrated radiographically. No evidence of pneumothorax. 2. Small bilateral pleural  effusions. 3. 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 4. No acute findings demonstrated within the abdomen or pelvis. 5.  Aortic Atherosclerosis   Ct Cervical Spine Wo Contrast Result Date: 07/09/2016 1. No evidence of acute intracranial or cervical spine injury. 2. Advanced cortical atrophy with parietal predilection. 3.  Emphysema. (  Ct Abdomen Pelvis W Contrast Result Date: 07/10/2016 1. Acute lower left rib fractures as demonstrated radiographically. No evidence of pneumothorax. 2. Small bilateral pleural effusions. 3. 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 4. No acute findings demonstrated within the abdomen or pelvis. 5.  Aortic Atherosclerosis   Dg Pelvis Portable Result Date: 07/10/2016 No acute osseous findings identified. Degenerative changes as described.   Dg Chest Port 1 View Result Date: 07/10/2016  Developing pulmonary edema. Left-sided rib fractures better characterized on prior plain film of the chest. There are additional nondisplaced fractures of lower right-sided  ribs, which appear to be subacute with healing/ callus formation.     Scheduled Meds: . feeding supplement (ENSURE ENLIVE)  237 mL Oral BID BM  . folic acid  1 mg Oral Daily  . LORazepam  0-4 mg Oral Q6H   Followed by  . [START ON 07/12/2016] LORazepam  0-4 mg Oral Q12H  . multivitamin with minerals  1 tablet Oral Daily  . thiamine  100 mg Oral Daily   Or  . thiamine  100 mg Intravenous Daily   Continuous Infusions:    LOS: 1 day    Time spent: 25 minutes  Greater than 50% of the time spent on counseling and coordinating the care.   Leisa Lenz, MD [facility] Pager [phone]  If 7PM-7AM, please contact night-coverage [email or web address] Password [facility] 07/11/2016, 12:16 PM

## 2016-07-12 LAB — COMPREHENSIVE METABOLIC PANEL
ALK PHOS: 76 U/L (ref 38–126)
ALT: 12 U/L — ABNORMAL LOW (ref 17–63)
ANION GAP: 9 (ref 5–15)
AST: 37 U/L (ref 15–41)
Albumin: 3.4 g/dL — ABNORMAL LOW (ref 3.5–5.0)
BILIRUBIN TOTAL: 1.1 mg/dL (ref 0.3–1.2)
BUN: 7 mg/dL (ref 6–20)
CALCIUM: 8.7 mg/dL — AB (ref 8.9–10.3)
CO2: 24 mmol/L (ref 22–32)
Chloride: 90 mmol/L — ABNORMAL LOW (ref 101–111)
Creatinine, Ser: 0.58 mg/dL — ABNORMAL LOW (ref 0.61–1.24)
GFR calc non Af Amer: >60 mL/min (ref >60)
Glucose, Bld: 136 mg/dL — ABNORMAL HIGH (ref 65–99)
Potassium: 4.2 mmol/L (ref 3.5–5.1)
Sodium: 123 mmol/L — ABNORMAL LOW (ref 135–145)
TOTAL PROTEIN: 7.4 g/dL (ref 6.5–8.1)

## 2016-07-12 LAB — PHENYTOIN LEVEL, TOTAL: Phenytoin Lvl: 19.5 ug/mL (ref 10.0–20.0)

## 2016-07-12 MED ORDER — DEXTROSE 5 % IV SOLN
INTRAVENOUS | Status: DC
  Administered 2016-07-12 – 2016-07-13 (×2): via INTRAVENOUS
  Filled 2016-07-12 (×3): qty 1000

## 2016-07-12 MED ORDER — RESOURCE THICKENUP CLEAR PO POWD
ORAL | Status: DC | PRN
  Filled 2016-07-12: qty 125

## 2016-07-12 MED ORDER — SODIUM CHLORIDE 0.9 % IV SOLN
INTRAVENOUS | Status: DC
  Administered 2016-07-12: 12:00:00 via INTRAVENOUS

## 2016-07-12 MED ORDER — LORAZEPAM 2 MG/ML IJ SOLN
2.0000 mg | INTRAMUSCULAR | Status: DC | PRN
  Administered 2016-07-12 – 2016-07-13 (×2): 2 mg via INTRAVENOUS
  Filled 2016-07-12 (×2): qty 1

## 2016-07-12 MED ORDER — PHENYTOIN SODIUM EXTENDED 100 MG PO CAPS
100.0000 mg | ORAL_CAPSULE | Freq: Three times a day (TID) | ORAL | Status: DC
  Filled 2016-07-12 (×2): qty 1

## 2016-07-12 NOTE — Clinical Social Work Note (Signed)
Patient's sister, Estill Batten confirmed that she is agreeable to possible SNF placement outside of [facility] due to barriers of insurance and alcohol abuse.   Pt's sister reported if patient cannot be placed in [facility], she would prefer [place] because she goes to church in that area.   MSW re-faxed referral to surrounding counties. [place] will not admit patient due to alcohol abuse.   [facility] has reviewed referral and extended bed offer. Sister, [name] agreeable to disposition. Facility is contracted with insurance and has initiated authorization.   MSW remains available as needed.   Glendon Axe, MSW [phone] 07/13/2016 10:49 AM

## 2016-07-12 NOTE — NC FL2 (Signed)
[place] LEVEL OF CARE SCREENING TOOL     IDENTIFICATION  Patient Name: [name] Birthdate: 12/07/1940 Sex: male Admission Date (Current Location): 07/09/2016  [facility] and [place] Number:  Herbalist and Address:  [facility],  [place] [address] [place]      Provider Number: [id number]  Attending Physician Name and Address:  Robbie Lis, MD  Relative Name and Phone Number:       Current Level of Care: Hospital Recommended Level of Care: [place] Prior Approval Number:    Date Approved/Denied:   PASRR Number:   [id number]  Discharge Plan: SNF    Current Diagnoses: Patient Active Problem List   Diagnosis Date Noted  . Rib fracture 07/10/2016  . Alcohol intoxication ([place]) 07/10/2016  . Fall 07/10/2016  . Hyponatremia 07/09/2016    Orientation RESPIRATION BLADDER Height & Weight     Self  Normal Incontinent, External catheter Weight: 140 lb 14 oz (63.9 kg) Height:  5\' 5"  (165.1 cm)  BEHAVIORAL SYMPTOMS/MOOD NEUROLOGICAL BOWEL NUTRITION STATUS   (none ) Convulsions/Seizures Continent Diet (Clear Liquid )  AMBULATORY STATUS COMMUNICATION OF NEEDS Skin   Extensive Assist Verbally Normal                       Personal Care Assistance Level of Assistance  Bathing, Feeding, Dressing Bathing Assistance: Maximum assistance Feeding assistance: Maximum assistance Dressing Assistance: Maximum assistance     Functional Limitations Info  Speech, Hearing, Sight Sight Info: Adequate Hearing Info: Adequate Speech Info: Adequate    SPECIAL CARE FACTORS FREQUENCY  PT (By licensed PT)     PT Frequency: 3              Contractures      Additional Factors Info  Code Status, Allergies Code Status Info: FULL CODE  Allergies Info: N/A            Current Medications (07/12/2016):  This is the current hospital active medication list Current Facility-Administered Medications  Medication  Dose Route Frequency Provider Last Rate Last Dose  . dextrose 5 % 1,000 mL infusion   Intravenous Continuous Robbie Lis, MD 50 mL/hr at 07/11/16 1330    . feeding supplement (ENSURE ENLIVE) (ENSURE ENLIVE) liquid 237 mL  237 mL Oral BID BM Rise Patience, MD      . folic acid (FOLVITE) tablet 1 mg  1 mg Oral Daily Rise Patience, MD   1 mg at 07/10/16 0921  . LORazepam (ATIVAN) tablet 1 mg  1 mg Oral Q6H PRN Rise Patience, MD       Or  . LORazepam (ATIVAN) injection 1 mg  1 mg Intravenous Q6H PRN Rise Patience, MD   2 mg at 07/10/16 1532  . LORazepam (ATIVAN) tablet 0-4 mg  0-4 mg Oral Q12H Rise Patience, MD      . multivitamin with minerals tablet 1 tablet  1 tablet Oral Daily Rise Patience, MD   1 tablet at 07/10/16 0921  . ondansetron (ZOFRAN) tablet 4 mg  4 mg Oral Q6H PRN Rise Patience, MD       Or  . ondansetron [facility]) injection 4 mg  4 mg Intravenous Q6H PRN Rise Patience, MD      . Derrill Memo ON 07/13/2016] phenytoin (DILANTIN) ER capsule 100 mg  100 mg Oral TID Polly Cobia, RPH      . thiamine (VITAMIN  B-1) tablet 100 mg  100 mg Oral Daily Rise Patience, MD   100 mg at 07/10/16 [id number]   Or  . thiamine (B-1) injection 100 mg  100 mg Intravenous Daily Rise Patience, MD         Discharge Medications: Please see discharge summary for a list of discharge medications.  Relevant Imaging Results:  Relevant Lab Results:   Additional Information SSN [id number]  Glendon Axe A

## 2016-07-12 NOTE — Progress Notes (Signed)
Physical Therapy Treatment Patient Details Name: [name] MRN: [id number] DOB: 1941/09/09 Today's Date: 2016-07-29    History of Present Illness 75 y.o. male with alcoholism and alcohol-related seizures and admitted with fall with alcohol intoxication with rib fractures on the left side. Chest x-ray shows fractures of the 10th of 11th and 12th ribs.    PT Comments    Pt requiring increased assist for mobility at this time.  Continue to recommend SNF upon d/c.  Follow Up Recommendations  SNF;Supervision/Assistance - 24 hour     Equipment Recommendations  Rolling walker with 5" wheels    Recommendations for Other Services       Precautions / Restrictions Precautions Precautions: Fall Precaution Comments: seizure    Mobility  Bed Mobility Overal bed mobility: Needs Assistance;+2 for physical assistance Bed Mobility: Supine to Sit     Supine to sit: +2 for physical assistance;Max assist;HOB elevated Sit to supine: +2 for physical assistance;Max assist   General bed mobility comments: multimodal cues for bed mobility and pt requiring increased assist  Transfers Overall transfer level: Needs assistance Equipment used: Rolling walker (2 wheeled) Transfers: Sit to/from Stand Sit to Stand: Mod assist;+2 physical assistance         General transfer comment: multimodal cues for safe technique, assist to rise, steady and control descent, pt with forward trunk flexion and difficulty correcting, very stiff posture, did not ambulate for safety  Ambulation/Gait                 Stairs            Wheelchair Mobility    Modified Rankin (Stroke Patients Only)       Balance                                    Cognition Arousal/Alertness: Awake/alert (awake for task, quickly return to sleep) Behavior During Therapy: Flat affect Overall Cognitive Status: Difficult to assess                      Exercises      General Comments         Pertinent Vitals/Pain Pain Assessment: Faces Faces Pain Scale: Hurts even more Pain Location: ribs with transitional movements Pain Descriptors / Indicators: Grimacing;Guarding Pain Intervention(s): Limited activity within patient's tolerance;Monitored during session;Repositioned    Home Living                      Prior Function            PT Goals (current goals can now be found in the care plan section) Progress towards PT goals: Progressing toward goals    Frequency  Min 3X/week    PT Plan Current plan remains appropriate    Co-evaluation             End of Session Equipment Utilized During Treatment: Gait belt Activity Tolerance: Patient limited by fatigue Patient left: in bed;with call bell/phone within reach;with bed alarm set     Time: [id number] PT Time Calculation (min) (ACUTE ONLY): 12 min  Charges:  $Therapeutic Activity: 8-22 mins                    G Codes:      Emogene Muratalla,KATHrine E Jul 29, 2016, 3:59 PM Carmelia Bake, PT, DPT July 29, 2016 Pager: [phone]

## 2016-07-12 NOTE — Evaluation (Signed)
Clinical/Bedside Swallow Evaluation Patient Details  Name: [name] MRN: [id number] Date of Birth: 1941/05/02  Today's Date: 07/12/2016 Time: SLP Start Time (ACUTE ONLY): 1427 SLP Stop Time (ACUTE ONLY): 1504 SLP Time Calculation (min) (ACUTE ONLY): 37 min  Past Medical History:  Past Medical History:  Diagnosis Date  . Alcohol abuse   . Seizures ([place])    Past Surgical History: History reviewed. No pertinent surgical history. HPI:  75 yo male with past medical history of alcoholism and alcohol-related seizures who presented to [facility] intoxicated. He apparently fell which brought him to ED. CT head and neck done on admission was unremarkable. Chest x-ray showed fractures of the 10th, 11th and 12th ribs. His blood work was notable for hyponatremia per Md notes.  Pt CT chest showed bilateral pleural effusions.  Swallow evaluation ordered as pt has been lethargic and having difficulty swallowing.  Pt admits to some dysphagia prior to admission resulting in some coughing - more with liquids than food per pt.        Assessment / Plan / Recommendation Clinical Impression  Pt presents with symptoms of mild dysphagia suspected due to mental status/current medical condition.  Oral holding and delayed oral transiting noted across consistencies with delayed swallow response likely due to weakness/AMS.  Pt benefited from dry spoon pressure to lingua to aid motor planning with dry swallows at times.  Intermittent wet vocal quality noted - mosty with initiating po intake- likely due to secretion retention in pharynx/larynx.  Vocal quality cleared well with cued throat clearing/cough and as intake progressed.    Suspect some baseline deficits as pt does report coughing some with liquids prior to admission.  Although pt did not overtly cough with thin liquids, his mental status and report of baseline deficits will increase asp risk.  To mitigate risk, recommend modify diet to nectar liquids  with use of compensation strategies.    SLP to follow to assure tolerance, assess for indication for instrumental evaluation.  Skilled intervention included determining effective compensation strategies and reinforcing strategies with pt.  Anticipate pt's swallow ability to continue to improve with medical recovery.    Thanks for this consult.      Aspiration Risk  Moderate aspiration risk    Diet Recommendation Nectar-thick liquid;Other (Comment) (clears)   Liquid Administration via: Cup;No straw Medication Administration: Crushed with puree Supervision: Full supervision/cueing for compensatory strategies Compensations: Slow rate;Small sips/bites;Clear throat intermittently (intermittent dry swallow, cued cough/throat clear if voice wet) Postural Changes: Seated upright at 90 degrees;Remain upright for at least 30 minutes after po intake    Other  Recommendations Oral Care Recommendations: Oral care BID Other Recommendations: Order thickener from pharmacy   Follow up Recommendations       Frequency and Duration min 2x/week  1 week       Prognosis Prognosis for Safe Diet Advancement: Fair Barriers to Reach Goals: Cognitive deficits      Swallow Study   General Date of Onset: 07/12/16 HPI: 75 yo male with past medical history of alcoholism and alcohol-related seizures who presented to [facility] intoxicated. He apparently fell which brought him to ED. CT head and neck done on admission was unremarkable. Chest x-ray showed fractures of the 10th, 11th and 12th ribs. His blood work was notable for hyponatremia per Md notes.  Pt CT chest showed bilateral pleural effusions.  Swallow evaluation ordered as pt has been lethargic and having difficulty swallowing.  Pt admits to some dysphagia prior to admission  resulting in some coughing - more with liquids than food per pt.      Type of Study: Bedside Swallow Evaluation Diet Prior to this Study: Thin liquids;Other (Comment) (clear  liquids) Temperature Spikes Noted: No Respiratory Status: Room air History of Recent Intubation: No Behavior/Cognition: Cooperative;Other (Comment);Requires cueing (sleepy but participative) Oral Cavity Assessment: Within Functional Limits Oral Care Completed by SLP: No Oral Cavity - Dentition: Dentures, top;Edentulous Self-Feeding Abilities: Total assist (pt had mitts in place for safety) Patient Positioning: Upright in bed Baseline Vocal Quality: Low vocal intensity Volitional Cough: Weak Volitional Swallow: Able to elicit (inconsistently)    Oral/Motor/Sensory Function Overall Oral Motor/Sensory Function: Generalized oral weakness (no focal CN deficits)   Ice Chips Ice chips: Impaired Presentation: Spoon;Self Fed Oral Phase Impairments: Reduced labial seal;Reduced lingual movement/coordination Oral Phase Functional Implications: Oral holding;Prolonged oral transit Pharyngeal Phase Impairments: Suspected delayed Swallow   Thin Liquid Thin Liquid: Impaired Presentation: Cup Oral Phase Impairments: Reduced labial seal;Reduced lingual movement/coordination Oral Phase Functional Implications: Prolonged oral transit;Oral holding Pharyngeal  Phase Impairments: Multiple swallows    Nectar Thick Nectar Thick Liquid: Impaired Presentation: Cup;Spoon Oral Phase Impairments: Reduced lingual movement/coordination Oral phase functional implications: Prolonged oral transit Pharyngeal Phase Impairments: Wet Vocal Quality;Multiple swallows Other Comments: wet voice cleared with cued throat clear and re=swallow- since occured early in evaluation suspect may be related to secretion aspiration   Honey Thick Honey Thick Liquid: Not tested   Puree Puree: Impaired Presentation: Self Fed;Spoon Oral Phase Impairments: Reduced lingual movement/coordination Oral Phase Functional Implications: Prolonged oral transit Pharyngeal Phase Impairments: Multiple swallows;Other (comments) Other Comments: pt c/o  sensation of residuals in pharynx, cleared with cued dry swallow   Solid   GO   Solid: Not tested Other Comments: DNT due to pt's lethargy and lack of lower dentures present        Claudie Fisherman, [facility] SLP [phone]

## 2016-07-12 NOTE — Plan of Care (Signed)
Problem: Education: Goal: Knowledge of [place] [facility] Education information/materials will improve Outcome: Not Progressing Pt lethargic

## 2016-07-12 NOTE — Progress Notes (Signed)
Patient ID: [name], male   DOB: February 26, 1941, 75 y.o.   MRN: [id number]  PROGRESS NOTE    [name]  [name] DOB: 19-Sep-1941 DOA: 07/09/2016  PCP: Odis Luster, MD   Brief Narrative:  75 y.o.malewith past medical history of alcoholism and alcohol-related seizures who presented to [facility] long hospital intoxicated. He apparently fell which brought him to ED. CT head and neck done on admission was unremarkable. Chest x-ray showed fractures of the 10th, 11th and 12th ribs. His blood work was notable for hyponatremia. Alcohol level was 292.   Assessment & Plan:  Fall / Rib fractures - Appreciate PT evaluation: Recommendation for skilled nursing facility placement - Seen by trauma surgery; continue supportive care per their recommendation - Continue pain management efforts - Per PT eval - recommendations for SNF placement   Acute alcohol intoxication / Acute metabolic encephalopathy  - Altered mental status likely secondary to acute alcohol intoxication - Continue CIWA protocol - Continue thiamine, folic acid and multivitamin - No reports of withdrawal   Hyponatremia - Likely related to history of alcohol abuse - Sodium level 124, 123 - Still on NS but will change to D5 water today - Check BMP in am  Leukopenia - Likely bone marrow suppression from alcohol abuse - White blood cell count subsequently normalized  Severe protein calorie malnutrition - Continue nutritional supplementation - SLP evaluation pending  DVT prophylaxis: SCD's bilaterally  Code Status: full code  Family Communication: Sister at the bedside Disposition Plan: To skilled nursing facility once sodium closer to [facility]   Consultants:   Surgery  SLP  PT  Procedures:   None  Antimicrobials:   None    Subjective: No overnight events.   Objective: Vitals:   07/11/16 0632 07/11/16 1433 07/11/16 2202 07/12/16 0627  BP: 132/80 130/78 119/79 122/74  Pulse:  72 83 79    Resp: 18 16 16 16   Temp: 97.7 F (36.5 C) 98.1 F (36.7 C) 98.2 F (36.8 C) 97.5 F (36.4 C)  TempSrc: Axillary Axillary Oral Oral  SpO2: 97% 96% 97% 96%  Weight:      Height:        Intake/Output Summary (Last 24 hours) at 07/12/16 1143 Last data filed at 07/12/16 1039  Gross per 24 hour  Intake           1057.5 ml  Output             1000 ml  Net             57.5 ml   Filed Weights   07/09/16 1925 07/10/16 0015  Weight: 64 kg (141 lb) 63.9 kg (140 lb 14 oz)    Examination:  General exam: No distress Respiratory system: No wheezing, no rhonchi Cardiovascular system: S1 & S2 heard, RRR Gastrointestinal system: (+) BS, non tender  Central nervous system: Nonfocal  Extremities: No edema, palpable pulses  Skin: Warm, dry  Psychiatry: Little better this am, more alert   Data Reviewed: I have personally reviewed following labs and imaging studies  CBC:  Recent Labs Lab 07/09/16 2003 07/10/16 0454 07/11/16 0521  WBC 3.7* 3.5* 8.2  NEUTROABS 2.0 2.0  --   HGB 12.3* 12.8* 13.6  HCT 33.6* 34.1* 35.2*  MCV 83.2 82.4 80.0  PLT 208 171 [id number]   Basic Metabolic Panel:  Recent Labs Lab 07/09/16 2003 07/10/16 0454 07/11/16 0521 07/12/16 0509  NA 125* 129* 124* 123*  K 3.5 3.8 4.5 4.2  CL 92* 99* 91*  90*  CO2 25 22 22 24   GLUCOSE 115* 89 122* 136*  BUN 7 <5* 5* 7  CREATININE 0.63 0.50* 0.73 0.58*  CALCIUM 8.1* 7.6* 8.6* 8.7*   GFR: Estimated Creatinine Clearance: 68.3 mL/min (by C-G formula based on SCr of 0.58 mg/dL (L)). Liver Function Tests:  Recent Labs Lab 07/10/16 0454 07/12/16 0509  AST 27 37  ALT 9* 12*  ALKPHOS 64 76  BILITOT 0.4 1.1  PROT 6.3* 7.4  ALBUMIN 3.1* 3.4*   No results for input(s): LIPASE, AMYLASE in the last 168 hours.  Recent Labs Lab 07/10/16 1025  AMMONIA 14   Coagulation Profile:  Recent Labs Lab 07/09/16 2003  INR 1.01   Cardiac Enzymes:  Recent Labs Lab 07/10/16 0454 07/10/16 1025 07/10/16 1701  CKTOTAL  271  --   --   TROPONINI <0.03 <0.03 <0.03   BNP (last 3 results) No results for input(s): PROBNP in the last 8760 hours. HbA1C: No results for input(s): HGBA1C in the last 72 hours. CBG: No results for input(s): GLUCAP in the last 168 hours. Lipid Profile: No results for input(s): CHOL, HDL, LDLCALC, TRIG, CHOLHDL, LDLDIRECT in the last 72 hours. Thyroid Function Tests:  Recent Labs  07/10/16 0454  TSH 2.799   Anemia Panel:  Recent Labs  07/10/16 0454  VITAMINB12 510  FOLATE 11.1  FERRITIN 120  TIBC 224*  IRON 73  RETICCTPCT 1.5   Urine analysis:    Component Value Date/Time   COLORURINE YELLOW 07/14/2015 1352   APPEARANCEUR CLEAR 07/14/2015 1352   LABSPEC 1.015 07/14/2015 1352   PHURINE 7.5 07/14/2015 1352   GLUCOSEU NEGATIVE 07/14/2015 1352   HGBUR NEGATIVE 07/14/2015 1352   BILIRUBINUR NEGATIVE 07/14/2015 1352   KETONESUR NEGATIVE 07/14/2015 1352   PROTEINUR NEGATIVE 07/14/2015 1352   UROBILINOGEN 2.0 (H) 07/14/2015 1352   NITRITE NEGATIVE 07/14/2015 1352   LEUKOCYTESUR NEGATIVE 07/14/2015 1352   Sepsis Labs: @LABRCNTIP (procalcitonin:4,lacticidven:4)   )No results found for this or any previous visit (from the past 240 hour(s)).    Radiology Studies: Dg Ribs Unilateral W/chest Left Result Date: 07/09/2016 1. Acute fractures of anterior lateral tenth, eleventh, and twelfth ribs, tenth and twelfth rib fractures are minimally displaced. No pneumothorax. 2. Emphysema. Interstitial prominence at the lung bases, left greater than right, may be fibrosis.   Ct Head Wo Contrast Result Date: 07/09/2016 1. No evidence of acute intracranial or cervical spine injury. 2. Advanced cortical atrophy with parietal predilection. 3.  Emphysema.   Ct Chest W Contrast Result Date: 07/10/2016  1. Acute lower left rib fractures as demonstrated radiographically. No evidence of pneumothorax. 2. Small bilateral pleural effusions. 3. 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 4. No acute findings demonstrated within the abdomen or pelvis. 5.  Aortic Atherosclerosis   Ct Cervical Spine Wo Contrast Result Date: 07/09/2016 1. No evidence of acute intracranial or cervical spine injury. 2. Advanced cortical atrophy with parietal predilection. 3.  Emphysema. (  Ct Abdomen Pelvis W Contrast Result Date: 07/10/2016 1. Acute lower left rib fractures as demonstrated radiographically. No evidence of pneumothorax. 2. Small bilateral pleural effusions. 3. 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 4. No acute findings demonstrated within the abdomen or pelvis. 5.  Aortic Atherosclerosis   Dg Pelvis Portable Result Date: 07/10/2016 No acute osseous findings identified. Degenerative changes as described.   Dg Chest Port 1 View Result Date: 07/10/2016  Developing pulmonary edema. Left-sided rib fractures better characterized on prior plain film of the  chest. There are additional nondisplaced fractures of lower right-sided ribs, which appear to be subacute with healing/ callus formation.     Scheduled Meds: . feeding supplement (ENSURE ENLIVE)  237 mL Oral BID BM  . folic acid  1 mg Oral Daily  . LORazepam  0-4 mg Oral Q12H  . multivitamin with minerals  1 tablet Oral Daily  . [START ON 07/13/2016] phenytoin  100 mg Oral TID  . thiamine  100 mg Oral Daily   Or  . thiamine  100 mg Intravenous Daily   Continuous Infusions: . dextrose 5 % 1,000 mL infusion       LOS: 2 days    Time spent: 15 minutes  Greater than 50% of the time spent on counseling and coordinating the care.   Leisa Lenz, MD [facility] Pager [phone]  If 7PM-7AM, please contact night-coverage [email or web address] Password [facility] 07/12/2016, 11:43 AM

## 2016-07-12 NOTE — Progress Notes (Addendum)
MEDICATION RELATED CONSULT NOTE  Pharmacy Consult for Phenytoin Load + Maintenance Indication: seizures, noncompliance  No Known Allergies  Patient Measurements: Height: 5\' 5"  (165.1 cm) Weight: 140 lb 14 oz (63.9 kg) IBW/kg (Calculated) : 61.5  Vital Signs: Temp: 97.5 F (36.4 C) (09/13 0627) Temp Source: Oral (09/13 0627) BP: 122/74 (09/13 0627) Pulse Rate: 79 (09/13 0627) Intake/Output from previous day: 09/12 0701 - 09/13 0700 In: 725 [I.V.:725] Out: 1000 [Urine:1000] Intake/Output from this shift: No intake/output data recorded.  Labs:  Recent Labs  07/09/16 2003 07/10/16 0454 07/11/16 0521 07/12/16 0509  WBC 3.7* 3.5* 8.2  --   HGB 12.3* 12.8* 13.6  --   HCT 33.6* 34.1* 35.2*  --   PLT 208 171 238  --   CREATININE 0.63 0.50* 0.73 0.58*  ALBUMIN  --  3.1*  --  3.4*  PROT  --  6.3*  --  7.4  AST  --  27  --  37  ALT  --  9*  --  12*  ALKPHOS  --  64  --  Terry  BILITOT  --  0.4  --  1.1   Estimated Creatinine Clearance: 68.3 mL/min (by C-G formula based on SCr of 0.58 mg/dL (L)).   Microbiology: No results found for this or any previous visit (from the past 720 hour(s)).  Medications:  Prescriptions Prior to Admission  Medication Sig Dispense Refill Last Dose  . oxyCODONE-acetaminophen (PERCOCET/ROXICET) 5-325 MG tablet Take 1 tablet by mouth every 4 (four) hours as needed for severe pain. (Patient not taking: Reported on 05/11/2016) 15 tablet 0 Not Taking at Unknown time  . phenytoin (DILANTIN) 200 MG ER capsule Take 200 mg by mouth 3 (three) times daily as needed (seizures).    unknown   Scheduled:  . feeding supplement (ENSURE ENLIVE)  237 mL Oral BID BM  . folic acid  1 mg Oral Daily  . LORazepam  0-4 mg Oral Q12H  . multivitamin with minerals  1 tablet Oral Daily  . thiamine  100 mg Oral Daily   Or  . thiamine  100 mg Intravenous Daily    Assessment: Terry Cartermalewith alcoholism and alcohol-related seizures brought to ER after fall at home.   Intoxicated and noted with 3 fractured ribs. Pharmacy consulted to dose phenytoin, loading with fosphenytoin. Patient reports taking phenytoin "as needed for seizures" at home. LFTs, SCr wnl, albumin moderately low at 3.1  Significant Events  9/10: Phenytoin level undetectable (< 2.5 mcg/mL)  9/12: AM phenytoin level 24.3 mcg/mL (corrects to 33.8 based on albumin 3.1)  Today, 07/12/2016:  AM phenytoin level 19.5 mcg/mL (corrects to 25 based on albumin 3.4)  No reports of additional seizure activity  LFTs, SCr remain wnl  Goal of Therapy:  Phenytoin level 10-20 mcg/mL  Plan:   Hold PO phenytoin for today and resume 9/14 at 100 mg TID.   Recommend phenytoin level recheck 9/21 and titration by no more than 100 mg/day to achieve levels 10-20 mcg/mL  Provided daily dose < 400 mg and ER capsules used, may consider qHS dosing to improve compliance after discharge  Reuel Boom, PharmD, BCPS Pager: [phone] 07/12/2016, 7:28 AM

## 2016-07-13 LAB — BASIC METABOLIC PANEL
ANION GAP: 9 (ref 5–15)
BUN: 5 mg/dL — ABNORMAL LOW (ref 6–20)
CALCIUM: 8.5 mg/dL — AB (ref 8.9–10.3)
CO2: 23 mmol/L (ref 22–32)
Chloride: 90 mmol/L — ABNORMAL LOW (ref 101–111)
Creatinine, Ser: 0.56 mg/dL — ABNORMAL LOW (ref 0.61–1.24)
Glucose, Bld: 131 mg/dL — ABNORMAL HIGH (ref 65–99)
POTASSIUM: 3.6 mmol/L (ref 3.5–5.1)
SODIUM: 122 mmol/L — AB (ref 135–145)

## 2016-07-13 LAB — SODIUM, URINE, RANDOM: SODIUM UR: 30 mmol/L

## 2016-07-13 LAB — OSMOLALITY, URINE: OSMOLALITY UR: 206 mosm/kg — AB (ref 300–900)

## 2016-07-13 LAB — OSMOLALITY: OSMOLALITY: 250 mosm/kg — AB (ref 275–295)

## 2016-07-13 MED ORDER — PHENYTOIN 125 MG/5ML PO SUSP
100.0000 mg | Freq: Three times a day (TID) | ORAL | Status: DC
  Administered 2016-07-13 – 2016-07-15 (×7): 100 mg via ORAL
  Filled 2016-07-13 (×9): qty 4

## 2016-07-13 MED ORDER — ADULT MULTIVITAMIN LIQUID CH
15.0000 mL | Freq: Every day | ORAL | Status: DC
  Administered 2016-07-13 – 2016-07-15 (×2): 15 mL via ORAL
  Filled 2016-07-13 (×3): qty 15

## 2016-07-13 NOTE — Progress Notes (Signed)
Patient ID: [name], male   DOB: 07-Sep-1941, 75 y.o.   MRN: [id number]  PROGRESS NOTE    [name]  [name] DOB: [birth year] DOA: 07/09/2016  PCP: Odis Luster, MD   Brief Narrative:  75 y.o.malewith past medical history of alcoholism and alcohol-related seizures who presented to [facility] hospital intoxicated. He apparently fell which brought him to ED. CT head and neck done on admission was unremarkable. Chest x-ray showed fractures of the 10th, 11th and 12th ribs. His blood work was notable for hyponatremia. Alcohol level was 292.   Assessment & Plan:  Fall / Rib fractures - Appreciate PT evaluation: Recommendation for skilled nursing facility placement - Seen by trauma surgery; continue supportive care per their recommendation - Continue pain management efforts  Acute alcohol intoxication / Acute metabolic encephalopathy  - Altered mental status likely secondary to acute alcohol intoxication - Continue CIWA protocol - Continue thiamine, folic acid and multivitamin  Hyponatremia - Likely related to history of alcohol abuse - Sodium level 124, 123, 122 - His baseline is 129 - Stop all IV fluids and monitor sodium level - Obtain urine sodium, urine and plasma osmolarity   Leukopenia - Likely bone marrow suppression from alcohol abuse - White blood cell count subsequently normalized  Severe protein calorie malnutrition - Continue nutritional supplementation - SLP evaluation pending  DVT prophylaxis: SCD's bilaterally  Code Status: full code  Family Communication: Sister at the bedside Disposition Plan: To skilled nursing facility once sodium closer to 129   Consultants:   Surgery  SLP  PT  Procedures:   None  Antimicrobials:   None    Subjective: No overnight events.   Objective: Vitals:   07/12/16 1226 07/12/16 2103 07/12/16 2322 07/13/16 0651  BP: 132/75 (!) 169/78 126/66 114/69  Pulse: 81 (!) 108 74 75  Resp: 16 17  16 18   Temp: 97.5 F (36.4 C) 98.2 F (36.8 C) 98.2 F (36.8 C) 97.6 F (36.4 C)  TempSrc: Oral Oral Oral Oral  SpO2: 95% 92% 96% 96%  Weight:      Height:        Intake/Output Summary (Last 24 hours) at 07/13/16 1202 Last data filed at 07/13/16 1000  Gross per 24 hour  Intake             1670 ml  Output              925 ml  Net              745 ml   Filed Weights   07/09/16 1925 07/10/16 0015  Weight: 64 kg (141 lb) 63.9 kg (140 lb 14 oz)    Examination:  General exam: No distress Respiratory system: Bilateral air entry, no wheezing  Cardiovascular system: S1 & S2 heard, rate controlled  Gastrointestinal system: (+) BS, non tender  Central nervous system: No focal   Extremities: No edema, palpable pulses  Skin: Warm, dry  Psychiatry: normal mood and behavior   Data Reviewed: I have personally reviewed following labs and imaging studies  CBC:  Recent Labs Lab 07/09/16 2003 07/10/16 0454 07/11/16 0521  WBC 3.7* 3.5* 8.2  NEUTROABS 2.0 2.0  --   HGB 12.3* 12.8* 13.6  HCT 33.6* 34.1* 35.2*  MCV 83.2 82.4 80.0  PLT 208 171 [id number]   Basic Metabolic Panel:  Recent Labs Lab 07/09/16 2003 07/10/16 0454 07/11/16 0521 07/12/16 0509 07/13/16 0916  NA 125* 129* 124* 123* 122*  K 3.5 3.8 4.5 4.2 3.6  CL 92* 99* 91* 90* 90*  CO2 25 22 22 24 23   GLUCOSE 115* 89 122* 136* 131*  BUN 7 <5* 5* 7 5*  CREATININE 0.63 0.50* 0.73 0.58* 0.56*  CALCIUM 8.1* 7.6* 8.6* 8.7* 8.5*   GFR: Estimated Creatinine Clearance: 68.3 mL/min (by C-G formula based on SCr of 0.56 mg/dL (L)). Liver Function Tests:  Recent Labs Lab 07/10/16 0454 07/12/16 0509  AST 27 37  ALT 9* 12*  ALKPHOS 64 76  BILITOT 0.4 1.1  PROT 6.3* 7.4  ALBUMIN 3.1* 3.4*   No results for input(s): LIPASE, AMYLASE in the last 168 hours.  Recent Labs Lab 07/10/16 1025  AMMONIA 14   Coagulation Profile:  Recent Labs Lab 07/09/16 2003  INR 1.01   Cardiac Enzymes:  Recent Labs Lab  07/10/16 0454 07/10/16 1025 07/10/16 1701  CKTOTAL 271  --   --   TROPONINI <0.03 <0.03 <0.03   BNP (last 3 results) No results for input(s): PROBNP in the last 8760 hours. HbA1C: No results for input(s): HGBA1C in the last 72 hours. CBG: No results for input(s): GLUCAP in the last 168 hours. Lipid Profile: No results for input(s): CHOL, HDL, LDLCALC, TRIG, CHOLHDL, LDLDIRECT in the last 72 hours. Thyroid Function Tests: No results for input(s): TSH, T4TOTAL, FREET4, T3FREE, THYROIDAB in the last 72 hours. Anemia Panel: No results for input(s): VITAMINB12, FOLATE, FERRITIN, TIBC, IRON, RETICCTPCT in the last 72 hours. Urine analysis:    Component Value Date/Time   COLORURINE YELLOW 07/14/2015 [place] 07/14/2015 1352   LABSPEC 1.015 07/14/2015 1352   PHURINE 7.5 07/14/2015 1352   GLUCOSEU NEGATIVE 07/14/2015 1352   HGBUR NEGATIVE 07/14/2015 1352   BILIRUBINUR NEGATIVE 07/14/2015 1352   KETONESUR NEGATIVE 07/14/2015 1352   PROTEINUR NEGATIVE 07/14/2015 1352   UROBILINOGEN 2.0 (H) 07/14/2015 1352   NITRITE NEGATIVE 07/14/2015 1352   LEUKOCYTESUR NEGATIVE 07/14/2015 1352   Sepsis Labs: @LABRCNTIP (procalcitonin:4,lacticidven:4)   )No results found for this or any previous visit (from the past 240 hour(s)).    Radiology Studies: Dg Ribs Unilateral W/chest Left Result Date: 07/09/2016 1. Acute fractures of anterior lateral tenth, eleventh, and twelfth ribs, tenth and twelfth rib fractures are minimally displaced. No pneumothorax. 2. Emphysema. Interstitial prominence at the lung bases, left greater than right, may be fibrosis.   Ct Head Wo Contrast Result Date: 07/09/2016 1. No evidence of acute intracranial or cervical spine injury. 2. Advanced cortical atrophy with parietal predilection. 3.  Emphysema.   Ct Chest W Contrast Result Date: 07/10/2016  1. Acute lower left rib fractures as demonstrated radiographically. No evidence of pneumothorax. 2. Small  bilateral pleural effusions. 3. 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 4. No acute findings demonstrated within the abdomen or pelvis. 5.  Aortic Atherosclerosis   Ct Cervical Spine Wo Contrast Result Date: 07/09/2016 1. No evidence of acute intracranial or cervical spine injury. 2. Advanced cortical atrophy with parietal predilection. 3.  Emphysema. (  Ct Abdomen Pelvis W Contrast Result Date: 07/10/2016 1. Acute lower left rib fractures as demonstrated radiographically. No evidence of pneumothorax. 2. Small bilateral pleural effusions. 3. 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 4. No acute findings demonstrated within the abdomen or pelvis. 5.  Aortic Atherosclerosis   Dg Pelvis Portable Result Date: 07/10/2016 No acute osseous findings identified. Degenerative changes as described.   Dg Chest Port 1 View Result Date: 07/10/2016  Developing pulmonary edema. Left-sided rib fractures better characterized on prior plain film  of the chest. There are additional nondisplaced fractures of lower right-sided ribs, which appear to be subacute with healing/ callus formation.     Scheduled Meds: . feeding supplement (ENSURE ENLIVE)  237 mL Oral BID BM  . folic acid  1 mg Oral Daily  . multivitamin  15 mL Oral Daily  . phenytoin  100 mg Oral TID  . thiamine  100 mg Oral Daily   Or  . thiamine  100 mg Intravenous Daily   Continuous Infusions:     LOS: 3 days    Time spent: 15 minutes  Greater than 50% of the time spent on counseling and coordinating the care.   Leisa Lenz, MD [facility] Pager [phone]  If 7PM-7AM, please contact night-coverage [email or web address] Password [facility] 07/13/2016, 12:02 PM

## 2016-07-13 NOTE — Progress Notes (Signed)
Speech Language Pathology Treatment: Dysphagia  Patient Details Name: [name] MRN: [id number] DOB: 1941/02/08 Today's Date: 07/13/2016 Time: [id number] SLP Time Calculation (min) (ACUTE ONLY): 18 min  Assessment / Plan / Recommendation Clinical Impression  Per RN, pt needs to be seen to determine if he can tolerate diet advancement.  Baseline cough noted when head of bed raised - suspect secretion retention/aspiration.  SLP provided pt with soft graham cracker, ice cream, Ensure and ice chips. Prolonged oral transiting continues with suspected delayed pharyngeal swallow initiation.   Pt required greater than 2 1/2 minutes to masticate/transit moist solid= resulting in inefficiency and increased aspiration risk. He also can not self feed at this time due to weakness.    Intermittent throat clearing noted with intake = which cleared with cued coughing.  Do not recommend advance liquids due to pt's premorbid dysphagia to liquids.    Recommend advance diet to dys1/puree/nectar diet with very strict precautions. Oral suction before and after intake,dry swallow after two boluses of liquids.   SLP to follow up for family education/tolerance/indication for instrumental evaluation.  Pt and RN informed.     HPI HPI: 75 yo male with past medical history of alcoholism and alcohol-related seizures who presented to [facility] long hospital intoxicated. He apparently fell which brought him to ED. CT head and neck done on admission was unremarkable. Chest x-ray showed fractures of the 10th, 11th and 12th ribs. His blood work was notable for hyponatremia per Md notes.  Pt CT chest showed bilateral pleural effusions.  Swallow evaluation ordered as pt has been lethargic and having difficulty swallowing.  Pt admits to some dysphagia prior to admission resulting in some coughing - more with liquids than food per pt.           SLP Plan  Continue with current plan of care     Recommendations  Diet recommendations:  Dysphagia 1 (puree);Nectar-thick liquid Liquids provided via: Cup Medication Administration: Crushed with puree Supervision: Full supervision/cueing for compensatory strategies Compensations: Slow rate;Small sips/bites;Clear throat intermittently (intermittent dry swallow, cued cough/throat clear if voice wet) Postural Changes and/or Swallow Maneuvers: Seated upright 90 degrees;Upright 30-60 min after meal (oral suction before and after intake)             Oral Care Recommendations: Oral care BID Follow up Recommendations: Skilled Nursing facility Plan: Continue with current plan of care     [place], [place], [facility] SLP [phone]

## 2016-07-13 NOTE — Clinical Social Work Note (Signed)
Patient has bed at [facility], facility has obtained insurance authorization for admission tomorrow, 9/15. MSW provided patient's sister, [name] with facility address and contact information, she is aware patient will dc on Friday, 9/15.   MSW communicated to MD and bedside RN that mittens will need to be discontinued for 24 hours prior to dc per facility rep.   MSW remains available as needed.   Glendon Axe, MSW [phone] 07/13/2016 11:35 AM

## 2016-07-14 LAB — BASIC METABOLIC PANEL
Anion gap: 8 (ref 5–15)
BUN: 7 mg/dL (ref 6–20)
CALCIUM: 8.5 mg/dL — AB (ref 8.9–10.3)
CHLORIDE: 91 mmol/L — AB (ref 101–111)
CO2: 23 mmol/L (ref 22–32)
CREATININE: 0.57 mg/dL — AB (ref 0.61–1.24)
GFR calc non Af Amer: >60 mL/min (ref >60)
Glucose, Bld: 97 mg/dL (ref 65–99)
Potassium: 3.8 mmol/L (ref 3.5–5.1)
SODIUM: 122 mmol/L — AB (ref 135–145)

## 2016-07-14 MED ORDER — SODIUM CHLORIDE 1 G PO TABS
1.0000 g | ORAL_TABLET | Freq: Two times a day (BID) | ORAL | Status: DC
  Administered 2016-07-14 – 2016-07-15 (×3): 1 g via ORAL
  Filled 2016-07-14 (×3): qty 1

## 2016-07-14 NOTE — Progress Notes (Signed)
Speech Language Pathology Treatment: Dysphagia  Patient Details Name: [name] MRN: [id number] DOB: [birth year] Today's Date: 07/14/2016 Time: [id number] SLP Time Calculation (min) (ACUTE ONLY): 27 min  Assessment / Plan / Recommendation Clinical Impression  Pt today appears more sleepy than during yesterday's session.   Suspect pt will benefit from dys1/nectar diet and SLP follow up at SNF for dysphagia management/treatment to assure least restricted diet is in place.  Oral delay and suspected pharyngeal delay ongoing =  SLP did not test advanced dietary items beyond dys1/nectar at this time.    Recommend continue diet with strict precautions - including dry swallow after 2 sips, cued coughing/throat clearing and re=swallow if voice is wet.     Educated pt to these recommendations but uncertain to his ability to comprehend information due to ETOH/Seizures, etc.  Will follow while in hospital.    HPI HPI: 75 yo male with past medical history of alcoholism and alcohol-related seizures who presented to [facility] hospital intoxicated. He apparently fell which brought him to ED. CT head and neck done on admission was unremarkable. Chest x-ray showed fractures of the 10th, 11th and 12th ribs. His blood work was notable for hyponatremia per Md notes.  Pt CT chest showed bilateral pleural effusions.  Swallow evaluation ordered as pt has been lethargic and having difficulty swallowing.  Pt admits to some dysphagia prior to admission resulting in some coughing - more with liquids than food per pt.           SLP Plan  Continue with current plan of care     Recommendations  Diet recommendations: Dysphagia 1 (puree);Nectar-thick liquid Liquids provided via: Cup Medication Administration: Crushed with puree Supervision: Full supervision/cueing for compensatory strategies Compensations: Slow rate;Small sips/bites;Clear throat intermittently (intermittent dry swallow, cued cough/throat clear if  voice wet) Postural Changes and/or Swallow Maneuvers: Seated upright 90 degrees;Upright 30-60 min after meal (oral suction before and after intake)               SLP Diagnosis  Oral Care Recommendations: Oral care BID Follow up Recommendations: Skilled Nursing facility Plan: Continue with current plan of care   Alcohol intoxication, with unspecified complication ([place])  Fall, initial encounter  Hyponatremia  Rib fracture, left, closed, initial encounter  Fall - Plan: DG CHEST PORT 1 VIEW, DG CHEST PORT 1 VIEW, DG Pelvis Portable, DG Pelvis Portable  Abdominal pain  Rib fractures, left, closed, initial encounter  Dysphagia, oropharyngeal phase     GO                Claudie Fisherman, [place] [facility] SLP [phone]

## 2016-07-14 NOTE — Progress Notes (Addendum)
Patient ID: [name], male   DOB: 1940/11/27, 75 y.o.   MRN: [id number]  PROGRESS NOTE    [name]  [name] DOB: 12/09/1940 DOA: 07/09/2016  PCP: Odis Luster, MD   Brief Narrative:  75 y.o.malewith past medical history of alcoholism and alcohol-related seizures who presented to [facility] intoxicated. He apparently fell which brought him to ED. CT head and neck done on admission was unremarkable. Chest x-ray showed fractures of the 10th, 11th and 12th ribs. His blood work was notable for hyponatremia. Alcohol level was 292.   Assessment & Plan:  Fall / Rib fractures - Appreciate PT evaluation: Recommendation for skilled nursing facility placement - Seen by trauma surgery; continue supportive care per their recommendation - Continue pain management efforts  Acute alcohol intoxication / Acute metabolic encephalopathy  - Altered mental status likely secondary to acute alcohol intoxication - Continue CIWA protocol - Continue thiamine, folic acid and multivitamin  Hyponatremia, hypo-osmolar  - Likely related to history of alcohol abuse - Sodium level 124, 123, 122, 122 - His baseline is 129 - Patient was on IV fluids since the admission and his sodium level gradually declined to 122 over past 48 hours - His urine osmolarity is 206 plasma osmolarity is 250, urine sodium is 30. His TSH is within normal limits, there is no abnormal masses seen on CT scan which essentially would exclude SIADH. He is not drinking too much if anything his by mouth intake is relatively diminished. Considering urine sodium of 30 this would essentially exclude dehydration and CHF as urine sodium is typically less than 20 in those particular cases - I think it's reasonable to start sodium tablets and monitor sodium level  Leukopenia - Likely bone marrow suppression from alcohol abuse - White blood cell count subsequently normalized  Severe protein calorie malnutrition -  Continue nutritional supplementation - SLP evaluation - nectar thick liquids  DVT prophylaxis: SCD's bilaterally  Code Status: full code  Family Communication: Sister at the bedside Disposition Plan: To skilled nursing facility once sodium closer to 129   Consultants:   Surgery  SLP  PT  Procedures:   None  Antimicrobials:   None    Subjective: No overnight events.   Objective: Vitals:   07/13/16 1200 07/13/16 1418 07/13/16 2033 07/14/16 0558  BP: (!) 100/54 117/60 130/78 132/65  Pulse: 86 88  65  Resp:  16 18 16   Temp:  98.9 F (37.2 C)  98.1 F (36.7 C)  TempSrc:  Oral  Oral  SpO2:  100% 99% 99%  Weight:      Height:        Intake/Output Summary (Last 24 hours) at 07/14/16 1137 Last data filed at 07/14/16 0600  Gross per 24 hour  Intake                0 ml  Output             1500 ml  Net            -1500 ml   Filed Weights   07/09/16 1925 07/10/16 0015  Weight: 64 kg (141 lb) 63.9 kg (140 lb 14 oz)    Examination:  General exam: No distress, calm and comfortable Respiratory system: Bilateral air entry, no wheezing  Cardiovascular system: S1 & S2 heard, rate controlled  Gastrointestinal system: (+) BS, non tender abdomen  Central nervous system: No focal deficits  Extremities: No swelling, palpable pulses  Skin: Warm, dry, no lesions Psychiatry: normal  mood and behavior, no agitation  Data Reviewed: I have personally reviewed following labs and imaging studies  CBC:  Recent Labs Lab 07/09/16 2003 07/10/16 0454 07/11/16 0521  WBC 3.7* 3.5* 8.2  NEUTROABS 2.0 2.0  --   HGB 12.3* 12.8* 13.6  HCT 33.6* 34.1* 35.2*  MCV 83.2 82.4 80.0  PLT 208 171 [id number]   Basic Metabolic Panel:  Recent Labs Lab 07/10/16 0454 07/11/16 0521 07/12/16 0509 07/13/16 0916 07/14/16 0837  NA 129* 124* 123* 122* 122*  K 3.8 4.5 4.2 3.6 3.8  CL 99* 91* 90* 90* 91*  CO2 22 22 24 23 23   GLUCOSE 89 122* 136* 131* 97  BUN <5* 5* 7 5* 7  CREATININE 0.50*  0.73 0.58* 0.56* 0.57*  CALCIUM 7.6* 8.6* 8.7* 8.5* 8.5*   GFR: Estimated Creatinine Clearance: 68.3 mL/min (by C-G formula based on SCr of 0.57 mg/dL (L)). Liver Function Tests:  Recent Labs Lab 07/10/16 0454 07/12/16 0509  AST 27 37  ALT 9* 12*  ALKPHOS 64 76  BILITOT 0.4 1.1  PROT 6.3* 7.4  ALBUMIN 3.1* 3.4*   No results for input(s): LIPASE, AMYLASE in the last 168 hours.  Recent Labs Lab 07/10/16 1025  AMMONIA 14   Coagulation Profile:  Recent Labs Lab 07/09/16 2003  INR 1.01   Cardiac Enzymes:  Recent Labs Lab 07/10/16 0454 07/10/16 1025 07/10/16 1701  CKTOTAL 271  --   --   TROPONINI <0.03 <0.03 <0.03   BNP (last 3 results) No results for input(s): PROBNP in the last 8760 hours. HbA1C: No results for input(s): HGBA1C in the last 72 hours. CBG: No results for input(s): GLUCAP in the last 168 hours. Lipid Profile: No results for input(s): CHOL, HDL, LDLCALC, TRIG, CHOLHDL, LDLDIRECT in the last 72 hours. Thyroid Function Tests: No results for input(s): TSH, T4TOTAL, FREET4, T3FREE, THYROIDAB in the last 72 hours. Anemia Panel: No results for input(s): VITAMINB12, FOLATE, FERRITIN, TIBC, IRON, RETICCTPCT in the last 72 hours. Urine analysis:    Component Value Date/Time   COLORURINE YELLOW 07/14/2015 Frannie 07/14/2015 1352   LABSPEC 1.015 07/14/2015 1352   PHURINE 7.5 07/14/2015 1352   GLUCOSEU NEGATIVE 07/14/2015 1352   HGBUR NEGATIVE 07/14/2015 1352   BILIRUBINUR NEGATIVE 07/14/2015 1352   KETONESUR NEGATIVE 07/14/2015 1352   PROTEINUR NEGATIVE 07/14/2015 1352   UROBILINOGEN 2.0 (H) 07/14/2015 1352   NITRITE NEGATIVE 07/14/2015 1352   LEUKOCYTESUR NEGATIVE 07/14/2015 1352   Sepsis Labs: @LABRCNTIP (procalcitonin:4,lacticidven:4)   )No results found for this or any previous visit (from the past 240 hour(s)).    Radiology Studies: Dg Ribs Unilateral W/chest Left Result Date: 07/09/2016 1. Acute fractures of anterior  lateral tenth, eleventh, and twelfth ribs, tenth and twelfth rib fractures are minimally displaced. No pneumothorax. 2. Emphysema. Interstitial prominence at the lung bases, left greater than right, may be fibrosis.   Ct Head Wo Contrast Result Date: 07/09/2016 1. No evidence of acute intracranial or cervical spine injury. 2. Advanced cortical atrophy with parietal predilection. 3.  Emphysema.   Ct Chest W Contrast Result Date: 07/10/2016  1. Acute lower left rib fractures as demonstrated radiographically. No evidence of pneumothorax. 2. Small bilateral pleural effusions. 3. 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 4. No acute findings demonstrated within the abdomen or pelvis. 5.  Aortic Atherosclerosis   Ct Cervical Spine Wo Contrast Result Date: 07/09/2016 1. No evidence of acute intracranial or cervical spine injury. 2. Advanced cortical atrophy with parietal  predilection. 3.  Emphysema.   Ct Abdomen Pelvis W Contrast Result Date: 07/10/2016 1. Acute lower left rib fractures as demonstrated radiographically. No evidence of pneumothorax. 2. Small bilateral pleural effusions. 3. 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 4. No acute findings demonstrated within the abdomen or pelvis. 5.  Aortic Atherosclerosis   Dg Pelvis Portable Result Date: 07/10/2016 No acute osseous findings identified. Degenerative changes as described.   Dg Chest Port 1 View Result Date: 07/10/2016  Developing pulmonary edema. Left-sided rib fractures better characterized on prior plain film of the chest. There are additional nondisplaced fractures of lower right-sided ribs, which appear to be subacute with healing/ callus formation.     Scheduled Meds: . feeding supplement (ENSURE ENLIVE)  237 mL Oral BID BM  . folic acid  1 mg Oral Daily  . multivitamin  15 mL Oral Daily  . phenytoin  100 mg Oral TID  . sodium chloride  1 g Oral BID WC  . thiamine  100 mg Oral  Daily   Or  . thiamine  100 mg Intravenous Daily   Continuous Infusions:     LOS: 4 days    Time spent: 25 minutes  Greater than 50% of the time spent on counseling and coordinating the care.   Leisa Lenz, MD [facility] Pager [phone]  If 7PM-7AM, please contact night-coverage [email or web address] Password [facility] 07/14/2016, 11:37 AM

## 2016-07-14 NOTE — Care Management Important Message (Signed)
Important Message  Patient Details  Name: [name] MRN: [id number] Date of Birth: 1941/07/26   Medicare Important Message Given:  Yes    Camillo Flaming 07/14/2016, 10:12 AMImportant Message  Patient Details  Name: [name] MRN: [id number] Date of Birth: 02-27-1941   Medicare Important Message Given:  Yes    Camillo Flaming 07/14/2016, 10:12 AM

## 2016-07-14 NOTE — Clinical Social Work Note (Signed)
Per MD, patient is not medically stable for dc, would like sodium levels at baseline.   Patient has bed at [facility] [place], [facility] authorization obtained and will still be active for Sat, 9/16 discharge. Facility prepared to admitted this weekend.   Sister, [name] main contact. No further concerns reported at this time.   MSW remains available as needed.   Glendon Axe, MSW [phone] 07/14/2016 1:15 PM

## 2016-07-15 LAB — CBC
HEMATOCRIT: 33.9 % — AB (ref 39.0–52.0)
Hemoglobin: 12.8 g/dL — ABNORMAL LOW (ref 13.0–17.0)
MCH: 31.2 pg (ref 26.0–34.0)
MCHC: 37.8 g/dL — AB (ref 30.0–36.0)
MCV: 82.7 fL (ref 78.0–100.0)
PLATELETS: 254 10*3/uL (ref 150–400)
RBC: 4.1 MIL/uL — ABNORMAL LOW (ref 4.22–5.81)
RDW: 14.1 % (ref 11.5–15.5)
WBC: 7.7 10*3/uL (ref 4.0–10.5)

## 2016-07-15 LAB — BASIC METABOLIC PANEL
Anion gap: 7 (ref 5–15)
BUN: 10 mg/dL (ref 6–20)
CALCIUM: 8.7 mg/dL — AB (ref 8.9–10.3)
CO2: 26 mmol/L (ref 22–32)
CREATININE: 0.67 mg/dL (ref 0.61–1.24)
Chloride: 94 mmol/L — ABNORMAL LOW (ref 101–111)
GFR calc Af Amer: >60 mL/min (ref >60)
GLUCOSE: 91 mg/dL (ref 65–99)
POTASSIUM: 4.1 mmol/L (ref 3.5–5.1)
Sodium: 127 mmol/L — ABNORMAL LOW (ref 135–145)

## 2016-07-15 MED ORDER — SODIUM CHLORIDE 1 G PO TABS: 1.0000 g | ORAL_TABLET | Freq: Two times a day (BID) | ORAL | 0 refills | Status: DC

## 2016-07-15 MED ORDER — ADULT MULTIVITAMIN LIQUID CH: 15.0000 mL | Freq: Every day | ORAL | 0 refills | Status: DC

## 2016-07-15 MED ORDER — FOLIC ACID 1 MG PO TABS: 1.0000 mg | ORAL_TABLET | Freq: Every day | ORAL | 0 refills | Status: DC

## 2016-07-15 MED ORDER — ENSURE ENLIVE PO LIQD: 237.0000 mL | Freq: Two times a day (BID) | ORAL | 12 refills | Status: DC

## 2016-07-15 MED ORDER — ONDANSETRON HCL 4 MG PO TABS: 4.0000 mg | ORAL_TABLET | Freq: Four times a day (QID) | ORAL | 0 refills | Status: DC | PRN

## 2016-07-15 MED ORDER — THIAMINE HCL 100 MG PO TABS: 100.0000 mg | ORAL_TABLET | Freq: Every day | ORAL | 0 refills | Status: DC

## 2016-07-15 MED ORDER — PHENYTOIN 100 MG/4ML PO SUSP: 100.0000 mg | Freq: Three times a day (TID) | ORAL | 1 refills | Status: DC

## 2016-07-15 NOTE — Discharge Instructions (Signed)
Alcohol Abuse and Nutrition Alcohol abuse is any pattern of alcohol consumption that harms your health, relationships, or work. Alcohol abuse can affect how your body breaks down and absorbs nutrients from food by causing your liver to work abnormally. Additionally, many people who abuse alcohol do not eat enough carbohydrates, protein, fat, vitamins, and minerals. This can cause poor nutrition (malnutrition) and a lack of nutrients (nutrient deficiencies), which can lead to further complications. Nutrients that are commonly lacking (deficient) among people who abuse alcohol include:  Vitamins.  Vitamin A. This is stored in your liver. It is important for your vision, metabolism, and ability to fight off infections (immunity).  B vitamins. These include vitamins such as folate, thiamin, and niacin. These are important in new cell growth and maintenance.  Vitamin C. This plays an important role in iron absorption, wound healing, and immunity.  Vitamin D. This is produced by your liver, but you can also get vitamin D from food. Vitamin D is necessary for your body to absorb and use calcium.  Minerals.  Calcium. This is important for your bones and your heart and blood vessel (cardiovascular) function.  Iron. This is important for blood, muscle, and nervous system functioning.  Magnesium. This plays an important role in muscle and nerve function, and it helps to control blood sugar and blood pressure.  Zinc. This is important for the normal function of your nervous system and digestive system (gastrointestinal tract). Nutrition is an essential component of therapy for alcohol abuse. Your health care provider or dietitian will work with you to design a plan that can help restore nutrients to your body and prevent potential complications. WHAT IS MY PLAN? Your dietitian may develop a specific diet plan that is based on your condition and any other complications you may have. A diet plan will  commonly include:  A balanced diet.  Grains: 6-8 oz per day.  Vegetables: 2-3 cups per day.  Fruits: 1-2 cups per day.  Meat and other protein: 5-6 oz per day.  Dairy: 2-3 cups per day.  Vitamin and mineral supplements. WHAT DO I NEED TO KNOW ABOUT ALCOHOL AND NUTRITION?  Consume foods that are high in antioxidants, such as grapes, berries, nuts, green tea, and dark green and orange vegetables. This can help to counteract some of the stress that is placed on your liver by consuming alcohol.  Avoid food and drinks that are high in fat and sugar. Foods such as sugared soft drinks, salty snack foods, and candy contain empty calories. This means that they lack important nutrients such as protein, fiber, and vitamins.  Eat frequent meals and snacks. Try to eat 5-6 small meals each day.  Eat a variety of fresh fruits and vegetables each day. This will help you get plenty of water, fiber, and vitamins in your diet.  Drink plenty of water and other clear fluids. Try to drink at least 48-64 oz (1.5-2 L) of water per day.  If you are a vegetarian, eat a variety of protein-rich foods. Pair whole grains with plant-based proteins at meals and snacks to obtain the greatest nutrient benefit from your food. For example, eat rice with beans, put peanut butter on whole-grain toast, or eat oatmeal with sunflower seeds.  Soak beans and whole grains overnight before cooking. This can help your body to absorb the nutrients more easily.  Include foods fortified with vitamins and minerals in your diet. Commonly fortified foods include milk, orange juice, cereal, and bread.  If you  are malnourished, your dietitian may recommend a high-protein, high-calorie diet. This may include:  2,000-3,000 calories (kilocalories) per day.  70-100 grams of protein per day.  Your health care provider may recommend a complete nutritional supplement beverage. This can help to restore calories, protein, and vitamins to  your body. Depending on your condition, you may be advised to consume this instead of or in addition to meals.  Limit your intake of caffeine. Replace drinks like coffee and black tea with decaffeinated coffee and herbal tea.  Eat a variety of foods that are high in omega fatty acids. These include fish, nuts and seeds, and soybeans. These foods may help your liver to recover and may also stabilize your mood.  Certain medicines may cause changes in your appetite, taste, and weight. Work with your health care provider and dietitian to make any adjustments to your medicines and diet plan.  Include other healthy lifestyle choices in your daily routine.  Be physically active.  Get enough sleep.  Spend time doing activities that you enjoy.  If you are unable to take in enough food and calories by mouth, your health care provider may recommend a feeding tube. This is a tube that passes through your nose and throat, directly into your stomach. Nutritional supplement beverages can be given to you through the feeding tube to help you get the nutrients you need.  Take vitamin or mineral supplements as recommended by your health care provider. WHAT FOODS CAN I EAT? Grains Enriched pasta. Enriched rice. Fortified whole-grain bread. Fortified whole-grain cereal. Barley. Brown rice. Quinoa. [place]. Vegetables All fresh, frozen, and canned vegetables. Spinach. Kale. Artichoke. Carrots. Winter squash and pumpkin. Sweet potatoes. Broccoli. Cabbage. Cucumbers. Tomatoes. Sweet peppers. Green beans. Peas. Corn. Fruits All fresh and frozen fruits. Berries. Grapes. Mango. Papaya. Guava. Cherries. Apples. Bananas. Peaches. Plums. Pineapple. Watermelon. Cantaloupe. Oranges. Avocado. Meats and Other Protein Sources Beef liver. Lean beef. Pork. Fresh and canned chicken. Fresh fish. Oysters. Sardines. Canned tuna. Shrimp. Eggs with yolks. Nuts and seeds. Peanut butter. Beans and lentils. Soybeans.  Tofu. Dairy Whole, low-fat, and nonfat milk. Whole, low-fat, and nonfat yogurt. Cottage cheese. Sour cream. Hard and soft cheeses. Beverages Water. Herbal tea. Decaffeinated coffee. Decaffeinated green tea. 100% fruit juice. 100% vegetable juice. Instant breakfast shakes. Condiments Ketchup. Mayonnaise. Mustard. Salad dressing. Barbecue sauce. Sweets and Desserts Sugar-free ice cream. Sugar-free pudding. Sugar-free gelatin. Fats and Oils Butter. Vegetable oil, flaxseed oil, olive oil, and walnut oil. Other Complete nutrition shakes. Protein bars. Sugar-free gum. The items listed above may not be a complete list of recommended foods or beverages. Contact your dietitian for more options. WHAT FOODS ARE NOT RECOMMENDED? Grains Sugar-sweetened breakfast cereals. Flavored instant oatmeal. Fried breads. Vegetables Breaded or deep-fried vegetables. Fruits Dried fruit with added sugar. Candied fruit. Canned fruit in syrup. Meats and Other Protein Sources Breaded or deep-fried meats. Dairy Flavored milks. Fried cheese curds or fried cheese sticks. Beverages Alcohol. Sugar-sweetened soft drinks. Sugar-sweetened tea. Caffeinated coffee and tea. Condiments Sugar. Honey. Agave nectar. Molasses. Sweets and Desserts Chocolate. Cake. Cookies. Candy. Other Potato chips. Pretzels. Salted nuts. Candied nuts. The items listed above may not be a complete list of foods and beverages to avoid. Contact your dietitian for more information.   This information is not intended to replace advice given to you by your health care provider. Make sure you discuss any questions you have with your health care provider.   Document Released: 08/10/2005 Document Revised: 11/06/2014 Document Reviewed: 05/19/2014 Elsevier Interactive Patient  Education 2016 [employer].   Alcohol Intoxication Alcohol intoxication occurs when you drink enough alcohol that it affects your ability to function. It can be mild or very  severe. Drinking a lot of alcohol in a short time is called binge drinking. This can be very harmful. Drinking alcohol can also be more dangerous if you are taking medicines or other drugs. Some of the effects caused by alcohol may include:  Loss of coordination.  Changes in mood and behavior.  Unclear thinking.  Trouble talking (slurred speech).  Throwing up (vomiting).  Confusion.  Slowed breathing.  Twitching and shaking (seizures).  Loss of consciousness. HOME CARE  Do not drive after drinking alcohol.  Drink enough water and fluids to keep your pee (urine) clear or pale yellow. Avoid caffeine.  Only take medicine as told by your doctor. GET HELP IF:  You throw up (vomit) many times.  You do not feel better after a few days.  You frequently have alcohol intoxication. Your doctor can help decide if you should see a substance use treatment counselor. GET HELP RIGHT AWAY IF:  You become shaky when you stop drinking.  You have twitching and shaking.  You throw up blood. It may look bright red or like coffee grounds.  You notice blood in your poop (bowel movements).  You become lightheaded or pass out (faint). MAKE SURE YOU:   Understand these instructions.  Will watch your condition.  Will get help right away if you are not doing well or get worse.   This information is not intended to replace advice given to you by your health care provider. Make sure you discuss any questions you have with your health care provider.   Document Released: 04/03/2008 Document Revised: 06/18/2013 Document Reviewed: 03/21/2013 Elsevier Interactive Patient Education 2016 [employer].  Alcohol Use Disorder Alcohol use disorder is a mental disorder. It is not a one-time incident of heavy drinking. Alcohol use disorder is the excessive and uncontrollable use of alcohol over time that leads to problems with functioning in one or more areas of daily living. People with this disorder  risk harming themselves and others when they drink to excess. Alcohol use disorder also can cause other mental disorders, such as mood and anxiety disorders, and serious physical problems. People with alcohol use disorder often misuse other drugs.  Alcohol use disorder is common and widespread. Some people with this disorder drink alcohol to cope with or escape from negative life events. Others drink to relieve chronic pain or symptoms of mental illness. People with a family history of alcohol use disorder are at higher risk of losing control and using alcohol to excess.  Drinking too much alcohol can cause injury, accidents, and health problems. One drink can be too much when you are:  Working.  Pregnant or breastfeeding.  Taking medicines. Ask your doctor.  Driving or planning to drive. SYMPTOMS  Signs and symptoms of alcohol use disorder may include the following:   Consumption ofalcohol inlarger amounts or over a longer period of time than intended.  Multiple unsuccessful attempts to cutdown or control alcohol use.   A great deal of time spent obtaining alcohol, using alcohol, or recovering from the effects of alcohol (hangover).  A strong desire or urge to use alcohol (cravings).   Continued use of alcohol despite problems at work, school, or home because of alcohol use.   Continued use of alcohol despite problems in relationships because of alcohol use.  Continued use of alcohol  in situations when it is physically hazardous, such as driving a car.  Continued use of alcohol despite awareness of a physical or psychological problem that is likely related to alcohol use. Physical problems related to alcohol use can involve the brain, heart, liver, stomach, and intestines. Psychological problems related to alcohol use include intoxication, depression, anxiety, psychosis, delirium, and dementia.   The need for increased amounts of alcohol to achieve the same desired effect, or a  decreased effect from the consumption of the same amount of alcohol (tolerance).  Withdrawal symptoms upon reducing or stopping alcohol use, or alcohol use to reduce or avoid withdrawal symptoms. Withdrawal symptoms include:  Racing heart.  Hand tremor.  Difficulty sleeping.  Nausea.  Vomiting.  Hallucinations.  Restlessness.  Seizures. DIAGNOSIS Alcohol use disorder is diagnosed through an assessment by your health care provider. Your health care provider may start by asking three or four questions to screen for excessive or problematic alcohol use. To confirm a diagnosis of alcohol use disorder, at least two symptoms must be present within a 47-month period. The severity of alcohol use disorder depends on the number of symptoms:  Mild--two or three.  Moderate--four or five.  Severe--six or more. Your health care provider may perform a physical exam or use results from lab tests to see if you have physical problems resulting from alcohol use. Your health care provider may refer you to a mental health professional for evaluation. TREATMENT  Some people with alcohol use disorder are able to reduce their alcohol use to low-risk levels. Some people with alcohol use disorder need to quit drinking alcohol. When necessary, mental health professionals with specialized training in substance use treatment can help. Your health care provider can help you decide how severe your alcohol use disorder is and what type of treatment you need. The following forms of treatment are available:   Detoxification. Detoxification involves the use of prescription medicines to prevent alcohol withdrawal symptoms in the first week after quitting. This is important for people with a history of symptoms of withdrawal and for heavy drinkers who are likely to have withdrawal symptoms. Alcohol withdrawal can be dangerous and, in severe cases, cause death. Detoxification is usually provided in a hospital or in-patient  substance use treatment facility.  Counseling or talk therapy. Talk therapy is provided by substance use treatment counselors. It addresses the reasons people use alcohol and ways to keep them from drinking again. The goals of talk therapy are to help people with alcohol use disorder find healthy activities and ways to cope with life stress, to identify and avoid triggers for alcohol use, and to handle cravings, which can cause relapse.  Medicines.Different medicines can help treat alcohol use disorder through the following actions:  Decrease alcohol cravings.  Decrease the positive reward response felt from alcohol use.  Produce an uncomfortable physical reaction when alcohol is used (aversion therapy).  Support groups. Support groups are run by people who have quit drinking. They provide emotional support, advice, and guidance. These forms of treatment are often combined. Some people with alcohol use disorder benefit from intensive combination treatment provided by specialized substance use treatment centers. Both inpatient and outpatient treatment programs are available.   This information is not intended to replace advice given to you by your health care provider. Make sure you discuss any questions you have with your health care provider.   Document Released: 11/23/2004 Document Revised: 11/06/2014 Document Reviewed: 01/23/2013 Elsevier Interactive Patient Education 2016 [employer] Hyponatremia Hyponatremia  is when the amount of salt (sodium) in your blood is too low. When sodium levels are low, your cells absorb extra water and they swell. The swelling happens throughout the body, but it mostly affects the brain. CAUSES This condition may be caused by:  Heart, kidney, or liver problems.  Thyroid problems.  Adrenal gland problems.  Metabolic conditions, such as syndrome of inappropriate antidiuretic hormone (SIADH).  Severe vomiting and diarrhea.  Certain medicines or  illegal drugs.  Dehydration.  Drinking too much water.  Eating a diet that is low in sodium.  Large burns on your body.  Sweating. RISK FACTORS This condition is more likely to develop in people who:  Have long-term (chronic) kidney disease.  Have heart failure.  Have a medical condition that causes frequent or excessive diarrhea.  Have metabolic conditions, such as Addison disease or SIADH.  Take certain medicines that affect the sodium and fluid balance in the blood. Some of these medicine types include:  Diuretics.  NSAIDs.  Some opioid pain medicines.  Some antidepressants.  Some seizure prevention medicines. SYMPTOMS  Symptoms of this condition include:  Nausea and vomiting.  Confusion.  Lethargy.  Agitation.  Headache.  Seizures.  Unconsciousness.  Appetite loss.  Muscle weakness and cramping.  Feeling weak or light-headed.  Having a rapid heart rate.  Fainting, in severe cases. DIAGNOSIS This condition is diagnosed with a medical history and physical exam. You will also have other tests, including:  Blood tests.  Urine tests. TREATMENT Treatment for this condition depends on the cause. Treatment may include:  Fluids given through an IV tube that is inserted into one of your veins.  Medicines to correct the sodium imbalance. If medicines are causing the condition, the medicines will need to be adjusted.  Limiting water or fluid intake to get the correct sodium balance. HOME CARE INSTRUCTIONS  Take medicines only as directed by your health care provider. Many medicines can make this condition worse. Talk with your health care provider about any medicines that you are currently taking.  Carefully follow a recommended diet as directed by your health care provider.  Carefully follow instructions from your health care provider about fluid restrictions.  Keep all follow-up visits as directed by your health care provider. This is  important.  Do not drink alcohol. SEEK MEDICAL CARE IF:  You develop worsening nausea, fatigue, headache, confusion, or weakness.  Your symptoms go away and then return.  You have problems following the recommended diet. SEEK IMMEDIATE MEDICAL CARE IF:  You have a seizure.  You faint.  You have ongoing diarrhea or vomiting.   This information is not intended to replace advice given to you by your health care provider. Make sure you discuss any questions you have with your health care provider.   Document Released: 10/06/2002 Document Revised: 03/02/2015 Document Reviewed: 11/05/2014 Elsevier Interactive Patient Education [employer].

## 2016-07-15 NOTE — Progress Notes (Addendum)
LCSWA spoke with patient sister Delma Freeze about patient disposition to [facility]. She agreed patient will transport by [facility]. LCSWA provided contact information for facility.  D/C Summary Faxed to SNF.Updated D/C Sum. Faxed.  Facility aware of Discharge. Nurse Given Report.  [facility] Called for Transport

## 2016-07-15 NOTE — Clinical Social Work Placement (Signed)
   CLINICAL SOCIAL WORK PLACEMENT  NOTE  Date:  07/15/2016  Patient Details  Name: [name] MRN: [id number] Date of Birth: [birth year]  Clinical Social Work is seeking post-discharge placement for this patient at the [place] level of care (*CSW will initial, date and re-position this form in  chart as items are completed):  Yes   Patient/family provided with [facility] Work Department's list of facilities offering this level of care within the geographic area requested by the patient (or if unable, by the patient's family).  Yes   Patient/family informed of their freedom to choose among providers that offer the needed level of care, that participate in Medicare, Medicaid or managed care program needed by the patient, have an available bed and are willing to accept the patient.  Yes   Patient/family informed of [facility]'s ownership interest in [facility] and [facility], as well as of the fact that they are under no obligation to receive care at these facilities.  PASRR submitted to EDS on       PASRR number received on       Existing PASRR number confirmed on 07/15/16     FL2 transmitted to all facilities in geographic area requested by pt/family on       FL2 transmitted to all facilities within larger geographic area on 07/15/16     Patient informed that his/her managed care company has contracts with or will negotiate with certain facilities, including the following:        Yes   Patient/family informed of bed offers received.  Patient chooses bed at [facility]     Physician recommends and patient chooses bed at      Patient to be transferred to [facility] on 07/15/16.  Patient to be transferred to facility by [facility]     Patient family notified on 07/15/16 of transfer.  Name of family member notified:  [name] [phone]     PHYSICIAN Please prepare priority discharge summary, including  medications, Please prepare prescriptions     Additional Comment:    _______________________________________________ Lia Hopping, LCSW 07/15/2016, 9:17 AM

## 2016-07-15 NOTE — Discharge Summary (Addendum)
Physician Discharge Summary  [name] [id number] DOB: 1941-05-23 DOA: 07/09/2016  PCP: Odis Luster, MD  Admit date: 07/09/2016 Discharge date: 07/15/2016  Recommendations for Outpatient Follow-up:  Continue sodium tablet for 3 more days on discharge, recheck sodium level in 3 days from discharge. If sodium level less than 129 then please continue at least another few days of sodium tablets. Pt sodium level is 129. Please have palliative care services evaluate the patient is SNF. Recheck dilantin level in 1 week from discharge   Discharge Diagnoses:  Principal Problem:   Hyponatremia Active Problems:   Rib fracture   Alcohol intoxication ([employer])   Fall    Discharge Condition: stable   Diet recommendation: Dysphagia 1 diet   History of present illness:  75 y.o.malewith past medical history of alcoholism and alcohol-related seizures who presented to [facility] long hospital intoxicated. He apparently fell which brought him to ED. CT head and neck done on admission was unremarkable. Chest x-ray showed fractures of the 10th, 11th and 12th ribs. His blood work was notable for hyponatremia. Alcohol level was 292.  Hospital Course:   Assessment & Plan:  Fall / Ribfractures - Appreciate PT evaluation: Recommendation for skilled nursing facility placement - Seen by trauma surgery; continue supportive care per their recommendation   Acute alcohol intoxication / Acute metabolic encephalopathy  - Altered mental status likely secondary to acute alcohol intoxication - Continue thiamine, folic acid and multivitamin  Hyponatremia, hypo-osmolar  - Likely related to history of alcohol abuse - His baseline is 129 - Patient was on IV fluids since the admission and his sodium level gradually declined to 122  - His urine osmolarity is 206 plasma osmolarity is 250, urine sodium is 30. His TSH is within normal limits, there is no abnormal masses seen on CT scan which essentially  would exclude SIADH. He is not drinking too much if anything his by mouth intake is relatively diminished. Considering urine sodium of 30 this would essentially exclude dehydration and CHF as urine sodium is typically less than 20 in those particular cases - We started sodium tablets 9/15 and his sodium level is 127 this am - He can continue sodium tablets for 3 more days on discharge   Leukopenia - Likely bone marrow suppression from alcohol abuse - White blood cell count subsequently normalized  Severe protein calorie malnutrition - Continue nutritional supplementation - SLP evaluation - dysphagia 1 diet   Seizure disorder - Continue dilantin 100 mg TID - Recheck dilantin in 1 week from discharge   DVT prophylaxis: SCD's bilaterally  Code Status: full code  Family Communication: No family at the bedside    Consultants:   Surgery  SLP  PT  Procedures:   None  Antimicrobials:   None    Signed:  Leisa Lenz, MD  [facility] 07/15/2016, 8:53 AM  Pager #: [phone]  Time spent in minutes: less than 30 minutes   Discharge Exam: Vitals:   07/14/16 2243 07/15/16 0403  BP: (!) 98/59 (!) 100/51  Pulse: 79 72  Resp: 18 18  Temp: 99.5 F (37.5 C) 98.9 F (37.2 C)   Vitals:   07/14/16 0558 07/14/16 1439 07/14/16 2243 07/15/16 0403  BP: 132/65 (!) 142/66 (!) 98/59 (!) 100/51  Pulse: 65 68 79 72  Resp: 16 18 18 18   Temp: 98.1 F (36.7 C) 98.4 F (36.9 C) 99.5 F (37.5 C) 98.9 F (37.2 C)  TempSrc: Oral Oral Oral Oral  SpO2: 99% 100% 98% 100%  Weight:  Height:        General: Pt is alert, not in acute distress Cardiovascular: Regular rate and rhythm, S1/S2 + Respiratory:  no wheezing, no crackles, no rhonchi Abdominal: Soft, non tender, non distended, bowel sounds +, no guarding Extremities: no edema, no cyanosis, pulses palpable bilaterally DP and PT Neuro: Grossly nonfocal  Discharge Instructions  Discharge Instructions     Call MD for:  difficulty breathing, headache or visual disturbances    Complete by:  As directed    Call MD for:  hives    Complete by:  As directed    Call MD for:  persistant nausea and vomiting    Complete by:  As directed    Call MD for:  severe uncontrolled pain    Complete by:  As directed    Diet - low sodium heart healthy    Complete by:  As directed    Discharge instructions    Complete by:  As directed    Continue sodium tablet for 3 more days on discharge, recheck sodium level in 3 days from discharge. If sodium level less than 129 then please continue at least another few days of sodium tablets. Pt sodium level is 129. Please have palliative care services evaluate the patient is SNF.   Increase activity slowly    Complete by:  As directed        Medication List    STOP taking these medications   oxyCODONE-acetaminophen 5-325 MG tablet Commonly known as:  PERCOCET/ROXICET   phenytoin 200 MG ER capsule Commonly known as:  DILANTIN     TAKE these medications   feeding supplement (ENSURE ENLIVE) Liqd Take 237 mLs by mouth 2 (two) times daily between meals.   folic acid 1 MG tablet Commonly known as:  FOLVITE Take 1 tablet (1 mg total) by mouth daily.   multivitamin Liqd Take 15 mLs by mouth daily.   ondansetron 4 MG tablet Commonly known as:  ZOFRAN Take 1 tablet (4 mg total) by mouth every 6 (six) hours as needed for nausea.   phenytoin 100 MG/4ML suspension Commonly known as:  DILANTIN Take 4 mLs (100 mg total) by mouth 3 (three) times daily.   sodium chloride 1 g tablet Take 1 tablet (1 g total) by mouth 2 (two) times daily with a meal.   thiamine 100 MG tablet Take 1 tablet (100 mg total) by mouth daily.       Follow-up Information    KINGSLEY,THOMAS, MD. Schedule an appointment as soon as possible for a visit in 2 week(s).   Specialty:  Family Medicine Contact information: [address] [place] [address] [phone]             The results of significant diagnostics from this hospitalization (including imaging, microbiology, ancillary and laboratory) are listed below for reference.    Significant Diagnostic Studies: Dg Ribs Unilateral W/chest Left  Result Date: 07/09/2016 CLINICAL DATA:  Pain.  Fall at home. EXAM: LEFT RIBS AND CHEST - 3+ VIEW COMPARISON:  Chest radiograph 08/18/2008 FINDINGS: Acute minimally displaced fractures posterior lateral left twelfth rib and anterior lateral tenth rib. Nondisplaced fracture of anterior lateral eleventh rib is likely acute. Remote nondisplaced fractures of the upper anterior lateral ribs are stable from prior chest radiograph. No pneumothorax. Emphysematous change. Ill-defined interstitial opacity at the lung bases suspicious for fibrosis. No large pleural effusion. Heart size is normal. Mild tortuosity of the thoracic aorta. IMPRESSION: 1. Acute fractures of anterior lateral  tenth, eleventh, and twelfth ribs, tenth and twelfth rib fractures are minimally displaced. No pneumothorax. 2. Emphysema. Interstitial prominence at the lung bases, left greater than right, may be fibrosis. Electronically Signed   By: Jeb Levering M.D.   On: 07/09/2016 22:41   Ct Head Wo Contrast  Result Date: 07/09/2016 CLINICAL DATA:  Intoxication and fall at home.  Initial encounter. EXAM: CT HEAD WITHOUT CONTRAST CT CERVICAL SPINE WITHOUT CONTRAST TECHNIQUE: Multidetector CT imaging of the head and cervical spine was performed following the standard protocol without intravenous contrast. Multiplanar CT image reconstructions of the cervical spine were also generated. COMPARISON:  07/14/2015 head CT FINDINGS: CT HEAD FINDINGS Brain: No evidence of acute infarction, hemorrhage, hydrocephalus, extra-axial collection or mass lesion/mass effect. Advanced cortical atrophy with parietal predilection. Vascular: Atherosclerotic calcification. Skull: Negative for fracture or focal lesion. Sinuses/Orbits:  Remote blowout fracture of the medial wall right orbit. Other: None CT CERVICAL SPINE FINDINGS Alignment: No traumatic malalignment Skull base and vertebrae: No evidence of acute fracture. Soft tissues and spinal canal: No prevertebral fluid or swelling. No visible canal hematoma. Disc levels: Diffuse endplate irregularity and disc calcification. Upper cervical facet arthropathy with erosive features at C2-3. Upper chest: Fibrotic and emphysematous changes in the apical lungs. IMPRESSION: 1. No evidence of acute intracranial or cervical spine injury. 2. Advanced cortical atrophy with parietal predilection. 3.  Emphysema. [id number]) Electronically Signed   By: Monte Fantasia M.D.   On: 07/09/2016 20:57   Ct Chest W Contrast  Result Date: 07/10/2016 CLINICAL DATA:  Fall yesterday with shortness of breath. Left-sided rib fractures on radiographs. EXAM: CT CHEST, ABDOMEN, AND PELVIS WITH CONTRAST TECHNIQUE: Multidetector CT imaging of the chest, abdomen and pelvis was performed following the standard protocol during bolus administration of intravenous contrast. CONTRAST:  185mL ISOVUE-300 IOPAMIDOL (ISOVUE-300) INJECTION 61% COMPARISON:  Chest and pelvic radiographs same date. FINDINGS: CT CHEST FINDINGS Cardiovascular: No evidence of mediastinal hematoma or acute vascular injury. The pulmonary arteries appear unremarkable. Mild atherosclerosis of the aorta, great vessels and coronary arteries. The heart size is normal. There is no pericardial effusion. Mediastinum/Nodes: There are no enlarged mediastinal, hilar or axillary lymph nodes. The thyroid gland, trachea and esophagus demonstrate no significant findings. Lungs/Pleura: Small dependent pleural effusions bilaterally measure water density. There is diffuse chronic lung disease with subpleural reticulation, mild bronchiectasis and scattered honeycomb formation. No confluent airspace opacity, suspicious pulmonary nodule or pneumothorax. Musculoskeletal/Chest  wall: Again demonstrated are acute fractures of the left tenth, eleventh and twelfth ribs. Old left-sided rib fractures are also noted. There is ossification around the coracoclavicular ligament on the right, likely related to remote injury. CT ABDOMEN AND PELVIS FINDINGS Hepatobiliary: No evidence of acute hepatic injury or focal lesion. No evidence of gallstones, gallbladder wall thickening or biliary dilatation. Pancreas: Unremarkable. No pancreatic ductal dilatation or surrounding inflammatory changes. Spleen: Normal in size without focal abnormality. No evidence of splenic injury or surrounding hematoma. Adrenals/Urinary Tract: Both adrenal glands appear normal. There is a right renal parapelvic cysts. The kidneys otherwise appear normal. No evidence of urinary tract calculus or hydronephrosis. No evidence of bladder injury or focal bladder lesion. Stomach/Bowel: No evidence of bowel wall thickening, distention or surrounding inflammatory change. Vascular/Lymphatic: There are no enlarged abdominal or pelvic lymph nodes. No evidence of retroperitoneal hematoma. There is atherosclerosis of the aorta and its branch vessels. No evidence of large vessel occlusion. Reproductive: The prostate gland and seminal vesicles appear unremarkable. Other: No evidence of hemoperitoneum or pneumoperitoneum. Musculoskeletal: No acute  osseous findings are seen. There is lower lumbar spondylosis. There is asymmetric sclerosis of the left femoral head which may indicate avascular necrosis. IMPRESSION: 1. Acute lower left rib fractures as demonstrated radiographically. No evidence of pneumothorax. 2. Small bilateral pleural effusions. 3. 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 4. No acute findings demonstrated within the abdomen or pelvis. 5.  Aortic Atherosclerosis (ICD10-170.0) Electronically Signed   By: Richardean Sale M.D.   On: 07/10/2016 13:18   Ct Cervical Spine Wo Contrast  Result Date:  07/09/2016 CLINICAL DATA:  Intoxication and fall at home.  Initial encounter. EXAM: CT HEAD WITHOUT CONTRAST CT CERVICAL SPINE WITHOUT CONTRAST TECHNIQUE: Multidetector CT imaging of the head and cervical spine was performed following the standard protocol without intravenous contrast. Multiplanar CT image reconstructions of the cervical spine were also generated. COMPARISON:  07/14/2015 head CT FINDINGS: CT HEAD FINDINGS Brain: No evidence of acute infarction, hemorrhage, hydrocephalus, extra-axial collection or mass lesion/mass effect. Advanced cortical atrophy with parietal predilection. Vascular: Atherosclerotic calcification. Skull: Negative for fracture or focal lesion. Sinuses/Orbits: Remote blowout fracture of the medial wall right orbit. Other: None CT CERVICAL SPINE FINDINGS Alignment: No traumatic malalignment Skull base and vertebrae: No evidence of acute fracture. Soft tissues and spinal canal: No prevertebral fluid or swelling. No visible canal hematoma. Disc levels: Diffuse endplate irregularity and disc calcification. Upper cervical facet arthropathy with erosive features at C2-3. Upper chest: Fibrotic and emphysematous changes in the apical lungs. IMPRESSION: 1. No evidence of acute intracranial or cervical spine injury. 2. Advanced cortical atrophy with parietal predilection. 3.  Emphysema. [id number]) Electronically Signed   By: Monte Fantasia M.D.   On: 07/09/2016 20:57   Ct Abdomen Pelvis W Contrast  Result Date: 07/10/2016 CLINICAL DATA:  Fall yesterday with shortness of breath. Left-sided rib fractures on radiographs. EXAM: CT CHEST, ABDOMEN, AND PELVIS WITH CONTRAST TECHNIQUE: Multidetector CT imaging of the chest, abdomen and pelvis was performed following the standard protocol during bolus administration of intravenous contrast. CONTRAST:  132mL ISOVUE-300 IOPAMIDOL (ISOVUE-300) INJECTION 61% COMPARISON:  Chest and pelvic radiographs same date. FINDINGS: CT CHEST FINDINGS  Cardiovascular: No evidence of mediastinal hematoma or acute vascular injury. The pulmonary arteries appear unremarkable. Mild atherosclerosis of the aorta, great vessels and coronary arteries. The heart size is normal. There is no pericardial effusion. Mediastinum/Nodes: There are no enlarged mediastinal, hilar or axillary lymph nodes. The thyroid gland, trachea and esophagus demonstrate no significant findings. Lungs/Pleura: Small dependent pleural effusions bilaterally measure water density. There is diffuse chronic lung disease with subpleural reticulation, mild bronchiectasis and scattered honeycomb formation. No confluent airspace opacity, suspicious pulmonary nodule or pneumothorax. Musculoskeletal/Chest wall: Again demonstrated are acute fractures of the left tenth, eleventh and twelfth ribs. Old left-sided rib fractures are also noted. There is ossification around the coracoclavicular ligament on the right, likely related to remote injury. CT ABDOMEN AND PELVIS FINDINGS Hepatobiliary: No evidence of acute hepatic injury or focal lesion. No evidence of gallstones, gallbladder wall thickening or biliary dilatation. Pancreas: Unremarkable. No pancreatic ductal dilatation or surrounding inflammatory changes. Spleen: Normal in size without focal abnormality. No evidence of splenic injury or surrounding hematoma. Adrenals/Urinary Tract: Both adrenal glands appear normal. There is a right renal parapelvic cysts. The kidneys otherwise appear normal. No evidence of urinary tract calculus or hydronephrosis. No evidence of bladder injury or focal bladder lesion. Stomach/Bowel: No evidence of bowel wall thickening, distention or surrounding inflammatory change. Vascular/Lymphatic: There are no enlarged abdominal or pelvic lymph nodes. No evidence  of retroperitoneal hematoma. There is atherosclerosis of the aorta and its branch vessels. No evidence of large vessel occlusion. Reproductive: The prostate gland and seminal  vesicles appear unremarkable. Other: No evidence of hemoperitoneum or pneumoperitoneum. Musculoskeletal: No acute osseous findings are seen. There is lower lumbar spondylosis. There is asymmetric sclerosis of the left femoral head which may indicate avascular necrosis. IMPRESSION: 1. Acute lower left rib fractures as demonstrated radiographically. No evidence of pneumothorax. 2. Small bilateral pleural effusions. 3. 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 4. No acute findings demonstrated within the abdomen or pelvis. 5.  Aortic Atherosclerosis (ICD10-170.0) Electronically Signed   By: Richardean Sale M.D.   On: 07/10/2016 13:18   Dg Pelvis Portable  Result Date: 07/10/2016 CLINICAL DATA:  Bilateral hip pain, left greater than right. No recent injury. EXAM: PORTABLE PELVIS 1-2 VIEWS COMPARISON:  None. FINDINGS: 0850 hours. The bones appear mildly demineralized. There is no evidence of acute fracture, dislocation or femoral head avascular necrosis. Mild degenerative changes are present at the hip and sacroiliac joints bilaterally. There is lower lumbar spondylosis with paraspinal osteophytes asymmetric to the right. Iliofemoral atherosclerosis noted. IMPRESSION: No acute osseous findings identified. Degenerative changes as described. Electronically Signed   By: Richardean Sale M.D.   On: 07/10/2016 09:09   Dg Chest Port 1 View  Result Date: 07/10/2016 CLINICAL DATA:  75 year old male with a history of shortness of breath EXAM: PORTABLE CHEST 1 VIEW COMPARISON:  07/09/2016, 08/18/2008 FINDINGS: Cardiomediastinal silhouette unchanged in size and contour. In the last 24 hours there has been increased interlobular septal thickening and interstitial prominence with thickening of the minor fissure. No large pleural effusion. No pneumothorax. Mild airspace disease in the lower lobes. No pneumothorax. Left-sided rib fractures better characterized on prior plain film. Nondisplaced rib  fractures of the lower right ribs. IMPRESSION: Developing pulmonary edema. Left-sided rib fractures better characterized on prior plain film of the chest. There are additional nondisplaced fractures of lower right-sided ribs, which appear to be subacute with healing/ callus formation. Signed, Dulcy Fanny. Earleen Newport, DO [facility] Electronically Signed   By: Corrie Mckusick D.O.   On: 07/10/2016 09:14    Microbiology: No results found for this or any previous visit (from the past 240 hour(s)).   Labs: Basic Metabolic Panel:  Recent Labs Lab 07/11/16 0521 07/12/16 0509 07/13/16 0916 07/14/16 0837 07/15/16 0507  NA 124* 123* 122* 122* 127*  K 4.5 4.2 3.6 3.8 4.1  CL 91* 90* 90* 91* 94*  CO2 22 24 23 23 26   GLUCOSE 122* 136* 131* 97 91  BUN 5* 7 5* 7 10  CREATININE 0.73 0.58* 0.56* 0.57* 0.67  CALCIUM 8.6* 8.7* 8.5* 8.5* 8.7*   Liver Function Tests:  Recent Labs Lab 07/10/16 0454 07/12/16 0509  AST 27 37  ALT 9* 12*  ALKPHOS 64 76  BILITOT 0.4 1.1  PROT 6.3* 7.4  ALBUMIN 3.1* 3.4*   No results for input(s): LIPASE, AMYLASE in the last 168 hours.  Recent Labs Lab 07/10/16 1025  AMMONIA 14   CBC:  Recent Labs Lab 07/09/16 2003 07/10/16 0454 07/11/16 0521  WBC 3.7* 3.5* 8.2  NEUTROABS 2.0 2.0  --   HGB 12.3* 12.8* 13.6  HCT 33.6* 34.1* 35.2*  MCV 83.2 82.4 80.0  PLT 208 171 238   Cardiac Enzymes:  Recent Labs Lab 07/10/16 0454 07/10/16 1025 07/10/16 1701  CKTOTAL 271  --   --   TROPONINI <0.03 <0.03 <  0.03   BNP: BNP (last 3 results) No results for input(s): BNP in the last 8760 hours.  ProBNP (last 3 results) No results for input(s): PROBNP in the last 8760 hours.  CBG: No results for input(s): GLUCAP in the last 168 hours.

## 2019-08-29 ENCOUNTER — Other Ambulatory Visit: Payer: Self-pay

## 2019-08-29 DIAGNOSIS — Z20822 Contact with and (suspected) exposure to covid-19: Secondary | ICD-10-CM

## 2019-08-31 LAB — NOVEL CORONAVIRUS, NAA: SARS-CoV-2, NAA: NOT DETECTED

## 2020-03-04 ENCOUNTER — Emergency Department (HOSPITAL_COMMUNITY): Payer: Medicare Other

## 2020-03-04 ENCOUNTER — Encounter (HOSPITAL_COMMUNITY): Payer: Self-pay | Admitting: Emergency Medicine

## 2020-03-04 ENCOUNTER — Emergency Department (HOSPITAL_COMMUNITY)
Admission: EM | Admit: 2020-03-04 | Discharge: 2020-03-04 | Disposition: A | Discharge status: Home / Self Care | Payer: Medicare Other | Attending: Emergency Medicine | Admitting: Emergency Medicine

## 2020-03-04 DIAGNOSIS — I4891 Unspecified atrial fibrillation: Secondary | ICD-10-CM | POA: Diagnosis not present

## 2020-03-04 DIAGNOSIS — J069 Acute upper respiratory infection, unspecified: Secondary | ICD-10-CM | POA: Insufficient documentation

## 2020-03-04 DIAGNOSIS — R053 Chronic cough: Secondary | ICD-10-CM

## 2020-03-04 DIAGNOSIS — R05 Cough: Secondary | ICD-10-CM | POA: Diagnosis not present

## 2020-03-04 DIAGNOSIS — F17210 Nicotine dependence, cigarettes, uncomplicated: Secondary | ICD-10-CM | POA: Diagnosis not present

## 2020-03-04 DIAGNOSIS — R0602 Shortness of breath: Secondary | ICD-10-CM | POA: Diagnosis present

## 2020-03-04 DIAGNOSIS — R0982 Postnasal drip: Secondary | ICD-10-CM | POA: Diagnosis not present

## 2020-03-04 DIAGNOSIS — Z79899 Other long term (current) drug therapy: Secondary | ICD-10-CM | POA: Insufficient documentation

## 2020-03-04 LAB — CBC
HCT: 39.8 % (ref 39.0–52.0)
Hemoglobin: 13.9 g/dL (ref 13.0–17.0)
MCH: 29 pg (ref 26.0–34.0)
MCHC: 34.9 g/dL (ref 30.0–36.0)
MCV: 82.9 fL (ref 80.0–100.0)
Platelets: 187 10*3/uL (ref 150–400)
RBC: 4.8 MIL/uL (ref 4.22–5.81)
RDW: 13.6 % (ref 11.5–15.5)
WBC: 6.7 10*3/uL (ref 4.0–10.5)
nRBC: 0 % (ref 0.0–0.2)

## 2020-03-04 LAB — BASIC METABOLIC PANEL
Anion gap: 9 (ref 5–15)
BUN: 13 mg/dL (ref 8–23)
CO2: 25 mmol/L (ref 22–32)
Calcium: 9.1 mg/dL (ref 8.9–10.3)
Chloride: 106 mmol/L (ref 98–111)
Creatinine, Ser: 1.14 mg/dL (ref 0.61–1.24)
GFR calc Af Amer: >60 mL/min (ref >60)
GFR calc non Af Amer: >60 mL/min (ref >60)
Glucose, Bld: 107 mg/dL — ABNORMAL HIGH (ref 70–99)
Potassium: 4.2 mmol/L (ref 3.5–5.1)
Sodium: 140 mmol/L (ref 135–145)

## 2020-03-04 MED ORDER — BENZONATATE 100 MG PO CAPS
100.0000 mg | ORAL_CAPSULE | Freq: Once | ORAL | Status: AC
  Administered 2020-03-04: 100 mg via ORAL
  Filled 2020-03-04: qty 1

## 2020-03-04 MED ORDER — AZITHROMYCIN 250 MG PO TABS: 250.0000 mg | ORAL_TABLET | Freq: Every day | ORAL | 0 refills | Status: DC

## 2020-03-04 MED ORDER — ALBUTEROL SULFATE HFA 108 (90 BASE) MCG/ACT IN AERS
2.0000 | INHALATION_SPRAY | Freq: Once | RESPIRATORY_TRACT | Status: AC
  Administered 2020-03-04: 16:00:00 2 via RESPIRATORY_TRACT
  Filled 2020-03-04: qty 6.7

## 2020-03-04 MED ORDER — BENZONATATE 100 MG PO CAPS: 100.0000 mg | ORAL_CAPSULE | Freq: Three times a day (TID) | ORAL | 0 refills | Status: DC

## 2020-03-04 MED ORDER — DEXAMETHASONE 4 MG PO TABS
10.0000 mg | ORAL_TABLET | Freq: Once | ORAL | Status: AC
  Administered 2020-03-04: 10 mg via ORAL
  Filled 2020-03-04: qty 3

## 2020-03-04 NOTE — Care Management (Signed)
ED CM met with patient with at bedside to discuss transitional care planning and care coordination. Patient reports he has seen Dr. Kennon Holter in the past but has not seen anyone since the passing of Dr. Kennon Holter.  Discussed [facility] for primary care patient is agreeable, EDP also placed referral to A-Fib clinic. Patient has requested that I contact his sister [name] [place] with the follow up information because he no longer drives. CM will contact her with this information.

## 2020-03-04 NOTE — ED Triage Notes (Signed)
pts daughter helps provide history.  She states that pt is c/o prod cough, sob and ear pain since last week. She also reports pt has had a decrease in appetite. Saw his pcp last week and states they gave him a breathing treatment and inhaler. Denies fevers.

## 2020-03-04 NOTE — Discharge Instructions (Signed)
Your cough could be due to seasonal allergies.  Please try one of the over-the-counter medicines such as Claritin Zyrtec Allegra Xyzal.  Pick one that is on sale and try it.  You can also try Zantac or Pepcid which are also over-the-counter which could help you in case this was due to reflux.  I am prescribing you a antibiotic which covers for pertussis which can sometimes cause adults to have a chronic cough.  Please follow-up with the family doctor.  You were also found to have atrial fibrillation.  This is something that could cause you to have a stroke.  In our discussion today he did not want to start a blood thinning medicine.  Please discuss this with the cardiologist in the office.

## 2020-03-04 NOTE — ED Provider Notes (Signed)
[facility] EMERGENCY DEPARTMENT Provider Note   CSN: [id number] Arrival date & time: 03/04/20  1132     History Chief Complaint  Patient presents with  . Cough  . Anorexia    [name] is a 79 y.o. male.  79 yo M with a chief complaints of cough and shortness of breath.  Ongoing for the past couple months now.  States is been getting progressively worse.  He denies fevers.  Has had some decreased oral intake.  Denies abdominal pain denies diarrhea.  Denies chest pain.  The history is provided by the patient.  Cough Associated symptoms: shortness of breath   Associated symptoms: no chest pain, no chills, no eye discharge, no fever, no headaches, no myalgias and no rash   Illness Severity:  Moderate Onset quality:  Gradual Duration:  2 months Timing:  Constant Progression:  Worsening Chronicity:  New Associated symptoms: congestion, cough and shortness of breath   Associated symptoms: no abdominal pain, no chest pain, no diarrhea, no fever, no headaches, no myalgias, no rash and no vomiting        Past Medical History:  Diagnosis Date  . Alcohol abuse   . Seizures [facility])     Patient Active Problem List   Diagnosis Date Noted  . Rib fracture 07/10/2016  . Alcohol intoxication ([place]) 07/10/2016  . Fall 07/10/2016  . Hyponatremia 07/09/2016    History reviewed. No pertinent surgical history.     Family History  Problem Relation Age of Onset  . Hypertension Other     Social History   Tobacco Use  . Smoking status: Current Every Day Smoker  . Smokeless tobacco: Never Used  Substance Use Topics  . Alcohol use: Yes    Alcohol/week: 6.0 standard drinks    Types: 6 Cans of beer per week  . Drug use: No    Home Medications Prior to Admission medications   Medication Sig Start Date End Date Taking? Authorizing Provider  azithromycin (ZITHROMAX) 250 MG tablet Take 1 tablet (250 mg total) by mouth daily. Take first 2 tablets together,  then 1 every day until finished. 03/04/20   Deno Etienne, DO  benzonatate (TESSALON) 100 MG capsule Take 1 capsule (100 mg total) by mouth every 8 (eight) hours. 03/04/20   Deno Etienne, DO  feeding supplement, ENSURE ENLIVE, (ENSURE ENLIVE) LIQD Take 237 mLs by mouth 2 (two) times daily between meals. 07/15/16   Robbie Lis, MD  folic acid (FOLVITE) 1 MG tablet Take 1 tablet (1 mg total) by mouth daily. 07/15/16   Robbie Lis, MD  Multiple Vitamin (MULTIVITAMIN) LIQD Take 15 mLs by mouth daily. 07/15/16   Robbie Lis, MD  ondansetron (ZOFRAN) 4 MG tablet Take 1 tablet (4 mg total) by mouth every 6 (six) hours as needed for nausea. 07/15/16   Robbie Lis, MD  phenytoin (DILANTIN) 100 MG/4ML suspension Take 4 mLs (100 mg total) by mouth 3 (three) times daily. 07/15/16   Robbie Lis, MD  sodium chloride 1 g tablet Take 1 tablet (1 g total) by mouth 2 (two) times daily with a meal. 07/15/16   Robbie Lis, MD  thiamine 100 MG tablet Take 1 tablet (100 mg total) by mouth daily. 07/15/16   Robbie Lis, MD    Allergies    Patient has no known allergies.  Review of Systems   Review of Systems  Constitutional: Negative for chills and fever.  HENT: Positive for congestion.  Negative for facial swelling.   Eyes: Negative for discharge and visual disturbance.  Respiratory: Positive for cough and shortness of breath. Negative for choking.   Cardiovascular: Negative for chest pain and palpitations.  Gastrointestinal: Negative for abdominal pain, diarrhea and vomiting.  Musculoskeletal: Negative for arthralgias and myalgias.  Skin: Negative for color change and rash.  Neurological: Negative for tremors, syncope and headaches.  Psychiatric/Behavioral: Negative for confusion and dysphoric mood.    Physical Exam Updated Vital Signs BP 113/78 (BP Location: Right Arm)   Pulse 94   Temp 98.4 F (36.9 C)   Resp 16   SpO2 98%   Physical Exam Vitals and nursing note reviewed.  Constitutional:       Appearance: He is well-developed.  HENT:     Head: Normocephalic and atraumatic.     Comments: Swollen turbinates, posterior nasal drip, no noted sinus ttp, tm normal bilaterally.   Eyes:     Pupils: Pupils are equal, round, and reactive to light.  Neck:     Vascular: No JVD.  Cardiovascular:     Rate and Rhythm: Normal rate and regular rhythm.     Heart sounds: No murmur. No friction rub. No gallop.   Pulmonary:     Effort: No respiratory distress.     Breath sounds: No wheezing.  Abdominal:     General: There is no distension.     Tenderness: There is no abdominal tenderness. There is no guarding or rebound.  Musculoskeletal:        General: Normal range of motion.     Cervical back: Normal range of motion and neck supple.  Skin:    Coloration: Skin is not pale.     Findings: No rash.  Neurological:     Mental Status: He is alert and oriented to person, place, and time.  Psychiatric:        Behavior: Behavior normal.     ED Results / Procedures / Treatments   Labs (all labs ordered are listed, but only abnormal results are displayed) Labs Reviewed  BASIC METABOLIC PANEL - Abnormal; Notable for the following components:      Result Value   Glucose, Bld 107 (*)    All other components within normal limits  CBC    EKG EKG Interpretation  Date/Time:  Thursday Mar 04 2020 11:47:47 EDT Ventricular Rate:  90 PR Interval:    QRS Duration: 70 QT Interval:  346 QTC Calculation: 423 R Axis:   30 Text Interpretation: Atrial fibrillation Possible Anterior infarct , age undetermined Abnormal ECG Otherwise no significant change Confirmed by Deno Etienne [phone]) on 03/04/2020 3:32:22 PM   Radiology DG Chest 2 View  Result Date: 03/04/2020 CLINICAL DATA:  Cough and shortness of breath EXAM: CHEST - 2 VIEW COMPARISON:  July 10, 2016 FINDINGS: There is 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 No frank edema or airspace  opacity evident. Heart size and pulmonary vascularity are normal. No adenopathy. Evidence of old trauma in the lateral right clavicle region, stable. IMPRESSION: Fibrotic change bilaterally, essentially stable. No edema or airspace opacity. Cardiac silhouette normal. No adenopathy evident. Electronically Signed   By: Lowella Grip III M.D.   On: 03/04/2020 12:25    Procedures Procedures (including critical care time)  Medications Ordered in ED Medications  albuterol (VENTOLIN HFA) 108 (90 Base) MCG/ACT inhaler 2 puff (2 puffs Inhalation Given 03/04/20 1601)  benzonatate (TESSALON) capsule 100 mg (100 mg  Oral Given 03/04/20 1601)  dexamethasone (DECADRON) tablet 10 mg (10 mg Oral Given 03/04/20 1641)    ED Course  I have reviewed the triage vital signs and the nursing notes.  Pertinent labs & imaging results that were available during my care of the patient were reviewed by me and considered in my medical decision making (see chart for details).    MDM Rules/Calculators/A&P                      79 yo M with a chief complaints of cough and congestion.  Going on for a couple months now.  No tachypnea not requiring oxygen.  Chest x-ray viewed by me without focal infiltrate or pneumothorax.  Physical lab work with no concerning finding.  No leukocytosis no significant electrolyte abnormality.  I think most likely the patient is coughing due to posterior nasal drip.  This time of the year the most likely diagnosis would be allergic rhinitis.  We will give trial of albuterol and see if that improves and symptomatically here.  If so we will give burst of steroids.  With his prolonged cough also give a Z-Pak to treat for possible pertussis.  PCP follow-up.  I asked the patient about follow-up and he states he does not have a family doctor.  In that case I will refer him to the A. fib clinic as well as have social work see him to try and establish a PCP for him.  I did discuss with the patient that with  atrial fibrillation he had an increased risk of stroke and that this could be limited by use of blood thinning medicines.  He is currently declining.  Like to talk to a cardiologist first.   CHA2DS2/VAS Stroke Risk Points      N/A >= 2 Points: High Risk  1 - 1.99 Points: Medium Risk  0 Points: Low Risk    A final score could not be computed because of missing components.: Last  Change: N/A     This score determines the patient's risk of having a stroke if the  patient has atrial fibrillation.      This score is not applicable to this patient. Components are not  calculated.    5:42 PM:  I have discussed the diagnosis/risks/treatment options with the patient and believe the pt to be eligible for discharge home to follow-up with PCP, cards. We also discussed returning to the ED immediately if new or worsening sx occur. We discussed the sx which are most concerning (e.g., sudden worsening pain, fever, inability to tolerate by mouth) that necessitate immediate return. Medications administered to the patient during their visit and any new prescriptions provided to the patient are listed below.  Medications given during this visit Medications  albuterol (VENTOLIN HFA) 108 (90 Base) MCG/ACT inhaler 2 puff (2 puffs Inhalation Given 03/04/20 1601)  benzonatate (TESSALON) capsule 100 mg (100 mg Oral Given 03/04/20 1601)  dexamethasone (DECADRON) tablet 10 mg (10 mg Oral Given 03/04/20 1641)     The patient appears reasonably screen and/or stabilized for discharge and I doubt any other medical condition or other [facility] requiring further screening, evaluation, or treatment in the ED at this time prior to discharge.   Final Clinical Impression(s) / ED Diagnoses Final diagnoses:  Viral upper respiratory tract infection  Chronic cough  Atrial fibrillation, unspecified type ([place])    Rx / DC Orders ED Discharge Orders         Ordered  azithromycin (ZITHROMAX) 250 MG tablet  Daily     03/04/20 1625     benzonatate (TESSALON) 100 MG capsule  Every 8 hours     03/04/20 1625           Deno Etienne, DO 03/04/20 1742

## 2020-03-05 ENCOUNTER — Telehealth (HOSPITAL_COMMUNITY): Payer: Self-pay

## 2020-03-05 ENCOUNTER — Telehealth: Payer: Self-pay | Admitting: Surgery

## 2020-03-05 NOTE — Telephone Encounter (Signed)
Reached out to patient to schedule ED follow-up. Called both numbers. One number disconnected and other number does not have an voicemail box set up at this time.

## 2020-03-05 NOTE — Telephone Encounter (Signed)
ED CM called and spoke with patient's sister Delma Freeze and updated her on patient appt Wed 5/12 at the [facility] Clinic at 2:30pm

## 2020-03-09 ENCOUNTER — Telehealth: Payer: Self-pay

## 2020-03-09 NOTE — Telephone Encounter (Signed)
Message received from Laurena Slimmer, RN CM requesting follow up appointment for patient at [facility]. Call placed to patient and his sister, [name], answered. The patient was not with her at the time of the call.  Explained to her about the need to establish care with PCP. She was in agreement to scheduling appt at [facility].  ED follow up scheduled for 03/18/2020 @ 0830.   Reminded her that he has an appt with cardiology tomorrow - she then found the information on his AVS.  She also noted that she is interested in having him placed in a facility. Explained to her that he needs to be in agreement and it can be discussed at his appointment next week.confirmed that she has the phone number for [facility] if questions arise prior to his appointment

## 2020-03-10 ENCOUNTER — Other Ambulatory Visit: Payer: Self-pay

## 2020-03-10 ENCOUNTER — Ambulatory Visit (HOSPITAL_COMMUNITY)
Admission: RE | Admit: 2020-03-10 | Discharge: 2020-03-10 | Disposition: A | Discharge status: Home / Self Care | Payer: Medicare Other | Source: Ambulatory Visit | Attending: Physician Assistant | Admitting: Physician Assistant

## 2020-03-10 ENCOUNTER — Encounter (HOSPITAL_COMMUNITY): Payer: Self-pay | Admitting: Physician Assistant

## 2020-03-10 VITALS — BP 108/60 | HR 83 | Ht 65.0 in | Wt 127.8 lb

## 2020-03-10 DIAGNOSIS — Z79899 Other long term (current) drug therapy: Secondary | ICD-10-CM | POA: Insufficient documentation

## 2020-03-10 DIAGNOSIS — Z7951 Long term (current) use of inhaled steroids: Secondary | ICD-10-CM | POA: Diagnosis not present

## 2020-03-10 DIAGNOSIS — Z87891 Personal history of nicotine dependence: Secondary | ICD-10-CM | POA: Diagnosis not present

## 2020-03-10 DIAGNOSIS — I4891 Unspecified atrial fibrillation: Secondary | ICD-10-CM | POA: Insufficient documentation

## 2020-03-10 DIAGNOSIS — Z7901 Long term (current) use of anticoagulants: Secondary | ICD-10-CM | POA: Insufficient documentation

## 2020-03-10 DIAGNOSIS — G40909 Epilepsy, unspecified, not intractable, without status epilepticus: Secondary | ICD-10-CM | POA: Diagnosis not present

## 2020-03-10 DIAGNOSIS — F1011 Alcohol abuse, in remission: Secondary | ICD-10-CM | POA: Insufficient documentation

## 2020-03-10 DIAGNOSIS — D6869 Other thrombophilia: Secondary | ICD-10-CM | POA: Insufficient documentation

## 2020-03-10 MED ORDER — APIXABAN 2.5 MG PO TABS: ORAL_TABLET | ORAL | 6 refills | Status: DC

## 2020-03-10 NOTE — Patient Instructions (Signed)
Start Eliquis 2.5mg - Take one tablet by mouth twice daily

## 2020-03-10 NOTE — Progress Notes (Signed)
Primary Care Physician: Odis Luster, MD Primary Cardiologist: none Primary Electrophysiologist: none Referring Physician: [facility]   [name] is a 79 y.o. male with a history of alcohol abuse, seizure disorder, and new onset atrial fibrillation who presents for consultation in the [facility]. The patient was initially diagnosed with atrial fibrillation 03/04/20 after presenting to the ED with a persistent cough. He was incidently found to be in rate controlled afib. He was started on a Z-Pak and steroids for presumed URI. Patient has a CHADS2VASC score of 2. He is asymptomatic with his arrhythmia. It is unclear when this began as he has not seen a doctor in years. He has follow up with [facility] for his respiratory issues.   Today, he denies symptoms of palpitations, chest pain, shortness of breath, orthopnea, PND, lower extremity edema, dizziness, presyncope, syncope, snoring, daytime somnolence, bleeding, or neurologic sequela. The patient is tolerating medications without difficulties and is otherwise without complaint today.    Atrial Fibrillation Risk Factors:  he does not have symptoms or diagnosis of sleep apnea. he does have a history of alcohol use.  he has a BMI of Body mass index is 21.27 kg/m.Marland Kitchen Filed Weights   03/10/20 1455  Weight: 58 kg    Family History  Problem Relation Age of Onset  . Hypertension Other      Atrial Fibrillation Management history:  Previous antiarrhythmic drugs: none Previous cardioversions: none Previous ablations: none CHADS2VASC score: 2 Anticoagulation history: none   Past Medical History:  Diagnosis Date  . Alcohol abuse   . Seizures ([place])    No past surgical history on file.  Current Outpatient Medications  Medication Sig Dispense Refill  . albuterol (VENTOLIN HFA) 108 (90 Base) MCG/ACT inhaler     . benzonatate (TESSALON) 100 MG capsule Take 1 capsule (100 mg  total) by mouth every 8 (eight) hours. 21 capsule 0  . SYMBICORT 160-4.5 MCG/ACT inhaler     . apixaban (ELIQUIS) 2.5 MG TABS tablet Take one tablet by mouth twice daily 60 tablet 6   No current facility-administered medications for this encounter.    No Known Allergies  Social History   Socioeconomic History  . Marital status: Divorced    Spouse name: Not on file  . Number of children: Not on file  . Years of education: Not on file  . Highest education level: Not on file  Occupational History  . Not on file  Tobacco Use  . Smoking status: Former Research scientist (life sciences)  . Smokeless tobacco: Never Used  Substance and Sexual Activity  . Alcohol use: Not Currently    Alcohol/week: 6.0 standard drinks    Types: 6 Cans of beer per week  . Drug use: No  . Sexual activity: Not Currently  Other Topics Concern  . Not on file  Social History Narrative  . Not on file   Social Determinants of Health   Financial Resource Strain:   . Difficulty of Paying Living Expenses:   Food Insecurity:   . Worried About Charity fundraiser in the Last Year:   . Arboriculturist in the Last Year:   Transportation Needs:   . Film/video editor (Medical):   Marland Kitchen Lack of Transportation (Non-Medical):   Physical Activity:   . Days of Exercise per Week:   . Minutes of Exercise per Session:   Stress:   . Feeling of Stress :   Social Connections:   .  Frequency of Communication with Friends and Family:   . Frequency of Social Gatherings with Friends and Family:   . Attends Religious Services:   . Active Member of Clubs or Organizations:   . Attends Archivist Meetings:   Marland Kitchen Marital Status:   Intimate Partner Violence:   . Fear of Current or Ex-Partner:   . Emotionally Abused:   Marland Kitchen Physically Abused:   . Sexually Abused:      ROS- All systems are reviewed and negative except as per the HPI above.  Physical Exam: Vitals:   03/10/20 1455  BP: 108/60  Pulse: 83  SpO2: 97%  Weight: 58 kg    Height: 5\' 5"  (1.651 m)    GEN- The patient is well appearing elderly male, alert and oriented x 3 today.   Head- normocephalic, atraumatic Eyes-  Sclera clear, conjunctiva pink Ears- hearing intact Oropharynx- clear Neck- supple  Lungs- mild wheezing, normal work of breathing Heart- irregular rate and rhythm, no murmurs, rubs or gallops  GI- soft, NT, ND, + BS Extremities- no clubbing, cyanosis, or edema MS- no significant deformity or atrophy Skin- no rash or lesion Psych- euthymic mood, full affect Neuro- strength and sensation are intact  Wt Readings from Last 3 Encounters:  03/10/20 58 kg  07/10/16 63.9 kg    EKG today demonstrates afib HR 83, QRS 82, QTc 404  Epic records are reviewed at length today  CHA2DS2-VASc Score = 2  The patient's score is based upon: CHF History: 0 HTN History: 0 Age : 2 Diabetes History: 0 Stroke History: 0 Vascular Disease History: 0 Gender: 0      ASSESSMENT AND PLAN: 1. New onset atrial Fibrillation  The patient's CHA2DS2-VASc score is 2, indicating a 2.2% annual risk of stroke.   Duration unknown given his lack of symptoms.  General education about afib provided and questions answered. We also discussed his stroke risk and the risks and benefits of anticoagulation. Will plan to start Eliquis 2.5 mg (weight <60 kg, about to turn 79 y/o) He is not taking any seizure medications at this time. Check echocardiogram After discussion with the patient and his daughter, will plan for conservative approach with rate control given his age, controlled rates, and paucity of symptoms.   2. Secondary Hypercoagulable State (ICD10:  D68.69) The patient is at significant risk for stroke/thromboembolism based upon his CHA2DS2-VASc Score of 2.  Start Apixaban (Eliquis).   3. H/o alcohol abuse He has not had a drink in >2 years.   Follow up in the AF clinic in one month.    [facility] [address] [place], [place] [address] [phone] 03/10/2020 3:26 PM

## 2020-03-18 ENCOUNTER — Other Ambulatory Visit: Payer: Self-pay

## 2020-03-18 ENCOUNTER — Encounter: Payer: Self-pay | Admitting: Critical Care Medicine

## 2020-03-18 ENCOUNTER — Telehealth: Payer: Self-pay

## 2020-03-18 ENCOUNTER — Ambulatory Visit: Payer: Medicare Other | Attending: Critical Care Medicine | Admitting: Critical Care Medicine

## 2020-03-18 VITALS — BP 110/72 | HR 93 | Temp 98.0°F | Resp 20 | Ht 65.0 in | Wt 127.0 lb

## 2020-03-18 DIAGNOSIS — Z7951 Long term (current) use of inhaled steroids: Secondary | ICD-10-CM | POA: Diagnosis not present

## 2020-03-18 DIAGNOSIS — R634 Abnormal weight loss: Secondary | ICD-10-CM | POA: Insufficient documentation

## 2020-03-18 DIAGNOSIS — Z79899 Other long term (current) drug therapy: Secondary | ICD-10-CM | POA: Insufficient documentation

## 2020-03-18 DIAGNOSIS — Z87891 Personal history of nicotine dependence: Secondary | ICD-10-CM | POA: Insufficient documentation

## 2020-03-18 DIAGNOSIS — Z8249 Family history of ischemic heart disease and other diseases of the circulatory system: Secondary | ICD-10-CM | POA: Diagnosis not present

## 2020-03-18 DIAGNOSIS — I4891 Unspecified atrial fibrillation: Secondary | ICD-10-CM | POA: Insufficient documentation

## 2020-03-18 DIAGNOSIS — J84112 Idiopathic pulmonary fibrosis: Secondary | ICD-10-CM | POA: Diagnosis present

## 2020-03-18 DIAGNOSIS — Z7901 Long term (current) use of anticoagulants: Secondary | ICD-10-CM | POA: Diagnosis not present

## 2020-03-18 DIAGNOSIS — Z23 Encounter for immunization: Secondary | ICD-10-CM | POA: Insufficient documentation

## 2020-03-18 MED ORDER — AZITHROMYCIN 250 MG PO TABS: ORAL_TABLET | ORAL | 0 refills | Status: DC

## 2020-03-18 MED ORDER — SYMBICORT 160-4.5 MCG/ACT IN AERO: 2.0000 | INHALATION_SPRAY | Freq: Two times a day (BID) | RESPIRATORY_TRACT | 6 refills | Status: DC

## 2020-03-18 MED ORDER — ALBUTEROL SULFATE HFA 108 (90 BASE) MCG/ACT IN AERS: 2.0000 | INHALATION_SPRAY | Freq: Four times a day (QID) | RESPIRATORY_TRACT | 1 refills | Status: DC | PRN

## 2020-03-18 MED ORDER — PREDNISONE 10 MG PO TABS: ORAL_TABLET | ORAL | 0 refills | Status: DC

## 2020-03-18 MED ORDER — APIXABAN 2.5 MG PO TABS: ORAL_TABLET | ORAL | 6 refills | Status: DC

## 2020-03-18 NOTE — Assessment & Plan Note (Signed)
Obtain metabolic panel and I gave patient sample of nutritional supplements

## 2020-03-18 NOTE — Progress Notes (Signed)
Here to f /u COPD

## 2020-03-18 NOTE — Patient Instructions (Signed)
No change in inhalers  Use nutritional supplement  Take prednisone and azithromycin for bronchitis  A CT chest will be obtained  We will see if we can obtain assisted living  A pneumovax and Tdap was given  Return in 3 weeks

## 2020-03-18 NOTE — Assessment & Plan Note (Signed)
Pulmonary fibrosis uncertain etiology 1 possibility is the patient is having recurrent aspiration.  Plan will be to pulse prednisone again 40 mg a day for 5 days begin Symbicort 2 inhalations twice daily albuterol as needed and give another course of azithromycin  We will administer Pneumovax 23 valent and Tdap and will obtain a thiamine level as well will obtain CT scan of chest CBC and blood count with metabolic panel

## 2020-03-18 NOTE — Telephone Encounter (Addendum)
Met with the patient and his niece, Levada Dy, when he was in the clinic today for his appointment.  He is currently living at [facility] with Levada Dy # [phone] and his sister, [name] # [phone].  His sister is turning 80 yo and recently lost the house that she had been staying in for 29 years.   They are hoping to find permanent housing.  They also wish to place the patient in ALF as his care and supervision needs have increased. The patient said that he is in agreement.  He also said that he understands that he will need to relinquish most of his social security check to the facility if admitted.  Levada Dy said that [name] also understands that the patient will no longer be receiving his monthly social security check and he will only receive a small stipend. Levada Dy said that they are interested in placement at [facility]  - contact Harriet Pho # [phone].  This CM explained that an option for him in the meantime would be [facility]CS but they were not interested at this time. CAP services  may also be an option but there is about a 6 month wait list.  This CM to contact [facility] regarding placement.  Call placed to Harriet Pho, voicemail full, unable to leave a message. Call placed to [facility]  # [phone] , message left for Joyce/Admissions requesting a call back to this CM.  Message received from [place].  Call returned to her # [phone] and she explained that they are not ALF.   Attempted to contact [name] and Levada Dy to inform them that [facility]  is not ALF and [facility] has a male medicaid bed if they are interested. Messages left for both [name] and Levada Dy requesting a call back to this CM.

## 2020-03-18 NOTE — Progress Notes (Signed)
Subjective:    Patient ID: [name], male    DOB: May 11, 1941, 79 y.o.   MRN: [id number]  Is an 79 year old frail male who is history of prior alcohol use seizure disorder and now new onset atrial fibrillation.  Patient was in the emergency room on May 6 and presented with persistent cough found to be in atrial fibrillation with rate controlled.  He was given a course of cortisone and antibiotics for presumed URI.  He has a chads vas score of 2 and cardiology then saw the patient recently placed him on low-dose apixaban but he did not need other medications.  Patient comes in today with his niece and they are staying in a hotel currently.  She states that he has significant needs and his spouse is not able to care for him any longer.  She is requesting consideration for assisted living.  The patient does have Medicare and Medicaid.  Currently the patient still has shortness of breath and a productive cough.  He still symptomatic in some regards.   The patient is not eating as well as he used to.  He is less active.  He is having no bleeding on the anticoagulant.  He denies chest pain.  He does have a dry cough.  He has troubles with swallowing at times.  His weight is down.  He is yet to receive the Covid vaccine.  Review of the chart shows prior x-ray showing evidence of fibrotic changes in the lungs the patient is a former smoker not currently smoking at this time  Past Medical History:  Diagnosis Date  . Alcohol abuse   . Seizures ([place])      Family History  Problem Relation Age of Onset  . Hypertension Other      Social History   Socioeconomic History  . Marital status: Divorced    Spouse name: Not on file  . Number of children: Not on file  . Years of education: Not on file  . Highest education level: Not on file  Occupational History  . Not on file  Tobacco Use  . Smoking status: Former Research scientist (life sciences)  . Smokeless tobacco: Never Used  Substance and Sexual Activity  . Alcohol  use: Not Currently    Alcohol/week: 6.0 standard drinks    Types: 6 Cans of beer per week  . Drug use: No  . Sexual activity: Not Currently  Other Topics Concern  . Not on file  Social History Narrative  . Not on file   Social Determinants of Health   Financial Resource Strain:   . Difficulty of Paying Living Expenses:   Food Insecurity:   . Worried About Charity fundraiser in the Last Year:   . Arboriculturist in the Last Year:   Transportation Needs:   . Film/video editor (Medical):   Marland Kitchen Lack of Transportation (Non-Medical):   Physical Activity:   . Days of Exercise per Week:   . Minutes of Exercise per Session:   Stress:   . Feeling of Stress :   Social Connections:   . Frequency of Communication with Friends and Family:   . Frequency of Social Gatherings with Friends and Family:   . Attends Religious Services:   . Active Member of Clubs or Organizations:   . Attends Archivist Meetings:   Marland Kitchen Marital Status:   Intimate Partner Violence:   . Fear of Current or Ex-Partner:   . Emotionally Abused:   Marland Kitchen Physically  Abused:   . Sexually Abused:      No Known Allergies   Outpatient Medications Prior to Visit  Medication Sig Dispense Refill  . benzonatate (TESSALON) 100 MG capsule Take 1 capsule (100 mg total) by mouth every 8 (eight) hours. 21 capsule 0  . albuterol (VENTOLIN HFA) 108 (90 Base) MCG/ACT inhaler     . apixaban (ELIQUIS) 2.5 MG TABS tablet Take one tablet by mouth twice daily 60 tablet 6  . SYMBICORT 160-4.5 MCG/ACT inhaler      No facility-administered medications prior to visit.     Review of Systems  Constitutional: Positive for activity change, appetite change, fatigue and unexpected weight change. Negative for fever.  HENT: Positive for trouble swallowing. Negative for postnasal drip, rhinorrhea, sinus pressure, sinus pain, sneezing and sore throat.   Respiratory: Positive for cough, shortness of breath and wheezing. Negative for  choking.   Cardiovascular: Negative for chest pain, palpitations and leg swelling.  Gastrointestinal: Negative.   Genitourinary: Negative.   Musculoskeletal: Negative.   Neurological: Positive for weakness.  Psychiatric/Behavioral: Positive for decreased concentration. Negative for self-injury. The patient is not nervous/anxious.        Objective:   Physical Exam Vitals:   03/18/20 0835  BP: 110/72  Pulse: 93  Resp: 20  Temp: 98 F (36.7 C)  SpO2: 98%  Weight: 127 lb (57.6 kg)  Height: 5\' 5"  (1.651 m)    Gen: Pleasant, thin, in no distress,  normal affect  ENT: No lesions,  mouth clear,  oropharynx clear, no postnasal drip  Neck: No JVD, no TMG, no carotid bruits  Lungs: No use of accessory muscles, no dullness to percussion, dry rales bilaterally poor breath sounds  Cardiovascular: RRR, heart sounds normal, no murmur or gallops, no peripheral edema  Abdomen: soft and NT, no HSM,  BS normal  Musculoskeletal: No deformities, no cyanosis or clubbing  Neuro: alert, non focal  Skin: Warm, no lesions or rashes  All imaging studies and lab results are reviewed in the [facility] electronic record system     Assessment & Plan:  I personally reviewed all images and lab data in the [facility] system as well as any outside material available during this office visit and agree with the  radiology impressions.   IPF (idiopathic pulmonary fibrosis) (HCC) Pulmonary fibrosis uncertain etiology 1 possibility is the patient is having recurrent aspiration.  Plan will be to pulse prednisone again 40 mg a day for 5 days begin Symbicort 2 inhalations twice daily albuterol as needed and give another course of azithromycin  We will administer Pneumovax 23 valent and Tdap and will obtain a thiamine level as well will obtain CT scan of chest CBC and blood count with metabolic panel  Weight loss Obtain metabolic panel and I gave patient sample of nutritional supplements   Diagnoses and all  orders for this visit:  IPF (idiopathic pulmonary fibrosis) (HCC) -     Comprehensive metabolic panel -     CBC with Differential/Platelet -     CT Chest Wo Contrast; Future  Weight loss -     CBC with Differential/Platelet -     Vitamin B1  Atrial fibrillation, unspecified type ([place])  Other orders -     azithromycin (ZITHROMAX) 250 MG tablet; Take two once then one daily until gone -     apixaban (ELIQUIS) 2.5 MG TABS tablet; Take one tablet by mouth twice daily -     albuterol (VENTOLIN HFA) 108 (90 Base)  MCG/ACT inhaler; Inhale 2 puffs into the lungs every 6 (six) hours as needed for wheezing or shortness of breath. -     SYMBICORT 160-4.5 MCG/ACT inhaler; Inhale 2 puffs into the lungs in the morning and at bedtime. -     predniSONE (DELTASONE) 10 MG tablet; Take 4 tablets daily for 5 days then stop -     Tdap vaccine greater than or equal to 7yo IM -     Pneumococcal polysaccharide vaccine 23-valent greater than or equal to 2yo subcutaneous/IM

## 2020-03-19 ENCOUNTER — Telehealth: Payer: Self-pay

## 2020-03-19 NOTE — Telephone Encounter (Signed)
Call placed to patient's sister, [name].  Explained to her that [facility] does not provide assisted living.  Informed her that [facility] has a male medicaid bed available in their assisted living facility. Provided her with the address and phone number and encouraged her to call and /or visit the facility to learn more. She explained that she is anxious to have him placed in a facility that can provide assistance with his daily needs.  She also noted that he is not able to read or write.

## 2020-03-23 ENCOUNTER — Ambulatory Visit (HOSPITAL_COMMUNITY)
Admission: RE | Admit: 2020-03-23 | Discharge: 2020-03-23 | Disposition: A | Discharge status: Home / Self Care | Payer: Medicare Other | Source: Ambulatory Visit | Attending: Physician Assistant | Admitting: Physician Assistant

## 2020-03-23 ENCOUNTER — Other Ambulatory Visit: Payer: Self-pay

## 2020-03-23 DIAGNOSIS — R0602 Shortness of breath: Secondary | ICD-10-CM | POA: Diagnosis not present

## 2020-03-23 DIAGNOSIS — I351 Nonrheumatic aortic (valve) insufficiency: Secondary | ICD-10-CM | POA: Insufficient documentation

## 2020-03-23 DIAGNOSIS — I517 Cardiomegaly: Secondary | ICD-10-CM | POA: Diagnosis not present

## 2020-03-23 DIAGNOSIS — R05 Cough: Secondary | ICD-10-CM | POA: Insufficient documentation

## 2020-03-23 DIAGNOSIS — I4891 Unspecified atrial fibrillation: Secondary | ICD-10-CM | POA: Diagnosis present

## 2020-03-23 NOTE — Progress Notes (Signed)
  Echocardiogram 2D Echocardiogram has been performed.  Darlina Sicilian M 03/23/2020, 2:28 PM

## 2020-03-24 ENCOUNTER — Telehealth: Payer: Self-pay

## 2020-03-24 NOTE — Telephone Encounter (Signed)
[name] called stating she toured [facility] as advised &  Needs the FL2 forms to proceed. Terry was able to reach [place] for further assistance. If anything else is needed please call Terry.

## 2020-03-24 NOTE — Telephone Encounter (Signed)
Call placed to patient's sister, [name] to follow up on ALF placement.  She said that they visited [facility], met with staff there and saw a patient room.  She was pleased and requested that the [facility] be faxed to that facility.  FL2 faxed to [facility] as requested.

## 2020-03-25 ENCOUNTER — Ambulatory Visit (HOSPITAL_COMMUNITY)
Admission: RE | Admit: 2020-03-25 | Discharge: 2020-03-25 | Disposition: A | Discharge status: Home / Self Care | Payer: Medicare Other | Source: Ambulatory Visit | Attending: Critical Care Medicine | Admitting: Critical Care Medicine

## 2020-03-25 DIAGNOSIS — J84112 Idiopathic pulmonary fibrosis: Secondary | ICD-10-CM | POA: Insufficient documentation

## 2020-03-31 ENCOUNTER — Telehealth: Payer: Self-pay

## 2020-03-31 ENCOUNTER — Other Ambulatory Visit: Payer: Self-pay

## 2020-03-31 ENCOUNTER — Ambulatory Visit: Payer: Medicare Other | Attending: Critical Care Medicine

## 2020-03-31 VITALS — Temp 98.1°F

## 2020-03-31 DIAGNOSIS — J84112 Idiopathic pulmonary fibrosis: Secondary | ICD-10-CM

## 2020-03-31 DIAGNOSIS — Z111 Encounter for screening for respiratory tuberculosis: Secondary | ICD-10-CM | POA: Diagnosis not present

## 2020-03-31 NOTE — Progress Notes (Signed)
DOB and Name verified PPD Placement note patient is here today for placement of PPD test Reason for PPD test: screening  Pt taken PPD test before: yes Verified in allergy area and with patient that they are not allergic to the products PPD is made of (Phenol or Tween).  None verbalized Is patient taking any oral or IV steroid medication now or have they taken it in the last month? no Has the patient ever received the BCG vaccine?: no Denies any previous positive PPD test  P: test performed today / well tolerated   Patient advised to return for reading within 48-72 hours. Appointment scheduled. Verbalized understanding  COVID screening testing was done today as well / Test well tolerated/ No reaction noted 15 min after performing the test

## 2020-03-31 NOTE — Telephone Encounter (Signed)
Call received from [facility]. She explained that they have a bed available for the patient. He needs PPD and COVID test prior to placement. He can be placed the day after the negative results are received.  Call placed to patient's sister, [name], and explained above noted conversation.  She said that she can bring her brother to [facility] this afternoon for the tests.  She has the address for the clinic.

## 2020-04-01 ENCOUNTER — Telehealth: Payer: Self-pay

## 2020-04-01 ENCOUNTER — Other Ambulatory Visit: Payer: Self-pay | Admitting: Critical Care Medicine

## 2020-04-01 DIAGNOSIS — Z111 Encounter for screening for respiratory tuberculosis: Secondary | ICD-10-CM

## 2020-04-01 LAB — SARS-COV-2, NAA 2 DAY TAT

## 2020-04-01 LAB — NOVEL CORONAVIRUS, NAA: SARS-CoV-2, NAA: NOT DETECTED

## 2020-04-01 NOTE — Telephone Encounter (Signed)
Call placed to [facility]. Explained to her that the patient had PPD and COVID testing done yesterday and we will notify her of the test results when we have them . If results negative and received tomorrow, patient may be ready for move on 04/05/2020

## 2020-04-02 ENCOUNTER — Other Ambulatory Visit: Payer: Self-pay

## 2020-04-02 ENCOUNTER — Telehealth: Payer: Self-pay

## 2020-04-02 ENCOUNTER — Ambulatory Visit: Payer: Medicare Other | Attending: Critical Care Medicine

## 2020-04-02 VITALS — Temp 97.6°F

## 2020-04-02 DIAGNOSIS — Z111 Encounter for screening for respiratory tuberculosis: Secondary | ICD-10-CM

## 2020-04-02 LAB — TB SKIN TEST
Induration: 0 mm
TB Skin Test: NEGATIVE

## 2020-04-02 NOTE — Telephone Encounter (Signed)
Call placed to [facility].  Informed her that patient's PPD and COVID tests are negative and results were faxed to the facility.  She will contact patient's family to arrange move it.

## 2020-04-02 NOTE — Progress Notes (Signed)
Pt is here with his niece for PPD reading  Reading done today / Negative 0 induration Results in Epic  Letter provided today to patient and niece.  Made Betti Cruz, RN CM aware

## 2020-04-06 ENCOUNTER — Telehealth: Payer: Self-pay

## 2020-04-06 NOTE — Telephone Encounter (Signed)
Call placed to [facility] and confirmed that the patient has been admitted there.

## 2020-04-12 ENCOUNTER — Encounter (HOSPITAL_COMMUNITY): Payer: Self-pay

## 2020-04-12 ENCOUNTER — Ambulatory Visit (HOSPITAL_COMMUNITY): Payer: Medicare Other | Admitting: Physician Assistant

## 2020-04-12 ENCOUNTER — Ambulatory Visit: Payer: Medicare Other | Admitting: Critical Care Medicine

## 2020-04-26 ENCOUNTER — Encounter (HOSPITAL_COMMUNITY): Payer: Self-pay

## 2020-04-26 ENCOUNTER — Other Ambulatory Visit: Payer: Self-pay | Admitting: Critical Care Medicine

## 2020-04-26 ENCOUNTER — Ambulatory Visit (HOSPITAL_COMMUNITY)
Admission: RE | Admit: 2020-04-26 | Discharge: 2020-04-26 | Disposition: A | Discharge status: Home / Self Care | Payer: Medicare Other | Source: Ambulatory Visit | Attending: Physician Assistant | Admitting: Physician Assistant

## 2020-04-26 ENCOUNTER — Encounter (HOSPITAL_COMMUNITY): Payer: Self-pay | Admitting: Physician Assistant

## 2020-04-26 VITALS — BP 106/60 | HR 122 | Ht 65.0 in | Wt 115.6 lb

## 2020-04-26 DIAGNOSIS — I4821 Permanent atrial fibrillation: Secondary | ICD-10-CM | POA: Insufficient documentation

## 2020-04-26 DIAGNOSIS — Z79899 Other long term (current) drug therapy: Secondary | ICD-10-CM | POA: Insufficient documentation

## 2020-04-26 DIAGNOSIS — Z87891 Personal history of nicotine dependence: Secondary | ICD-10-CM | POA: Insufficient documentation

## 2020-04-26 DIAGNOSIS — Z7951 Long term (current) use of inhaled steroids: Secondary | ICD-10-CM | POA: Diagnosis not present

## 2020-04-26 DIAGNOSIS — G40909 Epilepsy, unspecified, not intractable, without status epilepticus: Secondary | ICD-10-CM | POA: Insufficient documentation

## 2020-04-26 DIAGNOSIS — Z7901 Long term (current) use of anticoagulants: Secondary | ICD-10-CM | POA: Diagnosis not present

## 2020-04-26 DIAGNOSIS — D6869 Other thrombophilia: Secondary | ICD-10-CM | POA: Diagnosis not present

## 2020-04-26 LAB — CBC
HCT: 39.6 % (ref 39.0–52.0)
Hemoglobin: 13.6 g/dL (ref 13.0–17.0)
MCH: 28.4 pg (ref 26.0–34.0)
MCHC: 34.3 g/dL (ref 30.0–36.0)
MCV: 82.7 fL (ref 80.0–100.0)
Platelets: 202 10*3/uL (ref 150–400)
RBC: 4.79 MIL/uL (ref 4.22–5.81)
RDW: 13.6 % (ref 11.5–15.5)
WBC: 7.3 10*3/uL (ref 4.0–10.5)
nRBC: 0 % (ref 0.0–0.2)

## 2020-04-26 LAB — BASIC METABOLIC PANEL
Anion gap: 10 (ref 5–15)
BUN: 11 mg/dL (ref 8–23)
CO2: 27 mmol/L (ref 22–32)
Calcium: 9.5 mg/dL (ref 8.9–10.3)
Chloride: 103 mmol/L (ref 98–111)
Creatinine, Ser: 1.09 mg/dL (ref 0.61–1.24)
GFR calc Af Amer: >60 mL/min (ref >60)
GFR calc non Af Amer: >60 mL/min (ref >60)
Glucose, Bld: 112 mg/dL — ABNORMAL HIGH (ref 70–99)
Potassium: 4.2 mmol/L (ref 3.5–5.1)
Sodium: 140 mmol/L (ref 135–145)

## 2020-04-26 MED ORDER — DILTIAZEM HCL ER COATED BEADS 120 MG PO CP24
120.0000 mg | ORAL_CAPSULE | Freq: Every day | ORAL | 3 refills | Status: AC
Start: 2020-04-26 — End: ?

## 2020-04-26 NOTE — Progress Notes (Signed)
Primary Care Physician: Elsie Stain, MD Primary Cardiologist: none Primary Electrophysiologist: none Referring Physician: [facility]   [name] is a 79 y.o. male with a history of alcohol abuse, seizure disorder, and new onset atrial fibrillation who presents for follow up in the [facility]. The patient was initially diagnosed with atrial fibrillation 03/04/20 after presenting to the ED with a persistent cough. He was incidently found to be in rate controlled afib. He was started on a Z-Pak and steroids for presumed URI. Patient has a CHADS2VASC score of 2. He is asymptomatic with his arrhythmia. It is unclear when this began as he has not seen a doctor in years. He has follow up with [facility] for his respiratory issues.   On follow up today, patient reports that he has done reasonably well. He does have infrequent palpitations but these are not bothersome for him. He does use his inhalers and nebulizer often. He denies bleeding issues on anticoagulation.   Today, he denies symptoms of chest pain, orthopnea, PND, lower extremity edema, dizziness, presyncope, syncope, snoring, daytime somnolence, bleeding, or neurologic sequela. The patient is tolerating medications without difficulties and is otherwise without complaint today.    Atrial Fibrillation Risk Factors:  he does not have symptoms or diagnosis of sleep apnea. he does have a history of alcohol use.  he has a BMI of Body mass index is 19.24 kg/m.Marland Kitchen Filed Weights   04/26/20 1022  Weight: 52.4 kg    Family History  Problem Relation Age of Onset   Hypertension Other      Atrial Fibrillation Management history:  Previous antiarrhythmic drugs: none Previous cardioversions: none Previous ablations: none CHADS2VASC score: 2 Anticoagulation history: Eliquis   Past Medical History:  Diagnosis Date   Alcohol abuse    Seizures ([place])    No past surgical  history on file.  Current Outpatient Medications  Medication Sig Dispense Refill   albuterol (VENTOLIN HFA) 108 (90 Base) MCG/ACT inhaler Inhale 2 puffs into the lungs every 6 (six) hours as needed for wheezing or shortness of breath. 18 g 1   apixaban (ELIQUIS) 2.5 MG TABS tablet Take one tablet by mouth twice daily 60 tablet 6   ipratropium-albuterol (DUONEB) 0.5-2.5 (3) MG/3ML SOLN SMARTSIG:1 Via Nebulizer Every 4-6 Hours PRN     SYMBICORT 160-4.5 MCG/ACT inhaler Inhale 2 puffs into the lungs in the morning and at bedtime. 1 Inhaler 6   diltiazem (CARDIZEM CD) 120 MG 24 hr capsule Take 1 capsule (120 mg total) by mouth daily. Hold for HR less than 55 or BP less than 100/60 30 capsule 3   No current facility-administered medications for this encounter.    No Known Allergies  Social History   Socioeconomic History   Marital status: Divorced    Spouse name: Not on file   Number of children: Not on file   Years of education: Not on file   Highest education level: Not on file  Occupational History   Not on file  Tobacco Use   Smoking status: Former Smoker   Smokeless tobacco: Never Used  Substance and Sexual Activity   Alcohol use: Not Currently    Alcohol/week: 6.0 standard drinks    Types: 6 Cans of beer per week   Drug use: No   Sexual activity: Not Currently  Other Topics Concern   Not on file  Social History Narrative   Not on file   Social Determinants of  Health   Financial Resource Strain:    Difficulty of Paying Living Expenses:   Food Insecurity:    Worried About Charity fundraiser in the Last Year:    Arboriculturist in the Last Year:   Transportation Needs:    Film/video editor (Medical):    Lack of Transportation (Non-Medical):   Physical Activity:    Days of Exercise per Week:    Minutes of Exercise per Session:   Stress:    Feeling of Stress :   Social Connections:    Frequency of Communication with Friends and Family:     Frequency of Social Gatherings with Friends and Family:    Attends Religious Services:    Active Member of Clubs or Organizations:    Attends Music therapist:    Marital Status:   Intimate Partner Violence:    Fear of Current or Ex-Partner:    Emotionally Abused:    Physically Abused:    Sexually Abused:      ROS- All systems are reviewed and negative except as per the HPI above.  Physical Exam: Vitals:   04/26/20 1022  BP: 106/60  Pulse: (!) 122  Weight: 52.4 kg  Height: 5\' 5"  (1.651 m)    GEN- The patient is a cachectic appearing elderly male, alert and oriented x 3 today.   HEENT-head normocephalic, atraumatic, sclera clear, conjunctiva pink, hearing intact, trachea midline. Lungs- wheezing bilaterally, normal work of breathing Heart- irregular rate and rhythm, no murmurs, rubs or gallops  GI- soft, NT, ND, + BS Extremities- no clubbing, cyanosis, or edema MS- no significant deformity or atrophy Skin- no rash or lesion Psych- euthymic mood, full affect Neuro- strength and sensation are intact   Wt Readings from Last 3 Encounters:  04/26/20 52.4 kg  03/18/20 57.6 kg  03/10/20 58 kg    EKG today demonstrates afib HR 122, QRS 70, QTc 396  Echo 03/23/20 1. Left ventricular ejection fraction, by estimation, is 45 to 50%. The  left ventricle has normal function. The left ventricle demonstrates global  hypokinesis. There is mild left ventricular hypertrophy of the basal  segment. Left ventricular diastolic  function could not be evaluated.  2. Right ventricular systolic function is mildly reduced. The right  ventricular size is normal. There is normal pulmonary artery systolic  pressure.  3. The mitral valve is grossly normal. Mild mitral valve regurgitation.  4. The aortic valve is tricuspid. Aortic valve regurgitation is trivial.  Mild aortic valve sclerosis is present, with no evidence of aortic valve  stenosis.  5. The inferior  vena cava is normal in size with greater than 50%  respiratory variability, suggesting right atrial pressure of 3 mmHg.   Epic records are reviewed at length today  CHA2DS2-VASc Score = 2  The patient's score is based upon: CHF History: 0 HTN History: 0 Age : 1 Diabetes History: 0 Stroke History: 0 Vascular Disease History: 0 Gender: 0      ASSESSMENT AND PLAN: 1. Permanent atrial Fibrillation  The patient's CHA2DS2-VASc score is 2, indicating a 2.2% annual risk of stroke.   Duration unknown given his lack of symptoms.  His heart rates initially elevated today, better controlled on exam. Will start diltiazem 120 mg daily. Continue Eliquis 2.5 mg (weight <60 kg, 79 y/o in July) Check CBC/bmet Patient and family prefer conservative approach with rate control given his age and paucity of symptoms.   2. Secondary Hypercoagulable State (ICD10:  D68.69) The  patient is at significant risk for stroke/thromboembolism based upon his CHA2DS2-VASc Score of 2.  Continue Apixaban (Eliquis).   3. H/o alcohol abuse He has not had a drink in >2 years.   Follow up in the AF clinic in 2 weeks.   [facility] [address] [place], [place] [address] [phone] 04/26/2020 10:47 AM

## 2020-04-26 NOTE — Patient Instructions (Addendum)
Start Cardizem 120mg  once a day  Hold for heart rate less than 55 or blood pressure less than 100/60.  Please check blood pressure and heart rate daily

## 2020-04-28 ENCOUNTER — Telehealth: Payer: Self-pay

## 2020-04-28 NOTE — Telephone Encounter (Signed)
Signed orders for the adult care home faxed to [facility] -attn : Wells Guiles.

## 2020-05-10 ENCOUNTER — Ambulatory Visit (HOSPITAL_COMMUNITY)
Admission: RE | Admit: 2020-05-10 | Discharge: 2020-05-10 | Disposition: A | Discharge status: Home / Self Care | Payer: Medicare Other | Source: Ambulatory Visit | Attending: Physician Assistant | Admitting: Physician Assistant

## 2020-05-10 ENCOUNTER — Encounter (HOSPITAL_COMMUNITY): Payer: Self-pay | Admitting: Physician Assistant

## 2020-05-10 ENCOUNTER — Other Ambulatory Visit: Payer: Self-pay

## 2020-05-10 VITALS — BP 114/68 | HR 101 | Ht 65.0 in | Wt 110.2 lb

## 2020-05-10 DIAGNOSIS — Z79899 Other long term (current) drug therapy: Secondary | ICD-10-CM | POA: Diagnosis not present

## 2020-05-10 DIAGNOSIS — I4819 Other persistent atrial fibrillation: Secondary | ICD-10-CM

## 2020-05-10 DIAGNOSIS — D6869 Other thrombophilia: Secondary | ICD-10-CM | POA: Diagnosis not present

## 2020-05-10 DIAGNOSIS — Z7901 Long term (current) use of anticoagulants: Secondary | ICD-10-CM | POA: Diagnosis not present

## 2020-05-10 DIAGNOSIS — Z87891 Personal history of nicotine dependence: Secondary | ICD-10-CM | POA: Diagnosis not present

## 2020-05-10 NOTE — Progress Notes (Signed)
Primary Care Physician: Elsie Stain, MD Primary Cardiologist: none Primary Electrophysiologist: none Referring Physician: [facility]   [name] is a 79 y.o. male with a history of alcohol abuse, seizure disorder, and persistent atrial fibrillation who presents for follow up in the [facility]. The patient was initially diagnosed with atrial fibrillation 03/04/20 after presenting to the ED with a persistent cough. He was incidently found to be in rate controlled afib. He was started on a Z-Pak and steroids for presumed URI. Patient has a CHADS2VASC score of 2. He is asymptomatic with his arrhythmia. It is unclear when this began as he had not seen a doctor in years.   On follow up today, patient is unaccompanied. He is a somewhat difficult historian. He states he is doing well overall. His heart rate will become more elevated with exertion. He denies bleeding issues on anticoagulation. He is now living at [facility].   Today, he denies symptoms of palpitations, chest pain, orthopnea, PND, lower extremity edema, dizziness, presyncope, syncope, snoring, daytime somnolence, bleeding, or neurologic sequela. The patient is tolerating medications without difficulties and is otherwise without complaint today.    Atrial Fibrillation Risk Factors:  he does not have symptoms or diagnosis of sleep apnea. he does have a history of alcohol use.  he has a BMI of Body mass index is 18.34 kg/m.Marland Kitchen Filed Weights   05/10/20 1013  Weight: 50 kg    Family History  Problem Relation Age of Onset  . Hypertension Other      Atrial Fibrillation Management history:  Previous antiarrhythmic drugs: none Previous cardioversions: none Previous ablations: none CHADS2VASC score: 2 Anticoagulation history: Eliquis   Past Medical History:  Diagnosis Date  . Alcohol abuse   . Seizures ([place])    No past surgical history on file.  Current Outpatient Medications   Medication Sig Dispense Refill  . diltiazem (CARDIZEM CD) 120 MG 24 hr capsule Take 1 capsule (120 mg total) by mouth daily. Hold for HR less than 55 or BP less than 100/60 30 capsule 3  . ELIQUIS 2.5 MG TABS tablet TAKE 1 TABLET BY MOUTH TWICE A DAY. 60 tablet 2  . ipratropium-albuterol (DUONEB) 0.5-2.5 (3) MG/3ML SOLN SMARTSIG:1 Via Nebulizer Every 4-6 Hours PRN    . SYMBICORT 160-4.5 MCG/ACT inhaler INHALE 2 PUFFS INTO THE LUNGS IN THE MORNING AND AT BEDTIME 10.2 g 2  . VENTOLIN HFA 108 (90 Base) MCG/ACT inhaler INHALE 2 PUFFS INTO THE LUNGS EVERY 6 HOURS AS NEEDED FOR WHEEZING OR SHORTNESS OF BREATH. 18 g 0   No current facility-administered medications for this encounter.    No Known Allergies  Social History   Socioeconomic History  . Marital status: Divorced    Spouse name: Not on file  . Number of children: Not on file  . Years of education: Not on file  . Highest education level: Not on file  Occupational History  . Not on file  Tobacco Use  . Smoking status: Former Research scientist (life sciences)  . Smokeless tobacco: Never Used  Substance and Sexual Activity  . Alcohol use: Not Currently    Alcohol/week: 6.0 standard drinks    Types: 6 Cans of beer per week  . Drug use: No  . Sexual activity: Not Currently  Other Topics Concern  . Not on file  Social History Narrative  . Not on file   Social Determinants of Health   Financial Resource Strain:   . Difficulty of  Paying Living Expenses:   Food Insecurity:   . Worried About Charity fundraiser in the Last Year:   . Arboriculturist in the Last Year:   Transportation Needs:   . Film/video editor (Medical):   Marland Kitchen Lack of Transportation (Non-Medical):   Physical Activity:   . Days of Exercise per Week:   . Minutes of Exercise per Session:   Stress:   . Feeling of Stress :   Social Connections:   . Frequency of Communication with Friends and Family:   . Frequency of Social Gatherings with Friends and Family:   . Attends Religious  Services:   . Active Member of Clubs or Organizations:   . Attends Archivist Meetings:   Marland Kitchen Marital Status:   Intimate Partner Violence:   . Fear of Current or Ex-Partner:   . Emotionally Abused:   Marland Kitchen Physically Abused:   . Sexually Abused:      ROS- All systems are reviewed and negative except as per the HPI above.  Physical Exam: Vitals:   05/10/20 1013  BP: 114/68  Pulse: (!) 101  Weight: 50 kg  Height: 5\' 5"  (1.651 m)    GEN- The patient is well appearing cachectic appearing elderly male, alert and oriented x 3 today.   HEENT-head normocephalic, atraumatic, sclera clear, conjunctiva pink, hearing intact, trachea midline. Lungs- Wheezing bilaterally, normal work of breathing Heart- irregular rate and rhythm, no murmurs, rubs or gallops  GI- soft, NT, ND, + BS Extremities- no clubbing, cyanosis, or edema MS- no significant deformity or atrophy Skin- no rash or lesion Psych- euthymic mood, full affect Neuro- strength and sensation are intact   Wt Readings from Last 3 Encounters:  05/10/20 50 kg  04/26/20 52.4 kg  03/18/20 57.6 kg    EKG today demonstrates afib HR 101, QRS 76, QTc 417  Echo 03/23/20 1. Left ventricular ejection fraction, by estimation, is 45 to 50%. The  left ventricle has normal function. The left ventricle demonstrates global  hypokinesis. There is mild left ventricular hypertrophy of the basal  segment. Left ventricular diastolic  function could not be evaluated.  2. Right ventricular systolic function is mildly reduced. The right  ventricular size is normal. There is normal pulmonary artery systolic  pressure.  3. The mitral valve is grossly normal. Mild mitral valve regurgitation.  4. The aortic valve is tricuspid. Aortic valve regurgitation is trivial.  Mild aortic valve sclerosis is present, with no evidence of aortic valve  stenosis.  5. The inferior vena cava is normal in size with greater than 50%  respiratory  variability, suggesting right atrial pressure of 3 mmHg.   Epic records are reviewed at length today  CHA2DS2-VASc Score = 2  The patient's score is based upon: CHF History: 0 HTN History: 0 Age : 2 Diabetes History: 0 Stroke History: 0 Vascular Disease History: 0 Gender: 0      ASSESSMENT AND PLAN: 1. Persistent Atrial Fibrillation  The patient's CHA2DS2-VASc score is 2, indicating a 2.2% annual risk of stroke.    Patient remains in afib. Heart rate is better controlled on diltiazem. Goal <110 bpm. Continue Eliquis 2.5 mg (weight <60 kg, 79 y/o in July) Patient and family prefer conservative approach with rate control given his age, controlled rates, and paucity of symptoms.   2. Secondary Hypercoagulable State (ICD10:  D68.69) The patient is at significant risk for stroke/thromboembolism based upon his CHA2DS2-VASc Score of 2.  Start Apixaban (Eliquis).  Follow up in the AF clinic in 3 months.    [facility] [address] [place],  [address] [phone] 05/10/2020 10:35 AM

## 2020-05-17 ENCOUNTER — Telehealth: Payer: Self-pay

## 2020-05-17 NOTE — Telephone Encounter (Signed)
Updated medication orders faxed top [facility]

## 2020-05-19 ENCOUNTER — Other Ambulatory Visit: Payer: Self-pay | Admitting: Critical Care Medicine

## 2020-05-21 ENCOUNTER — Emergency Department (HOSPITAL_COMMUNITY): Payer: Medicare Other

## 2020-05-21 ENCOUNTER — Encounter (HOSPITAL_COMMUNITY): Payer: Self-pay

## 2020-05-21 ENCOUNTER — Other Ambulatory Visit: Payer: Self-pay

## 2020-05-21 ENCOUNTER — Inpatient Hospital Stay (HOSPITAL_COMMUNITY)
Admission: EM | Admit: 2020-05-21 | Discharge: 2020-06-03 | DRG: 011 | Disposition: A | Discharge status: Other Acute Inpatient Hospital | Payer: Medicare Other | Attending: Family Medicine | Admitting: Family Medicine

## 2020-05-21 ENCOUNTER — Inpatient Hospital Stay (HOSPITAL_COMMUNITY): Payer: Medicare Other

## 2020-05-21 DIAGNOSIS — Z930 Tracheostomy status: Secondary | ICD-10-CM

## 2020-05-21 DIAGNOSIS — R1110 Vomiting, unspecified: Secondary | ICD-10-CM

## 2020-05-21 DIAGNOSIS — R531 Weakness: Secondary | ICD-10-CM | POA: Diagnosis not present

## 2020-05-21 DIAGNOSIS — Y848 Other medical procedures as the cause of abnormal reaction of the patient, or of later complication, without mention of misadventure at the time of the procedure: Secondary | ICD-10-CM | POA: Diagnosis not present

## 2020-05-21 DIAGNOSIS — Z7951 Long term (current) use of inhaled steroids: Secondary | ICD-10-CM | POA: Diagnosis not present

## 2020-05-21 DIAGNOSIS — J84112 Idiopathic pulmonary fibrosis: Secondary | ICD-10-CM | POA: Diagnosis present

## 2020-05-21 DIAGNOSIS — Z79899 Other long term (current) drug therapy: Secondary | ICD-10-CM

## 2020-05-21 DIAGNOSIS — Z515 Encounter for palliative care: Secondary | ICD-10-CM

## 2020-05-21 DIAGNOSIS — I4821 Permanent atrial fibrillation: Secondary | ICD-10-CM | POA: Diagnosis not present

## 2020-05-21 DIAGNOSIS — J449 Chronic obstructive pulmonary disease, unspecified: Secondary | ICD-10-CM | POA: Diagnosis present

## 2020-05-21 DIAGNOSIS — I10 Essential (primary) hypertension: Secondary | ICD-10-CM | POA: Diagnosis present

## 2020-05-21 DIAGNOSIS — K560 Paralytic ileus: Secondary | ICD-10-CM | POA: Diagnosis not present

## 2020-05-21 DIAGNOSIS — I4891 Unspecified atrial fibrillation: Secondary | ICD-10-CM

## 2020-05-21 DIAGNOSIS — I4819 Other persistent atrial fibrillation: Secondary | ICD-10-CM | POA: Diagnosis present

## 2020-05-21 DIAGNOSIS — D649 Anemia, unspecified: Secondary | ICD-10-CM | POA: Diagnosis present

## 2020-05-21 DIAGNOSIS — J9501 Hemorrhage from tracheostomy stoma: Secondary | ICD-10-CM | POA: Diagnosis not present

## 2020-05-21 DIAGNOSIS — T380X5A Adverse effect of glucocorticoids and synthetic analogues, initial encounter: Secondary | ICD-10-CM | POA: Diagnosis not present

## 2020-05-21 DIAGNOSIS — C329 Malignant neoplasm of larynx, unspecified: Secondary | ICD-10-CM | POA: Diagnosis not present

## 2020-05-21 DIAGNOSIS — R061 Stridor: Secondary | ICD-10-CM | POA: Diagnosis present

## 2020-05-21 DIAGNOSIS — Z20822 Contact with and (suspected) exposure to covid-19: Secondary | ICD-10-CM | POA: Diagnosis present

## 2020-05-21 DIAGNOSIS — R1310 Dysphagia, unspecified: Secondary | ICD-10-CM

## 2020-05-21 DIAGNOSIS — E43 Unspecified severe protein-calorie malnutrition: Secondary | ICD-10-CM | POA: Diagnosis present

## 2020-05-21 DIAGNOSIS — R0602 Shortness of breath: Secondary | ICD-10-CM | POA: Diagnosis present

## 2020-05-21 DIAGNOSIS — Z681 Body mass index (BMI) 19 or less, adult: Secondary | ICD-10-CM

## 2020-05-21 DIAGNOSIS — R64 Cachexia: Secondary | ICD-10-CM | POA: Diagnosis present

## 2020-05-21 DIAGNOSIS — C321 Malignant neoplasm of supraglottis: Principal | ICD-10-CM

## 2020-05-21 DIAGNOSIS — Z87891 Personal history of nicotine dependence: Secondary | ICD-10-CM

## 2020-05-21 DIAGNOSIS — Z7901 Long term (current) use of anticoagulants: Secondary | ICD-10-CM | POA: Diagnosis not present

## 2020-05-21 DIAGNOSIS — E876 Hypokalemia: Secondary | ICD-10-CM | POA: Diagnosis present

## 2020-05-21 DIAGNOSIS — I429 Cardiomyopathy, unspecified: Secondary | ICD-10-CM | POA: Diagnosis present

## 2020-05-21 DIAGNOSIS — Z66 Do not resuscitate: Secondary | ICD-10-CM | POA: Diagnosis not present

## 2020-05-21 DIAGNOSIS — R221 Localized swelling, mass and lump, neck: Secondary | ICD-10-CM

## 2020-05-21 DIAGNOSIS — R112 Nausea with vomiting, unspecified: Secondary | ICD-10-CM

## 2020-05-21 DIAGNOSIS — D141 Benign neoplasm of larynx: Secondary | ICD-10-CM | POA: Diagnosis present

## 2020-05-21 DIAGNOSIS — J387 Other diseases of larynx: Secondary | ICD-10-CM | POA: Diagnosis present

## 2020-05-21 DIAGNOSIS — Z7189 Other specified counseling: Secondary | ICD-10-CM | POA: Diagnosis not present

## 2020-05-21 DIAGNOSIS — D72829 Elevated white blood cell count, unspecified: Secondary | ICD-10-CM

## 2020-05-21 DIAGNOSIS — J9610 Chronic respiratory failure, unspecified whether with hypoxia or hypercapnia: Secondary | ICD-10-CM | POA: Diagnosis present

## 2020-05-21 HISTORY — DX: Chronic obstructive pulmonary disease, unspecified: J44.9

## 2020-05-21 HISTORY — DX: Atherosclerosis of aorta: I70.0

## 2020-05-21 HISTORY — DX: Cardiomyopathy, unspecified: I42.9

## 2020-05-21 HISTORY — DX: Other persistent atrial fibrillation: I48.19

## 2020-05-21 HISTORY — DX: Carcinoma in situ, unspecified: D09.9

## 2020-05-21 LAB — BASIC METABOLIC PANEL
Anion gap: 11 (ref 5–15)
BUN: 18 mg/dL (ref 8–23)
CO2: 26 mmol/L (ref 22–32)
Calcium: 9.1 mg/dL (ref 8.9–10.3)
Chloride: 104 mmol/L (ref 98–111)
Creatinine, Ser: 1.01 mg/dL (ref 0.61–1.24)
GFR calc Af Amer: >60 mL/min (ref >60)
GFR calc non Af Amer: >60 mL/min (ref >60)
Glucose, Bld: 102 mg/dL — ABNORMAL HIGH (ref 70–99)
Potassium: 3.4 mmol/L — ABNORMAL LOW (ref 3.5–5.1)
Sodium: 141 mmol/L (ref 135–145)

## 2020-05-21 LAB — BLOOD GAS, VENOUS
Acid-Base Excess: 0.1 mmol/L (ref 0.0–2.0)
Bicarbonate: 25.3 mmol/L (ref 20.0–28.0)
O2 Saturation: 74.4 %
Patient temperature: 98.6
pCO2, Ven: 46.1 mmHg (ref 44.0–60.0)
pH, Ven: 7.358 (ref 7.250–7.430)
pO2, Ven: 44.4 mmHg (ref 32.0–45.0)

## 2020-05-21 LAB — CBC WITH DIFFERENTIAL/PLATELET
Abs Immature Granulocytes: 0.02 10*3/uL (ref 0.00–0.07)
Basophils Absolute: 0.1 10*3/uL (ref 0.0–0.1)
Basophils Relative: 1 %
Eosinophils Absolute: 0.3 10*3/uL (ref 0.0–0.5)
Eosinophils Relative: 4 %
HCT: 33.5 % — ABNORMAL LOW (ref 39.0–52.0)
Hemoglobin: 11.9 g/dL — ABNORMAL LOW (ref 13.0–17.0)
Immature Granulocytes: 0 %
Lymphocytes Relative: 15 %
Lymphs Abs: 1.1 10*3/uL (ref 0.7–4.0)
MCH: 29.5 pg (ref 26.0–34.0)
MCHC: 35.5 g/dL (ref 30.0–36.0)
MCV: 82.9 fL (ref 80.0–100.0)
Monocytes Absolute: 0.8 10*3/uL (ref 0.1–1.0)
Monocytes Relative: 10 %
Neutro Abs: 5.3 10*3/uL (ref 1.7–7.7)
Neutrophils Relative %: 70 %
Platelets: 173 10*3/uL (ref 150–400)
RBC: 4.04 MIL/uL — ABNORMAL LOW (ref 4.22–5.81)
RDW: 13.5 % (ref 11.5–15.5)
WBC: 7.5 10*3/uL (ref 4.0–10.5)
nRBC: 0 % (ref 0.0–0.2)

## 2020-05-21 LAB — BRAIN NATRIURETIC PEPTIDE: B Natriuretic Peptide: 284.5 pg/mL — ABNORMAL HIGH (ref 0.0–100.0)

## 2020-05-21 LAB — SARS CORONAVIRUS 2 BY RT PCR (HOSPITAL ORDER, PERFORMED IN ~~LOC~~ HOSPITAL LAB): SARS Coronavirus 2: NEGATIVE

## 2020-05-21 MED ORDER — DILTIAZEM HCL ER COATED BEADS 120 MG PO CP24
120.0000 mg | ORAL_CAPSULE | Freq: Every day | ORAL | Status: DC
  Administered 2020-05-22 – 2020-05-28 (×4): 120 mg via ORAL
  Filled 2020-05-21 (×7): qty 1

## 2020-05-21 MED ORDER — IOHEXOL 300 MG/ML  SOLN
75.0000 mL | Freq: Once | INTRAMUSCULAR | Status: AC | PRN
  Administered 2020-05-21: 75 mL via INTRAVENOUS

## 2020-05-21 MED ORDER — MOMETASONE FURO-FORMOTEROL FUM 200-5 MCG/ACT IN AERO
2.0000 | INHALATION_SPRAY | Freq: Two times a day (BID) | RESPIRATORY_TRACT | Status: DC
  Filled 2020-05-21: qty 8.8

## 2020-05-21 MED ORDER — IPRATROPIUM-ALBUTEROL 0.5-2.5 (3) MG/3ML IN SOLN
3.0000 mL | Freq: Four times a day (QID) | RESPIRATORY_TRACT | Status: DC | PRN
  Administered 2020-05-22: 3 mL via RESPIRATORY_TRACT
  Filled 2020-05-21: qty 3

## 2020-05-21 MED ORDER — DILTIAZEM HCL 25 MG/5ML IV SOLN
10.0000 mg | Freq: Once | INTRAVENOUS | Status: AC
  Administered 2020-05-21: 10 mg via INTRAVENOUS
  Filled 2020-05-21: qty 5

## 2020-05-21 MED ORDER — SODIUM CHLORIDE (PF) 0.9 % IJ SOLN
INTRAMUSCULAR | Status: AC
  Filled 2020-05-21: qty 50

## 2020-05-21 MED ORDER — RACEPINEPHRINE HCL 2.25 % IN NEBU
0.5000 mL | INHALATION_SOLUTION | Freq: Once | RESPIRATORY_TRACT | Status: AC
  Administered 2020-05-21: 0.5 mL via RESPIRATORY_TRACT
  Filled 2020-05-21: qty 0.5

## 2020-05-21 MED ORDER — ALBUTEROL SULFATE HFA 108 (90 BASE) MCG/ACT IN AERS: 2.0000 | INHALATION_SPRAY | Freq: Four times a day (QID) | RESPIRATORY_TRACT | Status: DC | PRN

## 2020-05-21 MED ORDER — DEXAMETHASONE SODIUM PHOSPHATE 10 MG/ML IJ SOLN
10.0000 mg | Freq: Once | INTRAMUSCULAR | Status: AC
  Administered 2020-05-21: 10 mg via INTRAVENOUS
  Filled 2020-05-21: qty 1

## 2020-05-21 MED ORDER — APIXABAN 2.5 MG PO TABS
2.5000 mg | ORAL_TABLET | Freq: Two times a day (BID) | ORAL | Status: DC
  Administered 2020-05-22: 2.5 mg via ORAL
  Filled 2020-05-21 (×2): qty 1

## 2020-05-21 NOTE — H&P (Signed)
History and Physical   [name] CHY:[id number] DOB: 15-Dec-1940 DOA: 05/21/2020  Referring MD/NP/PA: Dr. Dorie Rank  PCP: Elsie Stain, MD   Outpatient Specialists: None  Patient coming from: Home  Chief Complaint: Stridor  HPI: [name] is a 79 y.o. male with medical history significant of remote alcohol and tobacco abuse, pulmonary fibrosis, COPD, atrial fibrillation among other things who is a cachectic young man presenting to the ER with progressive stridor some shortness of breath.  Patient reported choking on chicken today leading to coughing spells.  He has had these episodes on and off with food.  He is also noted increasing shortness of breath with any stridor.  He was seen in the ER and evaluated.  ENT consulted and immediate evaluation in the ER showed an epiglottic mass.  Patient being admitted to the hospital for initial evaluation and treatment.  His shortness of breath is on and off.  Denied any fever or chills.  He has had some cough but symptoms have been going on gradually for a long period of time.  He used to smoke but quit many years ago.  Also has slowed down on drinking.  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  ED Course: Vital signs largely stable.  White count 7.5 hemoglobin 11.9 and platelets 173.  VBG is entirely within normal limits.  Chest x-ray shows chronic interstitial lung disease with basilar predominant scar and fibrosis, potassium 3.4 otherwise rest of the chemistry within normal.  CT head and neck with contrast ordered and patient being admitted for further evaluation and treatment.  Review of Systems: As per HPI otherwise 10 point review of systems negative.    Past Medical History:  Diagnosis Date  . Alcohol abuse   . CHF (congestive heart failure) ([employer])   . COPD (chronic obstructive pulmonary disease) ([place])   . Seizures ([place])     History reviewed. No pertinent surgical  history.   reports that he has quit smoking. He has never used smokeless tobacco. He reports previous alcohol use of about 6.0 standard drinks of alcohol per week. He reports that he does not use drugs.  No Known Allergies  Family History  Problem Relation Age of Onset  . Hypertension Other      Prior to Admission medications   Medication Sig Start Date End Date Taking? Authorizing Provider  diltiazem (CARDIZEM CD) 120 MG 24 hr capsule Take 1 capsule (120 mg total) by mouth daily. Hold for HR less than 55 or BP less than 100/60 04/26/20   Fenton, Clint R, PA  ELIQUIS 2.5 MG TABS tablet TAKE 1 TABLET BY MOUTH TWICE A DAY. 04/27/20   Elsie Stain, MD  ipratropium-albuterol (DUONEB) 0.5-2.5 (3) MG/3ML SOLN SMARTSIG:1 Via Nebulizer Every 4-6 Hours PRN 03/10/20   [provider]  SYMBICORT 160-4.5 MCG/ACT inhaler INHALE 2 PUFFS INTO THE LUNGS IN THE MORNING AND AT BEDTIME 04/27/20   Elsie Stain, MD  VENTOLIN HFA 108 (90 Base) MCG/ACT inhaler INHALE 2 PUFFS INTO THE LUNGS EVERY 6 HOURS AS NEEDED FOR WHEEZING OR SHORTNESS OF BREATH. 04/27/20   Elsie Stain, MD    Physical Exam: Vitals:   05/21/20 1900 05/21/20 1915 05/21/20 2000 05/21/20 2046  BP:  111/73 (!) 108/53 111/83  Pulse:  64 61 85  Resp:  23 16 14   Temp:  98.5 F (36.9 C)    TempSrc:  Oral    SpO2:  100%  97% 98%  Weight: 50 kg     Height: 5\' 5"  (1.651 m)         Constitutional: Chronically ill looking cachectic Vitals:   05/21/20 1900 05/21/20 1915 05/21/20 2000 05/21/20 2046  BP:  111/73 (!) 108/53 111/83  Pulse:  64 61 85  Resp:  23 16 14   Temp:  98.5 F (36.9 C)    TempSrc:  Oral    SpO2:  100% 97% 98%  Weight: 50 kg     Height: 5\' 5"  (1.651 m)      Eyes: PERRL, lids and conjunctivae normal ENMT: Mucous membranes are moist. Posterior pharynx clear of any exudate or lesions.Normal dentition.  Neck: normal, supple, no masses, no thyromegaly Respiratory: Mild stridor with some expiratory  wheezing and some bilateral crackles. Normal respiratory effort. No accessory muscle use.  Cardiovascular: Regular rate and rhythm, no murmurs / rubs / gallops. No extremity edema. 2+ pedal pulses. No carotid bruits.  Abdomen: no tenderness, no masses palpated. No hepatosplenomegaly. Bowel sounds positive.  Musculoskeletal: no clubbing / cyanosis. No joint deformity upper and lower extremities. Good ROM, no contractures. Normal muscle tone.  Skin: no rashes, lesions, ulcers. No induration Neurologic: CN 2-12 grossly intact. Sensation intact, DTR normal. Strength 5/5 in all 4.  Psychiatric: Normal judgment and insight. Alert and oriented x 3. Normal mood.     Labs on Admission: I have personally reviewed following labs and imaging studies  CBC: Recent Labs  Lab 05/21/20 1858  WBC 7.5  NEUTROABS 5.3  HGB 11.9*  HCT 33.5*  MCV 82.9  PLT 355   Basic Metabolic Panel: Recent Labs  Lab 05/21/20 1858  NA 141  K 3.4*  CL 104  CO2 26  GLUCOSE 102*  BUN 18  CREATININE 1.01  CALCIUM 9.1   GFR: Estimated Creatinine Clearance: 41.3 mL/min (by C-G formula based on SCr of 1.01 mg/dL). Liver Function Tests: No results for input(s): AST, ALT, ALKPHOS, BILITOT, PROT, ALBUMIN in the last 168 hours. No results for input(s): LIPASE, AMYLASE in the last 168 hours. No results for input(s): AMMONIA in the last 168 hours. Coagulation Profile: No results for input(s): INR, PROTIME in the last 168 hours. Cardiac Enzymes: No results for input(s): CKTOTAL, CKMB, CKMBINDEX, TROPONINI in the last 168 hours. BNP (last 3 results) No results for input(s): PROBNP in the last 8760 hours. HbA1C: No results for input(s): HGBA1C in the last 72 hours. CBG: No results for input(s): GLUCAP in the last 168 hours. Lipid Profile: No results for input(s): CHOL, HDL, LDLCALC, TRIG, CHOLHDL, LDLDIRECT in the last 72 hours. Thyroid Function Tests: No results for input(s): TSH, T4TOTAL, FREET4, T3FREE, THYROIDAB  in the last 72 hours. Anemia Panel: No results for input(s): VITAMINB12, FOLATE, FERRITIN, TIBC, IRON, RETICCTPCT in the last 72 hours. Urine analysis:    Component Value Date/Time   COLORURINE YELLOW 07/14/2015 [place] 07/14/2015 1352   LABSPEC 1.015 07/14/2015 1352   PHURINE 7.5 07/14/2015 1352   GLUCOSEU NEGATIVE 07/14/2015 1352   HGBUR NEGATIVE 07/14/2015 1352   BILIRUBINUR NEGATIVE 07/14/2015 1352   KETONESUR NEGATIVE 07/14/2015 1352   PROTEINUR NEGATIVE 07/14/2015 1352   UROBILINOGEN 2.0 (H) 07/14/2015 1352   NITRITE NEGATIVE 07/14/2015 1352   LEUKOCYTESUR NEGATIVE 07/14/2015 1352   Sepsis Labs: @LABRCNTIP (procalcitonin:4,lacticidven:4) ) Recent Results (from the past 240 hour(s))  SARS Coronavirus 2 by RT PCR (hospital order, performed in [facility] hospital lab) Nasopharyngeal Nasopharyngeal Swab     Status: None  Collection Time: 05/21/20  7:19 PM   Specimen: Nasopharyngeal Swab  Result Value Ref Range Status   SARS Coronavirus 2 NEGATIVE NEGATIVE Final    Comment: (NOTE) SARS-CoV-2 target nucleic acids are NOT DETECTED.  The SARS-CoV-2 RNA is generally detectable in upper and lower respiratory specimens during the acute phase of infection. The lowest concentration of SARS-CoV-2 viral copies this assay can detect is 250 copies / mL. A negative result does not preclude SARS-CoV-2 infection and should not be used as the sole basis for treatment or other patient management decisions.  A negative result may occur with improper specimen collection / handling, submission of specimen other than nasopharyngeal swab, presence of viral mutation(s) within the areas targeted by this assay, and inadequate number of viral copies (<250 copies / mL). A negative result must be combined with clinical observations, patient history, and epidemiological information.  Fact Sheet for Patients:   StrictlyIdeas.no  Fact Sheet for Healthcare  Providers: BankingDealers.co.za  This test is not yet approved or  cleared by the [place] FDA and has been authorized for detection and/or diagnosis of SARS-CoV-2 by FDA under an Emergency Use Authorization (EUA).  This EUA will remain in effect (meaning this test can be used) for the duration of the COVID-19 declaration under Section 564(b)(1) of the Act, 21 U.S.C. section 360bbb-3(b)(1), unless the authorization is terminated or revoked sooner.  Performed at [facility], [place] [address] [place], [place] [address]      Radiological Exams on Admission: DG Chest Port 1 View  Result Date: 05/21/2020 CLINICAL DATA:  Difficulty breathing, COPD, CHF EXAM: PORTABLE CHEST 1 VIEW COMPARISON:  03/04/2020, 03/25/2020 FINDINGS: Single frontal view of the chest demonstrates a stable cardiac silhouette. There are chronic bilateral areas of scarring, with fibrosis greatest at the lung bases left greater than right. No acute airspace disease. No effusion or pneumothorax. No acute bony abnormalities. IMPRESSION: 1. 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 No acute intrathoracic process. Electronically Signed   By: Randa Ngo M.D.   On: 05/21/2020 19:21      Assessment/Plan Principal Problem:   SOB (shortness of breath) Active Problems:   Atrial fibrillation (HCC)   IPF (idiopathic pulmonary fibrosis) (HCC)   Stridor   Benign tumor of epiglottis (suprahyoid portion)     #1 shortness of breath with stridor: Secondary to epiglottic tumor.  Patient already seen by ENT and will be followed by ENT in the hospital.  Will admit the patient.  Placed on soft diet.  Oxygen as needed.  May consider steroids to decrease inflammation.  ENT will follow and recommendations will be implemented  #2 hypertension: Blood pressure is normal at this point.  Continue all home regimen  #3 hypokalemia: Replete potassium  #4  interstitial pulmonary fibrosis: Oxygen as needed.  Supportive care   #5 atrial fibrillation: Continue home regimen and monitor   DVT prophylaxis: Eliquis Code Status: Full code Family Communication: No family at bedside Disposition Plan: To be determined Consults called: Dr. Janace Hoard, ENT Admission status: Inpatient  Severity of Illness: The appropriate patient status for this patient is INPATIENT. Inpatient status is judged to be reasonable and necessary in order to provide the required intensity of service to ensure the patient's safety. The patient's presenting symptoms, physical exam findings, and initial radiographic and laboratory data in the context of their chronic comorbidities is felt to place them at high risk for further clinical deterioration. Furthermore, it is not anticipated that the  patient will be medically stable for discharge from the hospital within 2 midnights of admission. The following factors support the patient status of inpatient.   " The patient's presenting symptoms include stridor. " The worrisome physical exam findings include upper airway wheezing. " The initial radiographic and laboratory data are worrisome because of awaiting CT head and neck with contrast. " The chronic co-morbidities include atrial fibrillation.   * I certify that at the point of admission it is my clinical judgment that the patient will require inpatient hospital care spanning beyond 2 midnights from the point of admission due to high intensity of service, high risk for further deterioration and high frequency of surveillance required.Barbette Merino MD [facility] Pager [phone]  If 7PM-7AM, please contact night-coverage [email or web address] Password [facility]  05/21/2020, 9:14 PM

## 2020-05-21 NOTE — ED Triage Notes (Signed)
Pt BIB EMS from [facility]. Facility called out for difficulty breathing. Hx of COPD and CHF. Pt was tripoding on arrival. Pt received neb treatment at facility and albuterol with EMS and sounds a little better. A&O x4.  93% RA BP 123/78 RR 22 HR 120 after albuterol CBG 88 20G LAC

## 2020-05-21 NOTE — Consult Note (Signed)
Reason for Consult:stridor Referring Physician: er  [name] is an 79 y.o. male.  HPI: History of stridor.  The patient has had stridor for about 3 weeks at least.  He does not know for sure.  This morning he ate some chicken and coughed on it.  His stridor increased slightly.  He has had no respiratory distress at any point with this stridorous breathing.  It does fluctuate but not present sometimes.  He is able to swallow.  He has had a little bit of discomfort intermittently.  He is certain with the chicken this morning that he coughed the whole piece out.  Past Medical History:  Diagnosis Date  . Alcohol abuse   . CHF (congestive heart failure) ([place])   . COPD (chronic obstructive pulmonary disease) ([place])   . Seizures ([place])     History reviewed. No pertinent surgical history.  Family History  Problem Relation Age of Onset  . Hypertension Other     Social History:  reports that he has quit smoking. He has never used smokeless tobacco. He reports previous alcohol use of about 6.0 standard drinks of alcohol per week. He reports that he does not use drugs.  Allergies: No Known Allergies  Medications: I have reviewed the patient's current medications.  Results for orders placed or performed during the hospital encounter of 05/21/20 (from the past 48 hour(s))  Basic metabolic panel     Status: Abnormal   Collection Time: 05/21/20  6:58 PM  Result Value Ref Range   Sodium 141 135 - 145 mmol/L   Potassium 3.4 (L) 3.5 - 5.1 mmol/L   Chloride 104 98 - 111 mmol/L   CO2 26 22 - 32 mmol/L   Glucose, Bld 102 (H) 70 - 99 mg/dL    Comment: Glucose reference range applies only to samples taken after fasting for at least 8 hours.   BUN 18 8 - 23 mg/dL   Creatinine, Ser 1.01 0.61 - 1.24 mg/dL   Calcium 9.1 8.9 - 10.3 mg/dL   GFR calc non Af Amer >60 >60 mL/min   GFR calc Af Amer >60 >60 mL/min   Anion gap 11 5 - 15    Comment: Performed at [facility], [place]  [address], [place], [place] [address]  Brain natriuretic peptide     Status: Abnormal   Collection Time: 05/21/20  6:58 PM  Result Value Ref Range   B Natriuretic Peptide 284.5 (H) 0.0 - 100.0 pg/mL    Comment: Performed at [facility], [place] [address], [place], [place] [address]  CBC with Differential     Status: Abnormal   Collection Time: 05/21/20  6:58 PM  Result Value Ref Range   WBC 7.5 4.0 - 10.5 K/uL   RBC 4.04 (L) 4.22 - 5.81 MIL/uL   Hemoglobin 11.9 (L) 13.0 - 17.0 g/dL   HCT 33.5 (L) 39 - 52 %   MCV 82.9 80.0 - 100.0 fL   MCH 29.5 26.0 - 34.0 pg   MCHC 35.5 30.0 - 36.0 g/dL   RDW 13.5 11.5 - 15.5 %   Platelets 173 150 - 400 K/uL   nRBC 0.0 0.0 - 0.2 %   Neutrophils Relative % 70 %   Neutro Abs 5.3 1.7 - 7.7 K/uL   Lymphocytes Relative 15 %   Lymphs Abs 1.1 0.7 - 4.0 K/uL   Monocytes Relative 10 %   Monocytes Absolute 0.8 0 - 1 K/uL   Eosinophils Relative 4 %  Eosinophils Absolute 0.3 0 - 0 K/uL   Basophils Relative 1 %   Basophils Absolute 0.1 0 - 0 K/uL   Immature Granulocytes 0 %   Abs Immature Granulocytes 0.02 0.00 - 0.07 K/uL    Comment: Performed at [facility], [place] [address] [place], [place] [address]  Blood gas, venous     Status: None   Collection Time: 05/21/20  7:30 PM  Result Value Ref Range   pH, Ven 7.358 7.25 - 7.43   pCO2, Ven 46.1 44 - 60 mmHg   pO2, Ven 44.4 32 - 45 mmHg   Bicarbonate 25.3 20.0 - 28.0 mmol/L   Acid-Base Excess 0.1 0.0 - 2.0 mmol/L   O2 Saturation 74.4 %   Patient temperature 98.6     Comment: Performed at [facility], [place] [address] [place], [place] [address]    DG Chest Port 1 View  Result Date: 05/21/2020 CLINICAL DATA:  Difficulty breathing, COPD, CHF EXAM: PORTABLE CHEST 1 VIEW COMPARISON:  03/04/2020, 03/25/2020 FINDINGS: Single frontal view of the chest demonstrates a stable cardiac silhouette. There are chronic bilateral areas of scarring, with fibrosis  greatest at the lung bases left greater than right. No acute airspace disease. No effusion or pneumothorax. No acute bony abnormalities. IMPRESSION: 1. 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 No acute intrathoracic process. Electronically Signed   By: Randa Ngo M.D.   On: 05/21/2020 19:21    Review of Systems Blood pressure (!) 108/53, pulse 61, temperature 98.5 F (36.9 C), temperature source Oral, resp. rate 16, height 5\' 5"  (1.651 m), weight 50 kg, SpO2 97 %. Physical Exam HENT:     Head:     Comments: He is cachectic.  He has obvious stridor that does not seem to cause him distress.  He is comfortable laying in bed but has a inspiratory stridor.  His voice is slightly muffled.  Fiberoptic exam-his nasopharynx is clear.  His epiglottis looks normal as well as base of tongue.  The right area epiglottic fold is deformed and there is a tumor that extends down to the arytenoid.  The tumor extends past midline covering the right vocal cord.  The left vocal cord can be seen from a lateral right view and it looks like it moves well.  There looks like there is an adequate airway.    Nose: Nose normal.     Mouth/Throat:     Mouth: Mucous membranes are moist.  Eyes:     Extraocular Movements: Extraocular movements intact.     Pupils: Pupils are equal, round, and reactive to light.  Musculoskeletal:     Cervical back: Normal range of motion and neck supple.  Neurological:     Mental Status: He is alert.     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  He is actively stridorous but it fluctuates even while I was in the room.  The tumor does cover over the right side of the glottis and I cannot assess whether the right vocal cord moves.  Racemic epinephrine and steroids would be appropriate and admission by the hospitals.  He will need a consult from the practicing otolaryngology in town on  Monday.  He will need a direct laryngoscopy and biopsy for work-up and possibly will need a tracheotomy to facilitate the biopsy and also protect his airway right now he is stable and does not need any acute  intervention.  A CT scan with contrast would be helpful of the neck.Melissa Montane 05/21/2020, 8:39 PM

## 2020-05-21 NOTE — ED Provider Notes (Signed)
[facility] DEPT Provider Note   CSN: [id number] Arrival date & time: 05/21/20  1851     History Chief Complaint  Patient presents with  . Shortness of Breath    [name] is a 79 y.o. male.  The history is provided by the patient and medical records. No language interpreter was used.  Shortness of Breath    79 year old male with history of COPD, CHF, atrial fibrillation on eliquis, alcohol abuse brought here via EMS from [facility] for evaluation of shortness of breath.  Per EMS note, facility called due to patient having difficulty eating.  Patient was found to be tripoding on arrival and patient received nebulizing treatment at the facility and additional breathing treatment by EMS with improvement.  Patient is having trouble breathing, history is limited.  He did mention developing acute onset of shortness of breath while eating dinner.  He may have been choking.  I was unable to obtain additional history from patient.  Level 5 caveat is due to shortness of breath.  Past Medical History:  Diagnosis Date  . Alcohol abuse   . Seizures [facility])     Patient Active Problem List   Diagnosis Date Noted  . IPF (idiopathic pulmonary fibrosis) ([place]) 03/18/2020  . Weight loss 03/18/2020  . Atrial fibrillation ([place]) 03/10/2020  . Secondary hypercoagulable state ([place]) 03/10/2020    No past surgical history on file.     Family History  Problem Relation Age of Onset  . Hypertension Other     Social History   Tobacco Use  . Smoking status: Former Research scientist (life sciences)  . Smokeless tobacco: Never Used  Substance Use Topics  . Alcohol use: Not Currently    Alcohol/week: 6.0 standard drinks    Types: 6 Cans of beer per week  . Drug use: No    Home Medications Prior to Admission medications   Medication Sig Start Date End Date Taking? Authorizing Provider  diltiazem (CARDIZEM CD) 120 MG 24 hr capsule Take 1 capsule (120 mg total) by mouth daily. Hold  for HR less than 55 or BP less than 100/60 04/26/20   Fenton, Clint R, PA  ELIQUIS 2.5 MG TABS tablet TAKE 1 TABLET BY MOUTH TWICE A DAY. 04/27/20   Elsie Stain, MD  ipratropium-albuterol (DUONEB) 0.5-2.5 (3) MG/3ML SOLN SMARTSIG:1 Via Nebulizer Every 4-6 Hours PRN 03/10/20   [provider]  SYMBICORT 160-4.5 MCG/ACT inhaler INHALE 2 PUFFS INTO THE LUNGS IN THE MORNING AND AT BEDTIME 04/27/20   Elsie Stain, MD  VENTOLIN HFA 108 (90 Base) MCG/ACT inhaler INHALE 2 PUFFS INTO THE LUNGS EVERY 6 HOURS AS NEEDED FOR WHEEZING OR SHORTNESS OF BREATH. 04/27/20   Elsie Stain, MD    Allergies    Patient has no known allergies.  Review of Systems   Review of Systems  Unable to perform ROS: Severe respiratory distress  Respiratory: Positive for shortness of breath.     Physical Exam Updated Vital Signs BP 111/73   Pulse 64   Temp 98.5 F (36.9 C) (Oral)   Resp 23   Ht 5\' 5"  (1.651 m)   Wt 50 kg   SpO2 100%   BMI 18.34 kg/m   Physical Exam Vitals and nursing note reviewed.  Constitutional:      Comments: Elderly cachectic male sitting in bed appears stridorous and uncomfortable.  HENT:     Head: Atraumatic.     Mouth/Throat:     Mouth: Mucous membranes are moist.  Pharynx: No pharyngeal swelling.  Eyes:     Conjunctiva/sclera: Conjunctivae normal.  Cardiovascular:     Rate and Rhythm: Tachycardia present.  Pulmonary:     Effort: Accessory muscle usage present.     Breath sounds: Stridor present. Rhonchi present. No decreased breath sounds, wheezing or rales.  Chest:     Chest wall: No tenderness.  Abdominal:     Palpations: Abdomen is soft.     Tenderness: There is no abdominal tenderness.  Musculoskeletal:     Cervical back: Neck supple.     Right lower leg: No edema.     Left lower leg: No edema.  Skin:    Findings: No rash.  Neurological:     Mental Status: He is alert.     ED Results / Procedures / Treatments   Labs (all labs ordered are  listed, but only abnormal results are displayed) Labs Reviewed  BASIC METABOLIC PANEL - Abnormal; Notable for the following components:      Result Value   Potassium 3.4 (*)    Glucose, Bld 102 (*)    All other components within normal limits  BRAIN NATRIURETIC PEPTIDE - Abnormal; Notable for the following components:   B Natriuretic Peptide 284.5 (*)    All other components within normal limits  CBC WITH DIFFERENTIAL/PLATELET - Abnormal; Notable for the following components:   RBC 4.04 (*)    Hemoglobin 11.9 (*)    HCT 33.5 (*)    All other components within normal limits  SARS CORONAVIRUS 2 BY RT PCR (HOSPITAL ORDER, [facility])  BLOOD GAS, VENOUS    EKG None ED ECG REPORT   Date: 05/21/2020  Rate: 110  Rhythm: atrial fibrillation  QRS Axis: left  Intervals: normal  ST/T Wave abnormalities: normal  Conduction Disutrbances:none  Narrative Interpretation: PVC  Old EKG Reviewed: unchanged  I have personally reviewed the EKG tracing and agree with the computerized printout as noted.   Radiology DG Chest Port 1 View  Result Date: 05/21/2020 CLINICAL DATA:  Difficulty breathing, COPD, CHF EXAM: PORTABLE CHEST 1 VIEW COMPARISON:  03/04/2020, 03/25/2020 FINDINGS: Single frontal view of the chest demonstrates a stable cardiac silhouette. There are chronic bilateral areas of scarring, with fibrosis greatest at the lung bases left greater than right. No acute airspace disease. No effusion or pneumothorax. No acute bony abnormalities. IMPRESSION: 1. 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 No acute intrathoracic process. Electronically Signed   By: Randa Ngo M.D.   On: 05/21/2020 19:21    Procedures .Critical Care Performed by: Domenic Moras, PA-C Authorized by: Domenic Moras, PA-C   Critical care provider statement:    Critical care time (minutes):  30   Critical care was time spent personally by me on the  following activities:  Discussions with consultants, evaluation of patient's response to treatment, examination of patient, ordering and performing treatments and interventions, ordering and review of laboratory studies, ordering and review of radiographic studies, pulse oximetry, re-evaluation of patient's condition, obtaining history from patient or surrogate and review of old charts   (including critical care time)  Medications Ordered in ED Medications  diltiazem (CARDIZEM) injection 10 mg (10 mg Intravenous Given 05/21/20 2005)  Racepinephrine HCl 2.25 % nebulizer solution 0.5 mL (0.5 mLs Nebulization Given 05/21/20 2049)  dexamethasone (DECADRON) injection 10 mg (10 mg Intravenous Given 05/21/20 2049)    ED Course  I have reviewed the triage vital signs and the nursing notes.  Pertinent labs & imaging results that were available during my care of the patient were reviewed by me and considered in my medical decision making (see chart for details).    MDM Rules/Calculators/A&P                          BP 111/73   Pulse 64   Temp 98.5 F (36.9 C) (Oral)   Resp 23   Ht 5\' 5"  (1.651 m)   Wt 50 kg   SpO2 100%   BMI 18.34 kg/m   Final Clinical Impression(s) / ED Diagnoses Final diagnoses:  Mass in neck  Stridor  Atrial fibrillation with RVR ([place])    Rx / DC Orders ED Discharge Orders    None     7:32 PM Elderly male with history of CHF and COPD here with acute onset of shortness of breath while eating dinner.  He report he may have choked.  He was reportedly tripoding initially but after receiving several breathing treatment his symptoms seems to be improved.  On exam, patient is having stridor. Very difficult to understand due to poor phonation by pt.  O2 sats 96% on room air.  Initial chest x-ray shows chronic interstitial lung disease without acute changes.  I am concerned for potential aspiration causing his shortness of breath.  Will consult ENT.  Patient also has  history of atrial fibrillation, his heart rate increases to 120-130s.  He did receive albuterol prior to arrival which may contribute to his tachycardia however in the setting of atrial fibrillation with RVR, will give 10 mg of diltiazem.  Care discussed with Dr. Tomi Bamberger.  7:54 PM Due to concern of aspiration causing stridor, I have consulted ENT specialist DR. Janace Hoard who agrees to see pt and will help with disposition.    8:15 PM Heart rate is improved after 10 mg of diltiazem.  Current heart rate is 80.  Oxygenation is at 96% on room air.  Patient currently awaits evaluation from ENT specialist.  8:39 PM ENT specialist Dr. Janace Hoard has seen and scoped pt and discovered a fungating mass in his R epiglottic fold and arytenoid.  Pt has had experiencing stridor for the past several weeks.  He recommend admission to [facility] for further work up and for ENT to continue managing this pt.   8:52 PM Appreciate consultation from [facility] Dr. Jonelle Sidle who agrees to see and admit pt to [facility] for further work up. Pt is aware of plan.   Hong Moring was evaluated in Emergency Department on 05/21/2020 for the symptoms described in the history of present illness. He was evaluated in the context of the global COVID-19 pandemic, which necessitated consideration that the patient might be at risk for infection with the SARS-CoV-2 virus that causes COVID-19. Institutional protocols and algorithms that pertain to the evaluation of patients at risk for COVID-19 are in a state of rapid change based on information released by regulatory bodies including the CDC and federal and state organizations. These policies and algorithms were followed during the patient's care in the ED.    Domenic Moras, PA-C 05/21/20 2055    Dorie Rank, MD 05/22/20 [phone]

## 2020-05-22 DIAGNOSIS — I4821 Permanent atrial fibrillation: Secondary | ICD-10-CM | POA: Diagnosis not present

## 2020-05-22 DIAGNOSIS — R0602 Shortness of breath: Secondary | ICD-10-CM

## 2020-05-22 DIAGNOSIS — J387 Other diseases of larynx: Secondary | ICD-10-CM | POA: Diagnosis present

## 2020-05-22 DIAGNOSIS — R221 Localized swelling, mass and lump, neck: Secondary | ICD-10-CM

## 2020-05-22 DIAGNOSIS — R061 Stridor: Secondary | ICD-10-CM | POA: Diagnosis not present

## 2020-05-22 LAB — COMPREHENSIVE METABOLIC PANEL
ALT: 10 U/L (ref 0–44)
AST: 14 U/L — ABNORMAL LOW (ref 15–41)
Albumin: 3.9 g/dL (ref 3.5–5.0)
Alkaline Phosphatase: 55 U/L (ref 38–126)
Anion gap: 10 (ref 5–15)
BUN: 16 mg/dL (ref 8–23)
CO2: 25 mmol/L (ref 22–32)
Calcium: 9.1 mg/dL (ref 8.9–10.3)
Chloride: 103 mmol/L (ref 98–111)
Creatinine, Ser: 0.83 mg/dL (ref 0.61–1.24)
GFR calc Af Amer: >60 mL/min (ref >60)
GFR calc non Af Amer: >60 mL/min (ref >60)
Glucose, Bld: 147 mg/dL — ABNORMAL HIGH (ref 70–99)
Potassium: 4 mmol/L (ref 3.5–5.1)
Sodium: 138 mmol/L (ref 135–145)
Total Bilirubin: 1 mg/dL (ref 0.3–1.2)
Total Protein: 7.2 g/dL (ref 6.5–8.1)

## 2020-05-22 LAB — CBC
HCT: 34.6 % — ABNORMAL LOW (ref 39.0–52.0)
Hemoglobin: 12.2 g/dL — ABNORMAL LOW (ref 13.0–17.0)
MCH: 29 pg (ref 26.0–34.0)
MCHC: 35.3 g/dL (ref 30.0–36.0)
MCV: 82.4 fL (ref 80.0–100.0)
Platelets: 164 10*3/uL (ref 150–400)
RBC: 4.2 MIL/uL — ABNORMAL LOW (ref 4.22–5.81)
RDW: 13.6 % (ref 11.5–15.5)
WBC: 5.4 10*3/uL (ref 4.0–10.5)
nRBC: 0 % (ref 0.0–0.2)

## 2020-05-22 LAB — MRSA PCR SCREENING: MRSA by PCR: NEGATIVE

## 2020-05-22 MED ORDER — CHLORHEXIDINE GLUCONATE CLOTH 2 % EX PADS
6.0000 | MEDICATED_PAD | Freq: Every day | CUTANEOUS | Status: DC
  Administered 2020-05-22 – 2020-05-29 (×8): 6 via TOPICAL

## 2020-05-22 MED ORDER — ORAL CARE MOUTH RINSE
15.0000 mL | Freq: Two times a day (BID) | OROMUCOSAL | Status: DC
  Administered 2020-05-22 – 2020-05-24 (×4): 15 mL via OROMUCOSAL

## 2020-05-22 MED ORDER — ONDANSETRON HCL 4 MG PO TABS: 4.0000 mg | ORAL_TABLET | Freq: Four times a day (QID) | ORAL | Status: DC | PRN

## 2020-05-22 MED ORDER — CHLORHEXIDINE GLUCONATE 0.12 % MT SOLN
15.0000 mL | Freq: Two times a day (BID) | OROMUCOSAL | Status: DC
  Administered 2020-05-22 – 2020-05-24 (×4): 15 mL via OROMUCOSAL
  Filled 2020-05-22: qty 15

## 2020-05-22 MED ORDER — ACETAMINOPHEN 650 MG RE SUPP: 650.0000 mg | Freq: Four times a day (QID) | RECTAL | Status: DC | PRN

## 2020-05-22 MED ORDER — ENOXAPARIN SODIUM 40 MG/0.4ML ~~LOC~~ SOLN: 40.0000 mg | SUBCUTANEOUS | Status: DC

## 2020-05-22 MED ORDER — METHYLPREDNISOLONE SODIUM SUCC 40 MG IJ SOLR
40.0000 mg | Freq: Two times a day (BID) | INTRAMUSCULAR | Status: DC
  Administered 2020-05-22 – 2020-05-25 (×7): 40 mg via INTRAVENOUS
  Filled 2020-05-22 (×7): qty 1

## 2020-05-22 MED ORDER — DEXAMETHASONE SODIUM PHOSPHATE 10 MG/ML IJ SOLN
10.0000 mg | Freq: Once | INTRAMUSCULAR | Status: AC
  Administered 2020-05-22: 10 mg via INTRAVENOUS
  Filled 2020-05-22: qty 1

## 2020-05-22 MED ORDER — ACETAMINOPHEN 325 MG PO TABS
650.0000 mg | ORAL_TABLET | Freq: Four times a day (QID) | ORAL | Status: DC | PRN
  Administered 2020-05-26 – 2020-05-31 (×5): 650 mg via ORAL
  Filled 2020-05-22 (×5): qty 2

## 2020-05-22 MED ORDER — ONDANSETRON HCL 4 MG/2ML IJ SOLN
4.0000 mg | Freq: Four times a day (QID) | INTRAMUSCULAR | Status: DC | PRN
  Administered 2020-05-30: 4 mg via INTRAVENOUS
  Filled 2020-05-22: qty 2

## 2020-05-22 MED ORDER — DEXTROSE-NACL 5-0.9 % IV SOLN: INTRAVENOUS | Status: DC

## 2020-05-22 NOTE — Progress Notes (Signed)
Initial Nutrition Assessment  DOCUMENTATION CODES:   Underweight  INTERVENTION:  Monitor for diet advancement vs need for nutrition support.  NUTRITION DIAGNOSIS:   Inadequate oral intake related to inability to eat as evidenced by NPO status.    GOAL:   Patient will meet greater than or equal to 90% of their needs    MONITOR:   Diet advancement, Weight trends, Labs, I & O's  REASON FOR ASSESSMENT:   Malnutrition Screening Tool    ASSESSMENT:   Pt admitted with stridor, shortness of breath and complaints of choking on food. Pt found to have R laryngeal mass. ENT with plans for tracheostomy. PMH includes pulmonary fibrosis, CHF, COPD, Afib, EtOH abuse.  Pt unavailable at time of RD visit.   PO Intake: 0% x 1 recorded meal  Per wt readings, pt with a 13.6% wt loss x2 months, which is significant for time frame.  Suspect pt is malnourished; however, unable to diagnose at this time without detailed diet history and/or nutrition-focused physical exam.   Labs reviewed. Medications: Solu-medrol IVF: D5-1/2 NS @ 132ml/hr  NUTRITION - FOCUSED PHYSICAL EXAM:  Unable to perform at this time, will attempt at follow-up.   Diet Order:   Diet Order            Diet NPO time specified  Diet effective now                 EDUCATION NEEDS:   Not appropriate for education at this time  Skin:  Skin Assessment: Reviewed RN Assessment  Last BM:  PTA  Height:   Ht Readings from Last 1 Encounters:  05/21/20 5\' 5"  (1.651 m)    Weight:   Wt Readings from Last 10 Encounters:  05/21/20 50 kg  05/10/20 50 kg  04/26/20 52.4 kg  03/18/20 57.6 kg  03/10/20 58 kg  07/10/16 63.9 kg    BMI:  Body mass index is 18.34 kg/m.  Estimated Nutritional Needs:   Kcal:  1700-1900  Protein:  85-95 grams  Fluid:  >/= 1.7L/d    Larkin Ina, MS, RD, LDN RD pager number and weekend/on-call pager number located in [place].

## 2020-05-22 NOTE — Plan of Care (Signed)

## 2020-05-22 NOTE — Consult Note (Signed)
NAME:  [name], MRN:  [id number], DOB:  1941-10-19, LOS: 1 ADMISSION DATE:  05/21/2020, CONSULTATION DATE: 05/22/2020 REFERRING MD: Curly Rim MD, CHIEF COMPLAINT:  Stridor, laryngeal tumor  Brief History   79 year old with pulmonary fibrosis, COPD, A. fib presenting with stridor, choking with laryngeal mass.  He has been seen by ENT with plans for tracheostomy later today.  PCCM consulted for help with management.  Past Medical History    has a past medical history of Alcohol abuse, CHF (congestive heart failure) ([place]), COPD (chronic obstructive pulmonary disease) ([place]), and Seizures ([place]).  Significant Hospital Events   7/24 Admit  Consults:  ENT, PCCM  Procedures:    Significant Diagnostic Tests:  CT neck 05/21/20-right supraglottic mass.  Micro Data:    Antimicrobials:    Interim history/subjective:    Objective   Blood pressure 110/81, pulse 87, temperature 97.6 F (36.4 C), resp. rate 17, height 5\' 5"  (1.651 m), weight 50 kg, SpO2 97 %.       No intake or output data in the 24 hours ending 05/22/20 1112 Filed Weights   05/21/20 1900  Weight: 50 kg    Examination: Gen:      Frail, elderly HEENT:  EOMI, sclera anicteric,  Neck:     No masses; no thyromegaly Lungs:    Upper airway stridor CV:         Regular rate and rhythm; no murmurs Abd:      + bowel sounds; soft, non-tender; no palpable masses, no distension Ext:    No edema; adequate peripheral perfusion Skin:      Warm and dry; no rash Neuro: alert and oriented x 3 Psych: normal mood and affect  Resolved Hospital Problem list     Assessment & Plan:  Stridor, right laryngeal tumor Schedule for tracheostomy by ENT later today Add solumedrol to reduce inflammation. PCCM will follow for any postoperative needs  Chronic respiratory failure, pulmonary fibrosis Continue supplemental oxygen, supportive care Follows with Dr. Joya Gaskins.  Atrial fibrillation Rate control with Cardizem Hold  Eliquis for surgery.  May need bridging with heparin drip if surgery is delayed.  Best practice:  Diet: N.p.o. Pain/Anxiety/Delirium protocol (if indicated):NA VAP protocol (if indicated): NA DVT prophylaxis: Eliquis GI prophylaxis: NA Glucose control: Monitor Mobility: Bed Code Status: Full Family Communication: Per primary team Disposition:   Labs   CBC: Recent Labs  Lab 05/21/20 1858 05/22/20 0431  WBC 7.5 5.4  NEUTROABS 5.3  --   HGB 11.9* 12.2*  HCT 33.5* 34.6*  MCV 82.9 82.4  PLT 173 854    Basic Metabolic Panel: Recent Labs  Lab 05/21/20 1858 05/22/20 0431  NA 141 138  K 3.4* 4.0  CL 104 103  CO2 26 25  GLUCOSE 102* 147*  BUN 18 16  CREATININE 1.01 0.83  CALCIUM 9.1 9.1   GFR: Estimated Creatinine Clearance: 50.2 mL/min (by C-G formula based on SCr of 0.83 mg/dL). Recent Labs  Lab 05/21/20 1858 05/22/20 0431  WBC 7.5 5.4    Liver Function Tests: Recent Labs  Lab 05/22/20 0431  AST 14*  ALT 10  ALKPHOS 55  BILITOT 1.0  PROT 7.2  ALBUMIN 3.9   No results for input(s): LIPASE, AMYLASE in the last 168 hours. No results for input(s): AMMONIA in the last 168 hours.  ABG    Component Value Date/Time   HCO3 25.3 05/21/2020 1930   TCO2 23 08/21/2008 0922   O2SAT 74.4 05/21/2020 1930     Coagulation  Profile: No results for input(s): INR, PROTIME in the last 168 hours.  Cardiac Enzymes: No results for input(s): CKTOTAL, CKMB, CKMBINDEX, TROPONINI in the last 168 hours.  HbA1C: No results found for: HGBA1C  CBG: No results for input(s): GLUCAP in the last 168 hours.  Review of Systems:   REVIEW OF SYSTEMS:   All negative; except for those that are bolded, which indicate positives.  Constitutional: weight loss, weight gain, night sweats, fevers, chills, fatigue, weakness.  HEENT: headaches, sore throat, sneezing, nasal congestion, post nasal drip, difficulty swallowing, tooth/dental problems, visual complaints, visual changes, ear  aches. Neuro: difficulty with speech, weakness, numbness, ataxia. CV:  chest pain, orthopnea, PND, swelling in lower extremities, dizziness, palpitations, syncope.  Resp: cough, hemoptysis, dyspnea, wheezing. GI: heartburn, indigestion, abdominal pain, nausea, vomiting, diarrhea, constipation, change in bowel habits, loss of appetite, hematemesis, melena, hematochezia.  GU: dysuria, change in color of urine, urgency or frequency, flank pain, hematuria. MSK: joint pain or swelling, decreased range of motion. Psych: change in mood or affect, depression, anxiety, suicidal ideations, homicidal ideations. Skin: rash, itching, bruising.  Past Medical History  He,  has a past medical history of Alcohol abuse, CHF (congestive heart failure) ([place]), COPD (chronic obstructive pulmonary disease) ([place]), and Seizures ([place]).   Surgical History   History reviewed. No pertinent surgical history.   Social History   reports that he has quit smoking. He has never used smokeless tobacco. He reports previous alcohol use of about 6.0 standard drinks of alcohol per week. He reports that he does not use drugs.   Family History   His family history includes Hypertension in an other family member.   Allergies No Known Allergies   Home Medications  Prior to Admission medications   Medication Sig Start Date End Date Taking? Authorizing Provider  diltiazem (CARDIZEM CD) 120 MG 24 hr capsule Take 1 capsule (120 mg total) by mouth daily. Hold for HR less than 55 or BP less than 100/60 04/26/20  Yes Fenton, Clint R, PA  ELIQUIS 2.5 MG TABS tablet TAKE 1 TABLET BY MOUTH TWICE A DAY. 04/27/20  Yes Elsie Stain, MD  ipratropium-albuterol (DUONEB) 0.5-2.5 (3) MG/3ML SOLN Take 3 mLs by nebulization every 6 (six) hours as needed (sob/wheezing).  03/10/20  Yes [provider]  SYMBICORT 160-4.5 MCG/ACT inhaler INHALE 2 PUFFS INTO THE LUNGS IN THE MORNING AND AT BEDTIME 04/27/20  Yes Elsie Stain, MD  VENTOLIN  HFA 108 (90 Base) MCG/ACT inhaler INHALE 2 PUFFS INTO THE LUNGS EVERY 6 HOURS AS NEEDED FOR WHEEZING OR SHORTNESS OF BREATH. Patient taking differently: Inhale 2 puffs into the lungs every 6 (six) hours as needed.  04/27/20  Yes Elsie Stain, MD     Critical care time: NA   Marshell Garfinkel MD [facility] Please see [email or web address] for pager details.  05/22/2020, 11:44 AM

## 2020-05-22 NOTE — Progress Notes (Signed)
Subjective/Chief Complaint: Still stridorous   Objective: Vital signs in last 24 hours: Temp:  [97.6 F (36.4 C)-98.5 F (36.9 C)] 97.6 F (36.4 C) (07/24 7106) Pulse Rate:  [61-87] 87 (07/24 0614) Resp:  [14-23] 17 (07/24 0614) BP: (98-151)/(53-86) 110/81 (07/24 0614) SpO2:  [96 %-100 %] 97 % (07/24 0614) Weight:  [50 kg] 50 kg (07/23 1900)    Intake/Output from previous day: No intake/output data recorded. Intake/Output this shift: No intake/output data recorded.  ins and exp stridor. he is still confortable and not distressed  Lab Results:  Recent Labs    05/21/20 1858 05/22/20 0431  WBC 7.5 5.4  HGB 11.9* 12.2*  HCT 33.5* 34.6*  PLT 173 164   BMET Recent Labs    05/21/20 1858 05/22/20 0431  NA 141 138  K 3.4* 4.0  CL 104 103  CO2 26 25  GLUCOSE 102* 147*  BUN 18 16  CREATININE 1.01 0.83  CALCIUM 9.1 9.1   PT/INR No results for input(s): LABPROT, INR in the last 72 hours. ABG Recent Labs    05/21/20 1930  HCO3 25.3    Studies/Results: CT Soft Tissue Neck W Contrast  Result Date: 05/21/2020 CLINICAL DATA:  Right laryngeal mass.  Stridor. EXAM: CT NECK WITH CONTRAST TECHNIQUE: Multidetector CT imaging of the neck was performed using the standard protocol following the bolus administration of intravenous contrast. CONTRAST:  46mL OMNIPAQUE IOHEXOL 300 MG/ML  SOLN COMPARISON:  CT chest 07/10/2016 and 03/25/2020 FINDINGS: Pharynx and larynx: Nasopharynx is clear. Soft palate is within normal limits. Tongue base and palatine tonsils are normal. The epiglottis is within normal limits. Soft tissue tumor fills the right area epiglottic fold. Tumor measures 3.3 x 3.7 x 3.0 cm. Tumor crosses midline posteriorly. Tumor extends at least 5 mm below the right vocal cord. Lower trachea is within normal limits. Salivary glands: Submandibular and parotid glands and ducts are within normal limits. Thyroid: Normal Lymph nodes: Right level 2 rounded nodes measuring 7  and 8 mm raise some concern for malignancy. Rounded posterior left level 3 lymph nodes are present. Vascular: Atherosclerotic changes are present at the carotid bifurcations bilaterally without significant stenosis. Additional calcifications are present at the origin of the left subclavian artery without significant stenosis. Limited intracranial: Negative. Visualized orbits: The globes and orbits are within normal limits. Mastoids and visualized paranasal sinuses: The paranasal sinuses and mastoid air cells are clear. Skeleton: Multilevel degenerative changes the cervical spine are most severe at C6-7 and C7-T1. 2 body heights are maintained. The patient is edentulous. Upper chest: Centrilobular emphysematous changes are present. Lung apices are otherwise clear. Thoracic inlet is within normal limits. IMPRESSION: 1. 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 Tumor crosses midline posteriorly and extends at least 5 mm below the right vocal cord. 2. Rounded right level 2 and posterior left level 3 lymph nodes are concerning for malignancy. PET scan of the neck would be useful to evaluate for any malignant nodes. 3. Aortic Atherosclerosis (ICD10-I70.0) and Emphysema (ICD10-J43.9). Electronically Signed   By: San Morelle M.D.   On: 05/21/2020 22:00   DG Chest Port 1 View  Result Date: 05/21/2020 CLINICAL DATA:  Difficulty breathing, COPD, CHF EXAM: PORTABLE CHEST 1 VIEW COMPARISON:  03/04/2020, 03/25/2020 FINDINGS: Single frontal view of the chest demonstrates a stable cardiac silhouette. There are chronic bilateral areas of scarring, with fibrosis greatest at the lung bases left greater than right. No acute airspace  disease. No effusion or pneumothorax. No acute bony abnormalities. IMPRESSION: 1. 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 No acute intrathoracic process. Electronically Signed   By: Randa Ngo M.D.    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 I again discussed a tracheotomy and he wants to proceed now. He definitely needs it. I have discussed trach and direct laryngoscopy. Discussed risks benefits and options. All questions answered and consent obtained. He has just eaten so will wait for 6 hours unless he is worse.   LOS: 1 day    Melissa Montane 05/22/2020

## 2020-05-22 NOTE — ED Notes (Signed)
ED TO INPATIENT HANDOFF REPORT  ED Nurse Name and Phone #: [phone]  S Name/Age/Gender [name] 79 y.o. male Room/Bed: WA15/WA15  Code Status   Code Status: Full Code  Home/SNF/Other Nursing Home Patient oriented to: self, place, time and situation Is this baseline? Yes   Triage Complete: Triage complete  Chief Complaint SOB (shortness of breath) [R06.02] Supraglottic mass [J38.7]  Triage Note Pt BIB EMS from [facility]. Facility called out for difficulty breathing. Hx of COPD and CHF. Pt was tripoding on arrival. Pt received neb treatment at facility and albuterol with EMS and sounds a little better. A&O x4.  93% RA BP 123/78 RR 22 HR 120 after albuterol CBG 88 20G LAC    Allergies No Known Allergies  Level of Care/Admitting Diagnosis ED Disposition    ED Disposition Condition Comment   Admit  Hospital Area: [facility] [[id number]]  Level of Care: ICU [6]  Covid Evaluation: Confirmed COVID Negative  Diagnosis: Supraglottic mass [166063]  Admitting Physician: [employer], [place]  Attending Physician: Bonnielee Haff [[id number]]  Estimated length of stay: past midnight tomorrow  Certification:: I certify this patient will need inpatient services for at least 2 midnights       B Medical/Surgery History Past Medical History:  Diagnosis Date  . Alcohol abuse   . CHF (congestive heart failure) ([place])   . COPD (chronic obstructive pulmonary disease) ([place])   . Seizures ([place])    History reviewed. No pertinent surgical history.   A IV Location/Drains/Wounds Patient Lines/Drains/Airways Status    Active Line/Drains/Airways    Name Placement date Placement time Site Days   Peripheral IV 05/21/20 Left Antecubital 05/21/20  --  Antecubital  1          Intake/Output Last 24 hours No intake or output data in the 24 hours ending 05/22/20 1415  Labs/Imaging Results for orders placed or performed during the hospital encounter of  05/21/20 (from the past 48 hour(s))  Basic metabolic panel     Status: Abnormal   Collection Time: 05/21/20  6:58 PM  Result Value Ref Range   Sodium 141 135 - 145 mmol/L   Potassium 3.4 (L) 3.5 - 5.1 mmol/L   Chloride 104 98 - 111 mmol/L   CO2 26 22 - 32 mmol/L   Glucose, Bld 102 (H) 70 - 99 mg/dL    Comment: Glucose reference range applies only to samples taken after fasting for at least 8 hours.   BUN 18 8 - 23 mg/dL   Creatinine, Ser 1.01 0.61 - 1.24 mg/dL   Calcium 9.1 8.9 - 10.3 mg/dL   GFR calc non Af Amer >60 >60 mL/min   GFR calc Af Amer >60 >60 mL/min   Anion gap 11 5 - 15    Comment: Performed at [facility], [place] [address] [place], [place] [address]  Brain natriuretic peptide     Status: Abnormal   Collection Time: 05/21/20  6:58 PM  Result Value Ref Range   B Natriuretic Peptide 284.5 (H) 0.0 - 100.0 pg/mL    Comment: Performed at [facility], [place] [address] [place], [place] [address]  CBC with Differential     Status: Abnormal   Collection Time: 05/21/20  6:58 PM  Result Value Ref Range   WBC 7.5 4.0 - 10.5 K/uL   RBC 4.04 (L) 4.22 - 5.81 MIL/uL   Hemoglobin 11.9 (L) 13.0 - 17.0 g/dL   HCT 33.5 (L) 39 - 52 %  MCV 82.9 80.0 - 100.0 fL   MCH 29.5 26.0 - 34.0 pg   MCHC 35.5 30.0 - 36.0 g/dL   RDW 13.5 11.5 - 15.5 %   Platelets 173 150 - 400 K/uL   nRBC 0.0 0.0 - 0.2 %   Neutrophils Relative % 70 %   Neutro Abs 5.3 1.7 - 7.7 K/uL   Lymphocytes Relative 15 %   Lymphs Abs 1.1 0.7 - 4.0 K/uL   Monocytes Relative 10 %   Monocytes Absolute 0.8 0 - 1 K/uL   Eosinophils Relative 4 %   Eosinophils Absolute 0.3 0 - 0 K/uL   Basophils Relative 1 %   Basophils Absolute 0.1 0 - 0 K/uL   Immature Granulocytes 0 %   Abs Immature Granulocytes 0.02 0.00 - 0.07 K/uL    Comment: Performed at [facility], [place] [address] [place], [place] [address]  SARS Coronavirus 2 by RT PCR (hospital order, performed in [facility] hospital lab) Nasopharyngeal Nasopharyngeal Swab     Status: None   Collection Time: 05/21/20  7:19 PM   Specimen: Nasopharyngeal Swab  Result Value Ref Range   SARS Coronavirus 2 NEGATIVE NEGATIVE    Comment: (NOTE) SARS-CoV-2 target nucleic acids are NOT DETECTED.  The SARS-CoV-2 RNA is generally detectable in upper and lower respiratory specimens during the acute phase of infection. The lowest concentration of SARS-CoV-2 viral copies this assay can detect is 250 copies / mL. A negative result does not preclude SARS-CoV-2 infection and should not be used as the sole basis for treatment or other patient management decisions.  A negative result may occur with improper specimen collection / handling, submission of specimen other than nasopharyngeal swab, presence of viral mutation(s) within the areas targeted by this assay, and inadequate number of viral copies (<250 copies / mL). A negative result must be combined with clinical observations, patient history, and epidemiological information.  Fact Sheet for Patients:   StrictlyIdeas.no  Fact Sheet for Healthcare Providers: BankingDealers.co.za  This test is not yet approved or  cleared by the [place] FDA and has been authorized for detection and/or diagnosis of SARS-CoV-2 by FDA under an Emergency Use Authorization (EUA).  This EUA will remain in effect (meaning this test can be used) for the duration of the COVID-19 declaration under Section 564(b)(1) of the Act, 21 U.S.C. section 360bbb-3(b)(1), unless the authorization is terminated or revoked sooner.  Performed at [facility], [place] [address] [place], [place] [address]   Blood gas, venous     Status: None   Collection Time: 05/21/20  7:30 PM  Result Value Ref Range   pH, Ven 7.358 7.25 - 7.43   pCO2, Ven 46.1 44 - 60 mmHg   pO2, Ven 44.4 32 - 45 mmHg   Bicarbonate 25.3 20.0 - 28.0 mmol/L    Acid-Base Excess 0.1 0.0 - 2.0 mmol/L   O2 Saturation 74.4 %   Patient temperature 98.6     Comment: Performed at [facility], [place] [address] [place], [place] [address]  CBC     Status: Abnormal   Collection Time: 05/22/20  4:31 AM  Result Value Ref Range   WBC 5.4 4.0 - 10.5 K/uL   RBC 4.20 (L) 4.22 - 5.81 MIL/uL   Hemoglobin 12.2 (L) 13.0 - 17.0 g/dL   HCT 34.6 (L) 39 - 52 %   MCV 82.4 80.0 - 100.0 fL   MCH 29.0 26.0 - 34.0 pg  MCHC 35.3 30.0 - 36.0 g/dL   RDW 13.6 11.5 - 15.5 %   Platelets 164 150 - 400 K/uL   nRBC 0.0 0.0 - 0.2 %    Comment: Performed at [facility], [place] [address], [place], [place] [address]  Comprehensive metabolic panel     Status: Abnormal   Collection Time: 05/22/20  4:31 AM  Result Value Ref Range   Sodium 138 135 - 145 mmol/L   Potassium 4.0 3.5 - 5.1 mmol/L   Chloride 103 98 - 111 mmol/L   CO2 25 22 - 32 mmol/L   Glucose, Bld 147 (H) 70 - 99 mg/dL    Comment: Glucose reference range applies only to samples taken after fasting for at least 8 hours.   BUN 16 8 - 23 mg/dL   Creatinine, Ser 0.83 0.61 - 1.24 mg/dL   Calcium 9.1 8.9 - 10.3 mg/dL   Total Protein 7.2 6.5 - 8.1 g/dL   Albumin 3.9 3.5 - 5.0 g/dL   AST 14 (L) 15 - 41 U/L   ALT 10 0 - 44 U/L   Alkaline Phosphatase 55 38 - 126 U/L   Total Bilirubin 1.0 0.3 - 1.2 mg/dL   GFR calc non Af Amer >60 >60 mL/min   GFR calc Af Amer >60 >60 mL/min   Anion gap 10 5 - 15    Comment: Performed at [facility], [place] [address] [place], [place] [address]   CT Soft Tissue Neck W Contrast  Result Date: 05/21/2020 CLINICAL DATA:  Right laryngeal mass.  Stridor. EXAM: CT NECK WITH CONTRAST TECHNIQUE: Multidetector CT imaging of the neck was performed using the standard protocol following the bolus administration of intravenous contrast. CONTRAST:  13mL OMNIPAQUE IOHEXOL 300 MG/ML  SOLN COMPARISON:  CT chest 07/10/2016 and 03/25/2020 FINDINGS:  Pharynx and larynx: Nasopharynx is clear. Soft palate is within normal limits. Tongue base and palatine tonsils are normal. The epiglottis is within normal limits. Soft tissue tumor fills the right area epiglottic fold. Tumor measures 3.3 x 3.7 x 3.0 cm. Tumor crosses midline posteriorly. Tumor extends at least 5 mm below the right vocal cord. Lower trachea is within normal limits. Salivary glands: Submandibular and parotid glands and ducts are within normal limits. Thyroid: Normal Lymph nodes: Right level 2 rounded nodes measuring 7 and 8 mm raise some concern for malignancy. Rounded posterior left level 3 lymph nodes are present. Vascular: Atherosclerotic changes are present at the carotid bifurcations bilaterally without significant stenosis. Additional calcifications are present at the origin of the left subclavian artery without significant stenosis. Limited intracranial: Negative. Visualized orbits: The globes and orbits are within normal limits. Mastoids and visualized paranasal sinuses: The paranasal sinuses and mastoid air cells are clear. Skeleton: Multilevel degenerative changes the cervical spine are most severe at C6-7 and C7-T1. 2 body heights are maintained. The patient is edentulous. Upper chest: Centrilobular emphysematous changes are present. Lung apices are otherwise clear. Thoracic inlet is within normal limits. IMPRESSION: 1. 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 Tumor crosses midline posteriorly and extends at least 5 mm below the right vocal cord. 2. Rounded right level 2 and posterior left level 3 lymph nodes are concerning for malignancy. PET scan of the neck would be useful to evaluate for any malignant nodes. 3. Aortic Atherosclerosis (ICD10-I70.0) and Emphysema (ICD10-J43.9). Electronically Signed   By: San Morelle M.D.   On: 05/21/2020 22:00  DG Chest Port 1 View  Result Date: 05/21/2020 CLINICAL DATA:  Difficulty  breathing, COPD, CHF EXAM: PORTABLE CHEST 1 VIEW COMPARISON:  03/04/2020, 03/25/2020 FINDINGS: Single frontal view of the chest demonstrates a stable cardiac silhouette. There are chronic bilateral areas of scarring, with fibrosis greatest at the lung bases left greater than right. No acute airspace disease. No effusion or pneumothorax. No acute bony abnormalities. IMPRESSION: 1. 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 No acute intrathoracic process. Electronically Signed   By: Randa Ngo M.D.   On: 05/21/2020 19:21    Pending Labs Unresulted Labs (From admission, onward) Comment          Start     Ordered   05/28/20 0500  Creatinine, serum  (enoxaparin (LOVENOX)    CrCl >/= 30 ml/min)  Weekly,   R     Comments: while on enoxaparin therapy    05/22/20 0430   05/23/20 0500  CBC  Daily,   R      05/22/20 1207   05/23/20 0500  Comprehensive metabolic panel  Daily,   R      05/22/20 1207          Vitals/Pain Today's Vitals   05/22/20 1136 05/22/20 1208 05/22/20 1300 05/22/20 1331  BP: 112/80 113/78 113/78 113/78  Pulse: 89 91  83  Resp: 16 17  15   Temp: 97.6 F (36.4 C) 97.6 F (36.4 C)  97.6 F (36.4 C)  TempSrc: Oral Oral  Oral  SpO2: 99% 99%  98%  Weight:      Height:      PainSc:        Isolation Precautions No active isolations  Medications Medications  sodium chloride (PF) 0.9 % injection (has no administration in time range)  dextrose 5 %-0.9 % sodium chloride infusion ( Intravenous New Bag/Given 05/22/20 0624)  acetaminophen (TYLENOL) tablet 650 mg (has no administration in time range)    Or  acetaminophen (TYLENOL) suppository 650 mg (has no administration in time range)  ondansetron (ZOFRAN) tablet 4 mg (has no administration in time range)    Or  ondansetron (ZOFRAN) injection 4 mg (has no administration in time range)  ipratropium-albuterol (DUONEB) 0.5-2.5 (3) MG/3ML nebulizer solution 3 mL (3 mLs Nebulization  Given 05/22/20 0806)  diltiazem (CARDIZEM CD) 24 hr capsule 120 mg (120 mg Oral Given 05/22/20 0933)  mometasone-formoterol (DULERA) 200-5 MCG/ACT inhaler 2 puff (2 puffs Inhalation Not Given 05/22/20 0801)  methylPREDNISolone sodium succinate (SOLU-MEDROL) 40 mg/mL injection 40 mg (has no administration in time range)  diltiazem (CARDIZEM) injection 10 mg (10 mg Intravenous Given 05/21/20 2005)  Racepinephrine HCl 2.25 % nebulizer solution 0.5 mL (0.5 mLs Nebulization Given 05/21/20 2049)  dexamethasone (DECADRON) injection 10 mg (10 mg Intravenous Given 05/21/20 2049)  iohexol (OMNIPAQUE) 300 MG/ML solution 75 mL (75 mLs Intravenous Contrast Given 05/21/20 2110)  dexamethasone (DECADRON) injection 10 mg (10 mg Intravenous Given 05/22/20 1037)    Mobility walks Moderate fall risk   Focused Assessments .   R Recommendations: See Admitting Provider Note  Report given to:   Additional Notes: n/a

## 2020-05-22 NOTE — Progress Notes (Signed)
[facility] PROGRESS NOTE   [name] EGB:[id number] DOB: 10/16/1941 DOA: 05/21/2020  PCP: Elsie Stain, MD  Brief History/Interval Summary: 79 y.o. male with medical history significant of remote alcohol and tobacco abuse, pulmonary fibrosis, COPD, atrial fibrillation among other things who is a cachectic young man presenting to the ER with progressive stridor some shortness of breath.  Patient reported choking on chicken leading to coughing spells.  He has had these episodes on and off with food.  He is also noted increasing shortness of breath with any stridor.  He was seen in the ER and evaluated.  ENT consulted and immediate evaluation in the ER showed an epiglottic mass.  Patient admitted to the hospital for further evaluation and treatment.  His shortness of breath is on and off.  Denied any fever or chills.  He has had some cough but symptoms have been going on gradually for a long period of time.  He used to smoke but quit many years ago.  Also has slowed down on drinking.  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   Reason for Visit: Epiglottic tumor causing stridor  Consultants: ENT.  PCCM.  Procedures: Plan is for tracheostomy this afternoon  Antibiotics: Anti-infectives (From admission, onward)   None      Subjective/Interval History: Patient poor historian.  Denies any pain but does admit to shortness of breath this morning.  Denies any chest pain.  No nausea vomiting.    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  I discussed with Dr. Janace Hoard this morning.  Plan is for tracheostomy this afternoon.  We also discussed the fact that patient is on Eliquis for anticoagulation.  Unfortunately he got a dose this morning.  Dr. Janace Hoard feels that patient's clinical condition requires that he proceed with surgery this afternoon.  Reversal agents for Eliquis is only used in the  setting of catastrophic bleeding.  Steroids.  Keep NPO.  Essential hypertension Blood pressure is very well controlled.  Patient noted to be on Cardizem.  Atrial fibrillation with RVR Patient was initially tachycardic when he presented to the ED.  He required Cardizem infusion.  Subsequently his heart rate stabilized.  He was started back on his oral Cardizem.  Patient also noted to be on Eliquis for stroke prevention.  This will be held due to need for surgery and to reduce risk of bleeding.  Hypokalemia Repleted  History of interstitial pulmonary fibrosis Stable.    DVT Prophylaxis: Eliquis placed on hold Code Status: Full code Family Communication: No family at bedside Disposition Plan: Disposition is unclear at this time.  Status is: Inpatient  Remains inpatient appropriate because:Ongoing diagnostic testing needed not appropriate for outpatient work up, IV treatments appropriate due to intensity of illness or inability to take PO and Need for surgery   Dispo: The patient is from: Home              Anticipated d/c is to: To be determined              Anticipated d/c date is: 3 days              Patient currently is not medically stable to d/c.      Medications:  Scheduled: . diltiazem  120 mg Oral Daily  . methylPREDNISolone (SOLU-MEDROL) injection  40 mg Intravenous Q12H  . mometasone-formoterol  2 puff Inhalation BID   Continuous: . dextrose 5 %  and 0.9% NaCl 100 mL/hr at 05/22/20 0624   EYC:[id number] **OR** acetaminophen, ipratropium-albuterol, ondansetron **OR** ondansetron (ZOFRAN) IV   Objective:  Vital Signs  Vitals:   05/22/20 0942 05/22/20 1018 05/22/20 1114 05/22/20 1136  BP: 110/81  112/80 112/80  Pulse: 87  86 89  Resp: 17  17 16   Temp: 97.6 F (36.4 C) 97.6 F (36.4 C) 97.6 F (36.4 C) 97.6 F (36.4 C)  TempSrc:   Oral Oral  SpO2: 97%  99% 99%  Weight:      Height:       No intake or output data in the 24 hours ending 05/22/20  1152 Filed Weights   05/21/20 1900  Weight: 50 kg    General appearance: Awake alert.  In no distress Stridor appreciated Resp: Mildly tachypneic.  Few crackles at the bases. Cardio: S1-S2 is normal regular.  No S3-S4.  No rubs murmurs or bruit GI: Abdomen is soft.  Nontender nondistended.  Bowel sounds are present normal.  No masses organomegaly Extremities: No edema.  Full range of motion of lower extremities. Neurologic:   No focal neurological deficits.    Lab Results:  Data Reviewed: I have personally reviewed following labs and imaging studies  CBC: Recent Labs  Lab 05/21/20 1858 05/22/20 0431  WBC 7.5 5.4  NEUTROABS 5.3  --   HGB 11.9* 12.2*  HCT 33.5* 34.6*  MCV 82.9 82.4  PLT 173 026    Basic Metabolic Panel: Recent Labs  Lab 05/21/20 1858 05/22/20 0431  NA 141 138  K 3.4* 4.0  CL 104 103  CO2 26 25  GLUCOSE 102* 147*  BUN 18 16  CREATININE 1.01 0.83  CALCIUM 9.1 9.1    GFR: Estimated Creatinine Clearance: 50.2 mL/min (by C-G formula based on SCr of 0.83 mg/dL).  Liver Function Tests: Recent Labs  Lab 05/22/20 0431  AST 14*  ALT 10  ALKPHOS 55  BILITOT 1.0  PROT 7.2  ALBUMIN 3.9     Recent Results (from the past 240 hour(s))  SARS Coronavirus 2 by RT PCR (hospital order, performed in [facility] hospital lab) Nasopharyngeal Nasopharyngeal Swab     Status: None   Collection Time: 05/21/20  7:19 PM   Specimen: Nasopharyngeal Swab  Result Value Ref Range Status   SARS Coronavirus 2 NEGATIVE NEGATIVE Final    Comment: (NOTE) SARS-CoV-2 target nucleic acids are NOT DETECTED.  The SARS-CoV-2 RNA is generally detectable in upper and lower respiratory specimens during the acute phase of infection. The lowest concentration of SARS-CoV-2 viral copies this assay can detect is 250 copies / mL. A negative result does not preclude SARS-CoV-2 infection and should not be used as the sole basis for treatment or other patient management decisions.   A negative result may occur with improper specimen collection / handling, submission of specimen other than nasopharyngeal swab, presence of viral mutation(s) within the areas targeted by this assay, and inadequate number of viral copies (<250 copies / mL). A negative result must be combined with clinical observations, patient history, and epidemiological information.  Fact Sheet for Patients:   StrictlyIdeas.no  Fact Sheet for Healthcare Providers: BankingDealers.co.za  This test is not yet approved or  cleared by the [place] FDA and has been authorized for detection and/or diagnosis of SARS-CoV-2 by FDA under an Emergency Use Authorization (EUA).  This EUA will remain in effect (meaning this test can be used) for the duration of the COVID-19 declaration under Section 564(b)(1) of the Act,  21 U.S.C. section 360bbb-3(b)(1), unless the authorization is terminated or revoked sooner.  Performed at [facility], [place] [address] [place], [place] [address]       Radiology Studies: CT Soft Tissue Neck W Contrast  Result Date: 05/21/2020 CLINICAL DATA:  Right laryngeal mass.  Stridor. EXAM: CT NECK WITH CONTRAST TECHNIQUE: Multidetector CT imaging of the neck was performed using the standard protocol following the bolus administration of intravenous contrast. CONTRAST:  47mL OMNIPAQUE IOHEXOL 300 MG/ML  SOLN COMPARISON:  CT chest 07/10/2016 and 03/25/2020 FINDINGS: Pharynx and larynx: Nasopharynx is clear. Soft palate is within normal limits. Tongue base and palatine tonsils are normal. The epiglottis is within normal limits. Soft tissue tumor fills the right area epiglottic fold. Tumor measures 3.3 x 3.7 x 3.0 cm. Tumor crosses midline posteriorly. Tumor extends at least 5 mm below the right vocal cord. Lower trachea is within normal limits. Salivary glands: Submandibular and parotid glands and ducts are within normal  limits. Thyroid: Normal Lymph nodes: Right level 2 rounded nodes measuring 7 and 8 mm raise some concern for malignancy. Rounded posterior left level 3 lymph nodes are present. Vascular: Atherosclerotic changes are present at the carotid bifurcations bilaterally without significant stenosis. Additional calcifications are present at the origin of the left subclavian artery without significant stenosis. Limited intracranial: Negative. Visualized orbits: The globes and orbits are within normal limits. Mastoids and visualized paranasal sinuses: The paranasal sinuses and mastoid air cells are clear. Skeleton: Multilevel degenerative changes the cervical spine are most severe at C6-7 and C7-T1. 2 body heights are maintained. The patient is edentulous. Upper chest: Centrilobular emphysematous changes are present. Lung apices are otherwise clear. Thoracic inlet is within normal limits. IMPRESSION: 1. 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 Tumor crosses midline posteriorly and extends at least 5 mm below the right vocal cord. 2. Rounded right level 2 and posterior left level 3 lymph nodes are concerning for malignancy. PET scan of the neck would be useful to evaluate for any malignant nodes. 3. Aortic Atherosclerosis (ICD10-I70.0) and Emphysema (ICD10-J43.9). Electronically Signed   By: San Morelle M.D.   On: 05/21/2020 22:00   DG Chest Port 1 View  Result Date: 05/21/2020 CLINICAL DATA:  Difficulty breathing, COPD, CHF EXAM: PORTABLE CHEST 1 VIEW COMPARISON:  03/04/2020, 03/25/2020 FINDINGS: Single frontal view of the chest demonstrates a stable cardiac silhouette. There are chronic bilateral areas of scarring, with fibrosis greatest at the lung bases left greater than right. No acute airspace disease. No effusion or pneumothorax. No acute bony abnormalities. IMPRESSION: 1. 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 No  acute intrathoracic process. Electronically Signed   By: Randa Ngo M.D.   On: 05/21/2020 19:21       LOS: 1 day   [facility] Pager on [email or web address]  05/22/2020, 11:52 AM

## 2020-05-22 NOTE — ED Notes (Signed)
Pt given urinal.

## 2020-05-22 NOTE — ED Notes (Signed)
Patient sister Delma Freeze was given update on patient status and inform of patient having a procedure today. [name] state that she will sent her daughter here.

## 2020-05-22 NOTE — ED Notes (Signed)
Spoke with Dr. Janace Hoard who states pt surgery will be postponed today and rescheduled for tomorrow d/t allowing blood thinner to wear off. Primary RN [place] notified.

## 2020-05-23 ENCOUNTER — Inpatient Hospital Stay (HOSPITAL_COMMUNITY): Payer: Medicare Other | Admitting: Certified Registered"

## 2020-05-23 ENCOUNTER — Encounter (HOSPITAL_COMMUNITY)
Admission: EM | Disposition: A | Discharge status: Nursing Home (Includes ICF) | Payer: Self-pay | Source: Home / Self Care | Attending: Internal Medicine

## 2020-05-23 ENCOUNTER — Inpatient Hospital Stay (HOSPITAL_COMMUNITY): Payer: Medicare Other | Admitting: Anesthesiology

## 2020-05-23 ENCOUNTER — Other Ambulatory Visit: Payer: Self-pay

## 2020-05-23 DIAGNOSIS — J387 Other diseases of larynx: Secondary | ICD-10-CM | POA: Diagnosis not present

## 2020-05-23 DIAGNOSIS — D649 Anemia, unspecified: Secondary | ICD-10-CM | POA: Diagnosis not present

## 2020-05-23 DIAGNOSIS — I4821 Permanent atrial fibrillation: Secondary | ICD-10-CM | POA: Diagnosis not present

## 2020-05-23 HISTORY — PX: TRACHEOSTOMY TUBE PLACEMENT: SHX814

## 2020-05-23 HISTORY — PX: DIRECT LARYNGOSCOPY: SHX5326

## 2020-05-23 LAB — GLUCOSE, CAPILLARY: Glucose-Capillary: 129 mg/dL — ABNORMAL HIGH (ref 70–99)

## 2020-05-23 SURGERY — CREATION, TRACHEOSTOMY
Anesthesia: General

## 2020-05-23 SURGERY — CREATION, TRACHEOSTOMY
Anesthesia: Monitor Anesthesia Care

## 2020-05-23 MED ORDER — ONDANSETRON HCL 4 MG/2ML IJ SOLN
INTRAMUSCULAR | Status: DC | PRN
Start: 2020-05-23 — End: 2020-05-23
  Administered 2020-05-23: 4 mg via INTRAVENOUS

## 2020-05-23 MED ORDER — OXYCODONE HCL 5 MG/5ML PO SOLN: 5.0000 mg | Freq: Once | ORAL | Status: DC | PRN

## 2020-05-23 MED ORDER — FENTANYL CITRATE (PF) 100 MCG/2ML IJ SOLN
INTRAMUSCULAR | Status: AC
  Filled 2020-05-23: qty 2

## 2020-05-23 MED ORDER — PROPOFOL 10 MG/ML IV BOLUS
INTRAVENOUS | Status: DC | PRN
Start: 2020-05-23 — End: 2020-05-23
  Administered 2020-05-23: 50 mg via INTRAVENOUS

## 2020-05-23 MED ORDER — FENTANYL CITRATE (PF) 250 MCG/5ML IJ SOLN
INTRAMUSCULAR | Status: DC | PRN
  Administered 2020-05-23: 50 ug via INTRAVENOUS
  Administered 2020-05-23: 25 ug via INTRAVENOUS

## 2020-05-23 MED ORDER — LABETALOL HCL 5 MG/ML IV SOLN
INTRAVENOUS | Status: AC
  Filled 2020-05-23: qty 4

## 2020-05-23 MED ORDER — MIDAZOLAM HCL 2 MG/2ML IJ SOLN
INTRAMUSCULAR | Status: AC
  Filled 2020-05-23: qty 2

## 2020-05-23 MED ORDER — 0.9 % SODIUM CHLORIDE (POUR BTL) OPTIME
TOPICAL | Status: DC | PRN
  Administered 2020-05-23: 1000 mL

## 2020-05-23 MED ORDER — METOPROLOL TARTRATE 5 MG/5ML IV SOLN
INTRAVENOUS | Status: AC
  Filled 2020-05-23: qty 5

## 2020-05-23 MED ORDER — OXYCODONE HCL 5 MG PO TABS: 5.0000 mg | ORAL_TABLET | Freq: Once | ORAL | Status: DC | PRN

## 2020-05-23 MED ORDER — ROCURONIUM BROMIDE 100 MG/10ML IV SOLN
INTRAVENOUS | Status: DC | PRN
Start: 2020-05-23 — End: 2020-05-23
  Administered 2020-05-23: 20 mg via INTRAVENOUS

## 2020-05-23 MED ORDER — OXYMETAZOLINE HCL 0.05 % NA SOLN
NASAL | Status: AC
  Filled 2020-05-23: qty 30

## 2020-05-23 MED ORDER — LIDOCAINE-EPINEPHRINE 1 %-1:100000 IJ SOLN
INTRAMUSCULAR | Status: DC | PRN
  Administered 2020-05-23: 6 mL

## 2020-05-23 MED ORDER — FENTANYL CITRATE (PF) 100 MCG/2ML IJ SOLN: 25.0000 ug | INTRAMUSCULAR | Status: DC | PRN

## 2020-05-23 MED ORDER — FENTANYL CITRATE (PF) 100 MCG/2ML IJ SOLN
25.0000 ug | INTRAMUSCULAR | Status: DC | PRN
  Administered 2020-05-23: 25 ug via INTRAVENOUS

## 2020-05-23 MED ORDER — "THROMBI-PAD 3""X3"" EX PADS"
1.0000 | MEDICATED_PAD | Freq: Once | CUTANEOUS | Status: AC
  Administered 2020-05-23: 1 via TOPICAL
  Filled 2020-05-23: qty 1

## 2020-05-23 MED ORDER — OXYMETAZOLINE HCL 0.05 % NA SOLN
NASAL | Status: DC | PRN
  Administered 2020-05-23: 1

## 2020-05-23 MED ORDER — LACTATED RINGERS IV SOLN
INTRAVENOUS | Status: DC | PRN
Start: 2020-05-23 — End: 2020-05-23

## 2020-05-23 MED ORDER — SUGAMMADEX SODIUM 200 MG/2ML IV SOLN
INTRAVENOUS | Status: DC | PRN
Start: 2020-05-23 — End: 2020-05-23
  Administered 2020-05-23: 200 mg via INTRAVENOUS

## 2020-05-23 MED ORDER — ONDANSETRON HCL 4 MG/2ML IJ SOLN: 4.0000 mg | Freq: Four times a day (QID) | INTRAMUSCULAR | Status: DC | PRN

## 2020-05-23 MED ORDER — METOPROLOL TARTRATE 5 MG/5ML IV SOLN
2.5000 mg | Freq: Once | INTRAVENOUS | Status: AC
  Administered 2020-05-23: 2.5 mg via INTRAVENOUS

## 2020-05-23 MED ORDER — DEXMEDETOMIDINE (PRECEDEX) IN NS 20 MCG/5ML (4 MCG/ML) IV SYRINGE
PREFILLED_SYRINGE | INTRAVENOUS | Status: DC | PRN
  Administered 2020-05-23 (×4): 4 ug via INTRAVENOUS

## 2020-05-23 SURGICAL SUPPLY — 32 items
BLADE CLIPPER SURG (BLADE) ×2 IMPLANT
BLADE SURG 15 STRL LF DISP TIS (BLADE) ×1 IMPLANT
BLADE SURG 15 STRL SS (BLADE) ×2
BLADE SURG SZ11 CARB STEEL (BLADE) ×2 IMPLANT
CLEANER TIP ELECTROSURG 2X2 (MISCELLANEOUS) ×2 IMPLANT
COVER SURGICAL LIGHT HANDLE (MISCELLANEOUS) ×2 IMPLANT
COVER WAND RF STERILE (DRAPES) IMPLANT
ELECT COATED BLADE 2.86 ST (ELECTRODE) ×2 IMPLANT
ELECT REM PT RETURN 15FT ADLT (MISCELLANEOUS) ×2 IMPLANT
GAUZE 4X4 16PLY RFD (DISPOSABLE) ×2 IMPLANT
GLOVE ECLIPSE 7.5 STRL STRAW (GLOVE) ×2 IMPLANT
HOLDER TRACH TUBE VELCRO 19.5 (MISCELLANEOUS) ×2 IMPLANT
KIT BASIN OR (CUSTOM PROCEDURE TRAY) ×2 IMPLANT
KIT TURNOVER KIT A (KITS) IMPLANT
NEEDLE HYPO 25X1 1.5 SAFETY (NEEDLE) ×2 IMPLANT
NS IRRIG 1000ML POUR BTL (IV SOLUTION) ×2 IMPLANT
PACK EENT SPLIT (PACKS) ×2 IMPLANT
PENCIL FOOT CONTROL (ELECTRODE) ×2 IMPLANT
PENCIL SMOKE EVACUATOR (MISCELLANEOUS) IMPLANT
PROTECTOR NERVE ULNAR (MISCELLANEOUS) ×4 IMPLANT
SPONGE DRAIN TRACH 4X4 STRL 2S (GAUZE/BANDAGES/DRESSINGS) ×2 IMPLANT
STRIP CLOSURE SKIN 1/2X4 (GAUZE/BANDAGES/DRESSINGS) ×2 IMPLANT
SUT CHROMIC GUT 2 0 PS 2 27 (SUTURE) ×2 IMPLANT
SUT ETHILON 3 0 PS 1 (SUTURE) ×2 IMPLANT
SUT SILK 3 0 (SUTURE) ×2
SUT SILK 3 0 SH 30 (SUTURE) ×2 IMPLANT
SUT SILK 3-0 18XBRD TIE 12 (SUTURE) ×1 IMPLANT
SYR 10ML LL (SYRINGE) ×2 IMPLANT
SYR CONTROL 10ML LL (SYRINGE) ×2 IMPLANT
TOWEL OR 17X26 10 PK STRL BLUE (TOWEL DISPOSABLE) ×2 IMPLANT
WATER STERILE IRR 1000ML POUR (IV SOLUTION) ×2 IMPLANT
YANKAUER SUCT BULB TIP 10FT TU (MISCELLANEOUS) ×2 IMPLANT

## 2020-05-23 SURGICAL SUPPLY — 47 items
BLADE CLIPPER SURG (BLADE) IMPLANT
BLADE SURG 15 STRL LF DISP TIS (BLADE) ×1 IMPLANT
BLADE SURG 15 STRL SS (BLADE) ×3
CANISTER SUCT 3000ML PPV (MISCELLANEOUS) ×3 IMPLANT
CLEANER TIP ELECTROSURG 2X2 (MISCELLANEOUS) ×3 IMPLANT
CLOSURE WOUND 1/2 X4 (GAUZE/BANDAGES/DRESSINGS)
COVER BACK TABLE 60X90IN (DRAPES) ×3 IMPLANT
COVER MAYO STAND STRL (DRAPES) ×3 IMPLANT
COVER SURGICAL LIGHT HANDLE (MISCELLANEOUS) ×3 IMPLANT
COVER WAND RF STERILE (DRAPES) ×3 IMPLANT
DRAPE HALF SHEET 40X57 (DRAPES) ×3 IMPLANT
ELECT COATED BLADE 2.86 ST (ELECTRODE) ×3 IMPLANT
ELECT REM PT RETURN 9FT ADLT (ELECTROSURGICAL) ×3
ELECTRODE REM PT RTRN 9FT ADLT (ELECTROSURGICAL) ×1 IMPLANT
GAUZE 4X4 16PLY RFD (DISPOSABLE) ×3 IMPLANT
GLOVE ECLIPSE 7.5 STRL STRAW (GLOVE) ×6 IMPLANT
GOWN STRL REUS W/ TWL LRG LVL3 (GOWN DISPOSABLE) ×2 IMPLANT
GOWN STRL REUS W/TWL LRG LVL3 (GOWN DISPOSABLE) ×6
GUARD TEETH (MISCELLANEOUS) IMPLANT
KIT BASIN OR (CUSTOM PROCEDURE TRAY) ×3 IMPLANT
KIT TURNOVER KIT B (KITS) ×3 IMPLANT
NEEDLE HYPO 25GX1X1/2 BEV (NEEDLE) ×3 IMPLANT
NS IRRIG 1000ML POUR BTL (IV SOLUTION) ×3 IMPLANT
PAD ARMBOARD 7.5X6 YLW CONV (MISCELLANEOUS) ×6 IMPLANT
PATTIES SURGICAL .5 X3 (DISPOSABLE) IMPLANT
PATTIES SURGICAL .5X1.5 (GAUZE/BANDAGES/DRESSINGS) ×3 IMPLANT
PENCIL FOOT CONTROL (ELECTRODE) ×3 IMPLANT
POSITIONER HEAD DONUT 9IN (MISCELLANEOUS) IMPLANT
SOL ANTI FOG 6CC (MISCELLANEOUS) IMPLANT
SOLUTION ANTI FOG 6CC (MISCELLANEOUS)
SPECIMEN JAR SMALL (MISCELLANEOUS) IMPLANT
SPONGE DRAIN TRACH 4X4 STRL 2S (GAUZE/BANDAGES/DRESSINGS) ×3 IMPLANT
STRIP CLOSURE SKIN 1/2X4 (GAUZE/BANDAGES/DRESSINGS) IMPLANT
SURGILUBE 2OZ TUBE FLIPTOP (MISCELLANEOUS) IMPLANT
SUT CHROMIC 3 0 PS 2 (SUTURE) ×6 IMPLANT
SUT CHROMIC GUT 2 0 PS 2 27 (SUTURE) ×3 IMPLANT
SUT ETHILON 3 0 PS 1 (SUTURE) ×6 IMPLANT
SUT SILK 3 0 SH 30 (SUTURE) ×6 IMPLANT
SUT SILK 3 0 TIES 17X18 (SUTURE) ×3
SUT SILK 3-0 18XBRD TIE BLK (SUTURE) ×1 IMPLANT
SYR CONTROL 10ML LL (SYRINGE) ×3 IMPLANT
TOWEL GREEN STERILE FF (TOWEL DISPOSABLE) ×6 IMPLANT
TRAY ENT MC OR (CUSTOM PROCEDURE TRAY) ×3 IMPLANT
TUBE CONNECTING 12'X1/4 (SUCTIONS) ×1
TUBE CONNECTING 12X1/4 (SUCTIONS) ×2 IMPLANT
TUBE TRACH SHILEY  6 DIST  CUF (TUBING) ×3 IMPLANT
WATER STERILE IRR 1000ML POUR (IV SOLUTION) ×3 IMPLANT

## 2020-05-23 NOTE — Progress Notes (Signed)
[facility] PCCM  Patient moved from [facility] to [facility] for tracheostomy which was performed today without difficulty.  He tolerated the procedure well, in ICU not requiring mechanical ventilation.  PCCM available as needed  Roselie Awkward, MD [facility] Pager: [phone] Cell: [phone] If no response, call [phone]

## 2020-05-23 NOTE — Anesthesia Procedure Notes (Signed)
Date/Time: 05/23/2020 10:35 AM Performed by: Mariea Clonts, CRNA Patient Re-evaluated:Patient Re-evaluated prior to induction Oxygen Delivery Method: Circle system utilized Preoxygenation: Pre-oxygenation with 100% oxygen Airway Equipment and Method: Tracheostomy Placement Confirmation: positive ETCO2 and breath sounds checked- equal and bilateral

## 2020-05-23 NOTE — Progress Notes (Signed)
Patient ID: Caine Barfield, male   DOB: 1941-03-26, 79 y.o.   MRN: [id number]  Called about patient bleeding from around the trach.  It has been bleeding for about 4 hours according to the nurse and there has been 3 gauze changes.  The gauze is completely saturated with blood.  He is most likely still under the influence of Eliquis.  He had a dose yesterday in the morning.  We most likely will need to wait this out a little bit but there was a fair amount of clots around the trach.  I cleaned up the clots and examined the tracheal site.  Tilting the trach forward the wound was examined and all the clots were suctioned out.  It appeared there was some oozing coming from the right side.  Once the clots and blood were suction Avista powder was placed into the wound.  Surgicel was then packed on top of this throughout the trach site.  The trach was then placed back in its position and the trach ties were secured.  He had good airway and good air movement.  I did not appear any of the blood is getting into the lungs or trachea.  The nurses will now watch this and change the gauze pad and call me if it is exceeding 1-2 gauze pads per hour.

## 2020-05-23 NOTE — Progress Notes (Signed)
Chart reviewed-transferred from [facility] by Dr. [place] Pink earlier this morning-please see his note for further details.  Patient is s/p tracheotomy by Dr. Janace Hoard this morning-fiberoptic bronchoscopy showed a large tumor involving his right glottis..  Patient evaluated at bedside-appears very comfortable.  Vital signs stable.  Eliquis remains on hold-we will continue to monitor closely.  Follow biopsy results.

## 2020-05-23 NOTE — Anesthesia Postprocedure Evaluation (Signed)
Anesthesia Post Note  Patient: [name]  Procedure(s) Performed: TRACHEOSTOMY (N/A ) DIRECT LARYNGOSCOPY with BIOPSY (N/A )     Patient location during evaluation: PACU Anesthesia Type: General Level of consciousness: awake and alert Pain management: pain level controlled Vital Signs Assessment: post-procedure vital signs reviewed and stable Respiratory status: spontaneous breathing, nonlabored ventilation, respiratory function stable and patient connected to tracheostomy mask oxygen (s/p tracheostomy) Cardiovascular status: blood pressure returned to baseline and stable (A-fib) Postop Assessment: no apparent nausea or vomiting Anesthetic complications: no   No complications documented.  Last Vitals:  Vitals:   05/23/20 1220 05/23/20 1229  BP: (!) 142/93   Pulse: 58 96  Resp: 15 17  Temp:    SpO2: 92% 95%    Last Pain:  Vitals:   05/23/20 1145  TempSrc:   PainSc: Asleep                 Catalina Gravel

## 2020-05-23 NOTE — Anesthesia Procedure Notes (Signed)
Procedure Name: MAC Date/Time: 05/23/2020 10:12 AM Performed by: Mariea Clonts, CRNA Pre-anesthesia Checklist: Patient identified, Emergency Drugs available, Suction available, Patient being monitored and Timeout performed Patient Re-evaluated:Patient Re-evaluated prior to induction Oxygen Delivery Method: Nasal cannula

## 2020-05-23 NOTE — Progress Notes (Addendum)
[facility] PROGRESS NOTE   [name] Tiffany QJJ:[id number] DOB: Aug 07, 1941 DOA: 05/21/2020  PCP: Elsie Stain, MD  Brief History/Interval Summary: 79 y.o. male with medical history significant of remote alcohol and tobacco abuse, pulmonary fibrosis, COPD, atrial fibrillation among other things who is a cachectic young man presenting to the ER with progressive stridor some shortness of breath.  Patient reported choking on chicken leading to coughing spells.  He has had these episodes on and off with food.  He is also noted increasing shortness of breath with any stridor.  He was seen in the ER and evaluated.  ENT consulted and immediate evaluation in the ER showed an epiglottic mass.  Patient admitted to the hospital for further evaluation and treatment.  His shortness of breath is on and off.  Denied any fever or chills.  He has had some cough but symptoms have been going on gradually for a long period of time.  He used to smoke but quit many years ago.  Also has slowed down on drinking.  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   Reason for Visit: Epiglottic tumor causing stridor  Consultants: ENT.  PCCM.  Procedures: Plan is for tracheostomy  Antibiotics: Anti-infectives (From admission, onward)   None      Subjective/Interval History: Patient remains a poor historian.  Denies any complaints this morning.  No overnight events noted.      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 Janace Hoard with ENT is following.  Plan was for tracheostomy yesterday however since patient was on Eliquis and he was otherwise stable it was decided to change his tracheostomy to today.  Unfortunately [facility] does not have the necessary equipment for the tracheostomy.  So the plan will be for the patient to now go to [facility].  Discussed with providers at [facility] including the hospitalist as well as the critical care  medicine team.  They will see the patient once he comes out of the OR.  Continue with steroids.  Keep n.p.o.  Will eventually need biopsy and diagnosis and will possibly need to be seen by palliative care if not a candidate for aggressive treatments.  Essential hypertension Well controlled.  Continue Cardizem.    Atrial fibrillation with RVR Patient was initially tachycardic when he presented to the ED.  He required Cardizem infusion.  Subsequently his heart rate stabilized.  He was started back on his oral Cardizem.  Patient also noted to be on Eliquis for stroke prevention.  This has been held due to need for surgery.  He is followed by the atrial fibrillation clinic at [facility].  Hypokalemia Normal this morning.  History of interstitial pulmonary fibrosis Stable.  Has been seen and followed by Dr. Asencion Noble previously.  Normocytic anemia Monitor hemoglobin.  Stable.  No evidence of overt bleeding.  DVT Prophylaxis: Eliquis placed on hold.  SCDs. Code Status: Full code Family Communication: No family at bedside Disposition Plan: Disposition is unclear at this time.  Status is: Inpatient  Remains inpatient appropriate because:Ongoing diagnostic testing needed not appropriate for outpatient work up, IV treatments appropriate due to intensity of illness or inability to take PO and Need for surgery   Dispo: The patient is from: Home              Anticipated d/c is to: To be determined              Anticipated d/c  date is: 3 days              Patient currently is not medically stable to d/c.      Medications:  Scheduled: . [MAR Hold] chlorhexidine  15 mL Mouth Rinse BID  . [MAR Hold] Chlorhexidine Gluconate Cloth  6 each Topical Daily  . [MAR Hold] diltiazem  120 mg Oral Daily  . [MAR Hold] mouth rinse  15 mL Mouth Rinse q12n4p  . [MAR Hold] methylPREDNISolone (SOLU-MEDROL) injection  40 mg Intravenous Q12H  . [MAR Hold] mometasone-formoterol  2 puff Inhalation BID    Continuous: . dextrose 5 % and 0.9% NaCl 100 mL/hr at 05/23/20 0549   PRN:[MAR Hold] acetaminophen **OR** [MAR Hold] acetaminophen, [MAR Hold] ipratropium-albuterol, [MAR Hold] ondansetron **OR** [MAR Hold] ondansetron (ZOFRAN) IV   Objective:  Vital Signs  Vitals:   05/23/20 0700 05/23/20 0800 05/23/20 0820 05/23/20 0830  BP:  (!) 140/76 (!) 144/84 124/85  Pulse: 60 61    Resp: 17 15 14 15   Temp:      TempSrc:      SpO2: 92% 94%    Weight:      Height:        Intake/Output Summary (Last 24 hours) at 05/23/2020 1006 Last data filed at 05/23/2020 0939 Gross per 24 hour  Intake 1777.76 ml  Output 350 ml  Net 1427.76 ml   Filed Weights   05/21/20 1900  Weight: 50 kg    General appearance: Awake alert.  In no distress Stridor is heard Resp: Mildly tachypneic.  Stridor is appreciated.  Coarse breath sounds with crackles at the bases.   Cardio: S1-S2 is normal regular.  No S3-S4.  No rubs murmurs or bruit GI: Abdomen is soft.  Nontender nondistended.  Bowel sounds are present normal.  No masses organomegaly Extremities: No edema.  Full range of motion of lower extremities. Neurologic:  No focal neurological deficits.     Lab Results:  Data Reviewed: I have personally reviewed following labs and imaging studies  CBC: Recent Labs  Lab 05/21/20 1858 05/22/20 0431  WBC 7.5 5.4  NEUTROABS 5.3  --   HGB 11.9* 12.2*  HCT 33.5* 34.6*  MCV 82.9 82.4  PLT 173 169    Basic Metabolic Panel: Recent Labs  Lab 05/21/20 1858 05/22/20 0431  NA 141 138  K 3.4* 4.0  CL 104 103  CO2 26 25  GLUCOSE 102* 147*  BUN 18 16  CREATININE 1.01 0.83  CALCIUM 9.1 9.1    GFR: Estimated Creatinine Clearance: 50.2 mL/min (by C-G formula based on SCr of 0.83 mg/dL).  Liver Function Tests: Recent Labs  Lab 05/22/20 0431  AST 14*  ALT 10  ALKPHOS 55  BILITOT 1.0  PROT 7.2  ALBUMIN 3.9     Recent Results (from the past 240 hour(s))  SARS Coronavirus 2 by RT PCR  (hospital order, performed in [facility] hospital lab) Nasopharyngeal Nasopharyngeal Swab     Status: None   Collection Time: 05/21/20  7:19 PM   Specimen: Nasopharyngeal Swab  Result Value Ref Range Status   SARS Coronavirus 2 NEGATIVE NEGATIVE Final    Comment: (NOTE) SARS-CoV-2 target nucleic acids are NOT DETECTED.  The SARS-CoV-2 RNA is generally detectable in upper and lower respiratory specimens during the acute phase of infection. The lowest concentration of SARS-CoV-2 viral copies this assay can detect is 250 copies / mL. A negative result does not preclude SARS-CoV-2 infection and should not be used as the  sole basis for treatment or other patient management decisions.  A negative result may occur with improper specimen collection / handling, submission of specimen other than nasopharyngeal swab, presence of viral mutation(s) within the areas targeted by this assay, and inadequate number of viral copies (<250 copies / mL). A negative result must be combined with clinical observations, patient history, and epidemiological information.  Fact Sheet for Patients:   StrictlyIdeas.no  Fact Sheet for Healthcare Providers: BankingDealers.co.za  This test is not yet approved or  cleared by the [place] FDA and has been authorized for detection and/or diagnosis of SARS-CoV-2 by FDA under an Emergency Use Authorization (EUA).  This EUA will remain in effect (meaning this test can be used) for the duration of the COVID-19 declaration under Section 564(b)(1) of the Act, 21 U.S.C. section 360bbb-3(b)(1), unless the authorization is terminated or revoked sooner.  Performed at [facility], [place] [address], [place], [place] [address]   MRSA PCR Screening     Status: None   Collection Time: 05/22/20  8:54 PM   Specimen: Nasal Mucosa; Nasopharyngeal  Result Value Ref Range Status   MRSA by PCR NEGATIVE NEGATIVE  Final    Comment:        The GeneXpert MRSA Assay (FDA approved for NASAL specimens only), is one component of a comprehensive MRSA colonization surveillance program. It is not intended to diagnose MRSA infection nor to guide or monitor treatment for MRSA infections. Performed at [facility], [place] [address], [place], [place] [address]       Radiology Studies: CT Soft Tissue Neck W Contrast  Result Date: 05/21/2020 CLINICAL DATA:  Right laryngeal mass.  Stridor. EXAM: CT NECK WITH CONTRAST TECHNIQUE: Multidetector CT imaging of the neck was performed using the standard protocol following the bolus administration of intravenous contrast. CONTRAST:  47mL OMNIPAQUE IOHEXOL 300 MG/ML  SOLN COMPARISON:  CT chest 07/10/2016 and 03/25/2020 FINDINGS: Pharynx and larynx: Nasopharynx is clear. Soft palate is within normal limits. Tongue base and palatine tonsils are normal. The epiglottis is within normal limits. Soft tissue tumor fills the right area epiglottic fold. Tumor measures 3.3 x 3.7 x 3.0 cm. Tumor crosses midline posteriorly. Tumor extends at least 5 mm below the right vocal cord. Lower trachea is within normal limits. Salivary glands: Submandibular and parotid glands and ducts are within normal limits. Thyroid: Normal Lymph nodes: Right level 2 rounded nodes measuring 7 and 8 mm raise some concern for malignancy. Rounded posterior left level 3 lymph nodes are present. Vascular: Atherosclerotic changes are present at the carotid bifurcations bilaterally without significant stenosis. Additional calcifications are present at the origin of the left subclavian artery without significant stenosis. Limited intracranial: Negative. Visualized orbits: The globes and orbits are within normal limits. Mastoids and visualized paranasal sinuses: The paranasal sinuses and mastoid air cells are clear. Skeleton: Multilevel degenerative changes the cervical spine are most severe at C6-7 and  C7-T1. 2 body heights are maintained. The patient is edentulous. Upper chest: Centrilobular emphysematous changes are present. Lung apices are otherwise clear. Thoracic inlet is within normal limits. IMPRESSION: 1. 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 Tumor crosses midline posteriorly and extends at least 5 mm below the right vocal cord. 2. Rounded right level 2 and posterior left level 3 lymph nodes are concerning for malignancy. PET scan of the neck would be useful to evaluate for any malignant nodes. 3. Aortic Atherosclerosis (ICD10-I70.0) and  Emphysema (ICD10-J43.9). Electronically Signed   By: San Morelle M.D.   On: 05/21/2020 22:00   DG Chest Port 1 View  Result Date: 05/21/2020 CLINICAL DATA:  Difficulty breathing, COPD, CHF EXAM: PORTABLE CHEST 1 VIEW COMPARISON:  03/04/2020, 03/25/2020 FINDINGS: Single frontal view of the chest demonstrates a stable cardiac silhouette. There are chronic bilateral areas of scarring, with fibrosis greatest at the lung bases left greater than right. No acute airspace disease. No effusion or pneumothorax. No acute bony abnormalities. IMPRESSION: 1. 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 No acute intrathoracic process. Electronically Signed   By: Randa Ngo M.D.   On: 05/21/2020 19:21       LOS: 2 days   [facility] Pager on [email or web address]  05/23/2020, 10:06 AM

## 2020-05-23 NOTE — Progress Notes (Signed)
Dr. Sloan Leiter and Dr. Janace Hoard made aware of patient continuously oozing blood at trach site. Obtained order for thrombi-pad.

## 2020-05-23 NOTE — Op Note (Signed)
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  Once the discussion ensued he clearly had rid himself of the chicken and fiberoptic exam revealed a large tumor of his right glottis.  He underwent a CT scan which showed a 3 cm mass that involved the supraglottis glottis and subglottis.  The patient was stridorous.  He was scheduled for a tracheotomy at [facility] but once in the operating room the operating room did not have a trach set.  The patient was transferred from [facility] OR to the cone of the OR.  Patient was informed risk and benefits of the procedure and options were discussed all questions were answered and consent was obtained. Procedure: Patient was taken the operating placed in the supine position slightly elevated and prepped and draped in usual sterile manner.  A marking pen was used to mark the position of the cricoid and a vertical incision after injecting 1% lidocaine with 1 100,000 epinephrine was made.  Dissection was carried down to the strap muscles with the diastases the strap muscles divided.  The thyroid cartilage was identified and divided in its isthmus with the electrocautery.  The trachea was identified.  A cricoid was placed into the cricoid and the trachea was entered after making a incision with the electrocautery and opening with a hemostat.  Inferior based flap was created and a 3-0 chromic placed through the flap and secured inferiorly to the subcutaneous tissue.  #6 Shiley was placed without difficulty.  The patient's airway was immediately improved and no further stridor.  There was good end-tidal CO2 return.  The trach was secured with 3-0 nylon and Velcro trach collar.   There was good hemostasis.  The table was turned the patient was placed in the [facility] position and examined with a Dedo scope.  There was a very large tumor that extended from the area epiglottic fold down to the right arytenoid.  The entire arytenoid was involved as well as the right glottis.  The tumor did extend deep to the right vocal cord.  The vocal cord was fixed.  The tumor extended laterally staying on the arytenoid but on the posterior aspect along the piriform sinus and postcricoid area.  The tumor was biopsied multiple times with cup forcep.  It did bleed but Afrin-soaked pledgets were used to gain hemostasis.  Blood was suctioned out and the patient was then awakened brought to recovery in stable condition counts correct

## 2020-05-23 NOTE — Transfer of Care (Signed)
Immediate Anesthesia Transfer of Care Note  Patient: [name]  Procedure(s) Performed: TRACHEOSTOMY (N/A ) DIRECT LARYNGOSCOPY (N/A )  Patient Location:  pt moniotred in PACU by anes awaiting care link-- report to [facility] RN  Anesthesia Type: no sedation given-- monitored- OR equipment not available for case  Level of Consciousness: alert   Airway & Oxygen Therapy: Patient Spontanous Breathing  Post-op Assessment: Report given to RN and Post -op Vital signs reviewed and stable  Post vital signs: Reviewed and stable  Last Vitals:  Vitals Value Taken Time  BP    Temp    Pulse    Resp 19 05/23/20 0934  SpO2    Vitals shown include unvalidated device data.  Last Pain:  Vitals:   05/23/20 0408  TempSrc:   PainSc: 0-No pain         Complications: No complications documented.

## 2020-05-23 NOTE — Anesthesia Preprocedure Evaluation (Signed)
Anesthesia Evaluation  Patient identified by MRN, date of birth, ID band Patient awake    Reviewed: Allergy & Precautions, H&P , NPO status , Patient's Chart, lab work & pertinent test results  Airway Mallampati: II   Neck ROM: full    Dental   Pulmonary COPD, former smoker,  Pt is stridorous from a 3 cm supraglottic mass that is obstructing the upper airway.   breath sounds clear to auscultation  unstable     Cardiovascular +CHF  + dysrhythmias Atrial Fibrillation  Rhythm:regular Rate:Normal     Neuro/Psych Seizures -,     GI/Hepatic (+)     substance abuse  alcohol use,   Endo/Other    Renal/GU      Musculoskeletal   Abdominal   Peds  Hematology   Anesthesia Other Findings   Reproductive/Obstetrics                             Anesthesia Physical Anesthesia Plan  ASA: III  Anesthesia Plan: MAC   Post-op Pain Management:    Induction: Intravenous  PONV Risk Score and Plan: 1 and Ondansetron, Midazolam and Treatment may vary due to age or medical condition  Airway Management Planned: Simple Face Mask and Tracheostomy  Additional Equipment:   Intra-op Plan:   Post-operative Plan:   Informed Consent: I have reviewed the patients History and Physical, chart, labs and discussed the procedure including the risks, benefits and alternatives for the proposed anesthesia with the patient or authorized representative who has indicated his/her understanding and acceptance.       Plan Discussed with: CRNA, Anesthesiologist and Surgeon  Anesthesia Plan Comments:         Anesthesia Quick Evaluation

## 2020-05-23 NOTE — Anesthesia Preprocedure Evaluation (Signed)
Anesthesia Evaluation  Patient identified by MRN, date of birth, ID band Patient awake    Reviewed: Allergy & Precautions, NPO status , Patient's Chart, lab work & pertinent test resultsPreop documentation limited or incomplete due to emergent nature of procedure.  Airway Mallampati: II  TM Distance: >3 FB Neck ROM: Full    Dental  (+) Dental Advisory Given, Edentulous Lower, Edentulous Upper   Pulmonary shortness of breath, COPD, former smoker,  Idiopathic pulmonary fibrosis Stridor    + wheezing + stridor     Cardiovascular +CHF  + dysrhythmias Atrial Fibrillation  Rhythm:Irregular Rate:Abnormal     Neuro/Psych Seizures -,     GI/Hepatic negative GI ROS, (+)     substance abuse  alcohol use,   Endo/Other  negative endocrine ROS  Renal/GU negative Renal ROS     Musculoskeletal negative musculoskeletal ROS (+)   Abdominal   Peds  Hematology  (+) Blood dyscrasia, anemia ,   Anesthesia Other Findings Day of surgery medications reviewed with the patient.  Laryngeal mass  Reproductive/Obstetrics                             Anesthesia Physical Anesthesia Plan  ASA: IV and emergent  Anesthesia Plan: General   Post-op Pain Management:    Induction: Intravenous  PONV Risk Score and Plan: 2 and Dexamethasone, Ondansetron and Treatment may vary due to age or medical condition  Airway Management Planned: Tracheostomy  Additional Equipment:   Intra-op Plan:   Post-operative Plan: Extubation in OR  Informed Consent: I have reviewed the patients History and Physical, chart, labs and discussed the procedure including the risks, benefits and alternatives for the proposed anesthesia with the patient or authorized representative who has indicated his/her understanding and acceptance.     Dental advisory given  Plan Discussed with: CRNA  Anesthesia Plan Comments: (Patient taken  directly to OR, brief chart review and bedside interview prior to beginning of procedure.  Awake trach followed by GA via trach for direct laryngoscopy.)        Anesthesia Quick Evaluation

## 2020-05-23 NOTE — Progress Notes (Signed)
Dr. Janace Hoard updated on patients' bleeding around the trach.1/4 bloody drainage noted on gauze since 1930.  Clean gauze applied. Continuing to monitor

## 2020-05-23 NOTE — Anesthesia Procedure Notes (Addendum)
Procedure Name: MAC Date/Time: 05/23/2020 8:53 AM Performed by: Cynda Familia, CRNA Pre-anesthesia Checklist: Patient identified, Emergency Drugs available, Suction available, Patient being monitored and Timeout performed Patient Re-evaluated:Patient Re-evaluated prior to induction Oxygen Delivery Method: Circle system utilized Preoxygenation: Pre-oxygenation with 100% oxygen Placement Confirmation: positive ETCO2 and breath sounds checked- equal and bilateral Dental Injury: Teeth and Oropharynx as per pre-operative assessment  Comments: Mask to anes machine

## 2020-05-23 NOTE — Transfer of Care (Signed)
Immediate Anesthesia Transfer of Care Note  Patient: [name]  Procedure(s) Performed: TRACHEOSTOMY (N/A ) DIRECT LARYNGOSCOPY with BIOPSY (N/A )  Patient Location: PACU  Anesthesia Type:MAC and General  Level of Consciousness: awake, alert  and oriented  Airway & Oxygen Therapy: Patient Spontanous Breathing and Patient connected to tracheostomy mask oxygen  Post-op Assessment: Report given to RN, Post -op Vital signs reviewed and stable and Patient moving all extremities X 4  Post vital signs: Reviewed and stable  Last Vitals:  Vitals Value Taken Time  BP 128/107 05/23/20 1130  Temp 36.3 C 05/23/20 1115  Pulse 45 05/23/20 1130  Resp 15 05/23/20 1130  SpO2 95 % 05/23/20 1130  Vitals shown include unvalidated device data.  Last Pain:  Vitals:   05/23/20 0800  TempSrc:   PainSc: Asleep         Complications: No complications documented.

## 2020-05-23 NOTE — OR Nursing (Signed)
Shiley size 6

## 2020-05-24 ENCOUNTER — Inpatient Hospital Stay (HOSPITAL_COMMUNITY): Payer: Medicare Other

## 2020-05-24 ENCOUNTER — Encounter (HOSPITAL_COMMUNITY): Payer: Self-pay | Admitting: Otolaryngology

## 2020-05-24 DIAGNOSIS — I4821 Permanent atrial fibrillation: Secondary | ICD-10-CM

## 2020-05-24 DIAGNOSIS — J84112 Idiopathic pulmonary fibrosis: Secondary | ICD-10-CM | POA: Diagnosis not present

## 2020-05-24 DIAGNOSIS — J387 Other diseases of larynx: Secondary | ICD-10-CM

## 2020-05-24 DIAGNOSIS — D141 Benign neoplasm of larynx: Secondary | ICD-10-CM | POA: Diagnosis not present

## 2020-05-24 DIAGNOSIS — R0602 Shortness of breath: Secondary | ICD-10-CM | POA: Diagnosis not present

## 2020-05-24 DIAGNOSIS — I4891 Unspecified atrial fibrillation: Secondary | ICD-10-CM | POA: Diagnosis not present

## 2020-05-24 LAB — CBC
HCT: 33.2 % — ABNORMAL LOW (ref 39.0–52.0)
Hemoglobin: 11.6 g/dL — ABNORMAL LOW (ref 13.0–17.0)
MCH: 28 pg (ref 26.0–34.0)
MCHC: 34.9 g/dL (ref 30.0–36.0)
MCV: 80.2 fL (ref 80.0–100.0)
Platelets: 173 10*3/uL (ref 150–400)
RBC: 4.14 MIL/uL — ABNORMAL LOW (ref 4.22–5.81)
RDW: 13.4 % (ref 11.5–15.5)
WBC: 16.9 10*3/uL — ABNORMAL HIGH (ref 4.0–10.5)
nRBC: 0 % (ref 0.0–0.2)

## 2020-05-24 LAB — COMPREHENSIVE METABOLIC PANEL
ALT: 11 U/L (ref 0–44)
AST: 14 U/L — ABNORMAL LOW (ref 15–41)
Albumin: 2.7 g/dL — ABNORMAL LOW (ref 3.5–5.0)
Alkaline Phosphatase: 44 U/L (ref 38–126)
Anion gap: 5 (ref 5–15)
BUN: 16 mg/dL (ref 8–23)
CO2: 25 mmol/L (ref 22–32)
Calcium: 8.6 mg/dL — ABNORMAL LOW (ref 8.9–10.3)
Chloride: 110 mmol/L (ref 98–111)
Creatinine, Ser: 0.83 mg/dL (ref 0.61–1.24)
GFR calc Af Amer: >60 mL/min (ref >60)
GFR calc non Af Amer: >60 mL/min (ref >60)
Glucose, Bld: 138 mg/dL — ABNORMAL HIGH (ref 70–99)
Potassium: 3.7 mmol/L (ref 3.5–5.1)
Sodium: 140 mmol/L (ref 135–145)
Total Bilirubin: 0.8 mg/dL (ref 0.3–1.2)
Total Protein: 5.6 g/dL — ABNORMAL LOW (ref 6.5–8.1)

## 2020-05-24 LAB — MAGNESIUM: Magnesium: 2 mg/dL (ref 1.7–2.4)

## 2020-05-24 LAB — GLUCOSE, CAPILLARY: Glucose-Capillary: 126 mg/dL — ABNORMAL HIGH (ref 70–99)

## 2020-05-24 MED ORDER — METOPROLOL TARTRATE 5 MG/5ML IV SOLN
5.0000 mg | Freq: Four times a day (QID) | INTRAVENOUS | Status: DC | PRN
  Administered 2020-05-28: 5 mg via INTRAVENOUS
  Filled 2020-05-24 (×2): qty 5

## 2020-05-24 MED ORDER — SODIUM CHLORIDE 0.9 % IV SOLN
INTRAVENOUS | Status: DC | PRN
  Administered 2020-05-24: 250 mL via INTRAVENOUS

## 2020-05-24 MED ORDER — POTASSIUM CHLORIDE 10 MEQ/100ML IV SOLN
10.0000 meq | INTRAVENOUS | Status: AC
  Administered 2020-05-24 (×2): 10 meq via INTRAVENOUS
  Filled 2020-05-24 (×2): qty 100

## 2020-05-24 NOTE — Progress Notes (Signed)
Dr. Janace Hoard signed out [name] to me today after placing an awake trach and biopsy of supraglottic tumor.  He packed his trach wound early this morning to address ongoing bleeding.  Currently, he is comfortable and without bleeding.  Trach in place - no bleeding  A: Laryngeal CA (T3NxMx)  No swallow study for now due to bleeding concern.  Will likely need PEG tube placed.  Consult Radiation Oncology/Medical Oncology for treatment plan.  Medical care per team.

## 2020-05-24 NOTE — Consult Note (Addendum)
Cardiology Consult    Patient ID: [name] MRN: [id number], DOB/AGE: 1941-03-18   Admit date: 05/21/2020 Date of Consult: 05/24/2020  Primary Physician: Elsie Stain, MD Primary Cardiologist: Had been followed in Afib clinic Requesting Provider: Nena Alexander, MD  Patient Profile    [name] is a 79 y.o. male with a history of persistent afib, etoh abuse, tob abuse, COPD, pulm fibrosis, and seizures, who is being seen today for the evaluation of Afib w/ RVR along with pauses up to 2.61 secs at the request of Dr. Sloan Leiter.  Past Medical History   Past Medical History:  Diagnosis Date  . Alcohol abuse   . Aortic atherosclerosis ([place])    a. 04/2020 noted on CT.  Marland Kitchen Cardiomyopathy ([place])    a. 12/2001 Echo: EF 55-65%; b. 02/2020 Echo: EF 45-50%, glob HK. Mild LVH of the basal segment.  Mildly reduced RV fxn. Nl RVSP. Mild MR. Triv AI.  Marland Kitchen COPD (chronic obstructive pulmonary disease) ([place])   . Persistent atrial fibrillation ([place])   . Seizures ([place])   . Squamous cell carcinoma - supraglottic mass    a. 04/2020 CT Head/neck: 3.3 x 3.7 x 3.0 cm R supraglottic mass consistent w/ squamous cell carcinoma. Bilat lymph nodes concerning for malignancy.    Past Surgical History:  Procedure Laterality Date  . DIRECT LARYNGOSCOPY N/A 05/23/2020   Procedure: DIRECT LARYNGOSCOPY with BIOPSY;  Surgeon: Melissa Montane, MD;  Location: [facility];  Service: ENT;  Laterality: N/A;  . TRACHEOSTOMY TUBE PLACEMENT N/A 05/23/2020   Procedure: TRACHEOSTOMY;  Surgeon: Melissa Montane, MD;  Location: [facility];  Service: ENT;  Laterality: N/A;    Allergies  No Known Allergies  History of Present Illness    79 y/o ? w/ a h/o persistent Afib, etoh abuse, tob abuse, COPD, pulm fibrosis, and seizures.  He was initially dx w/ Afib in 02/2020 after presenting to the ED w/ a persistent cough and was found to be in rate-controlled afib.  At the time, he was palced on a Z-Pak and steroids for presumed URI.  He f/u in Afib  clinic and was placed on eliquis 2.5 mg bid (CHA2DS2VASc = 2).  Given adequate rate control and lack of symptoms, pt/family agreed to ongoing rate control (diltiazem 120mg  daily).  Echo was performed as outpt and showed mild LV dysfxn w/ EF of 45-50% and glob HK.  He was last seen in afib clinic on 7/12.  Unfortunately, he presented to the ED on 7/25 with progressive stridor and dyspnea.  He reported choking on chicken earlier in the day and coughing spells.  ENT was consulted and fiberoptic exam notable for R laryngeal tumor.  CT showed a supraglottic mass, concerning for squamous cell carcinoma.  In that setting, he has been NPO and is s/p trach on 7/25.  He has remained in Afib throughout admission however, rates have been variable, esp in the setting of suctioning and trach care (up to 170).  At rest, rates have trended 70s to 80s despite not receiving scheduled AVN blocking agent x 2 days (1 dose of IV metoprolol given on AM of 7/25).  Pt unable to provide much information in setting of trach, but does report that he can feel when heart is racing.  Over the past 24 hrs, he has had multiple pauses of up to 2.6 seconds.  He denies chest pain, dyspnea, presyncope.    Inpatient Medications    . chlorhexidine  15 mL Mouth Rinse BID  .  Chlorhexidine Gluconate Cloth  6 each Topical Daily  . diltiazem  120 mg Oral Daily  . mouth rinse  15 mL Mouth Rinse q12n4p  . methylPREDNISolone (SOLU-MEDROL) injection  40 mg Intravenous Q12H  . mometasone-formoterol  2 puff Inhalation BID    Family History    Family History  Problem Relation Age of Onset  . Hypertension Other    He indicated that the status of his other is unknown.   Social History    Social History   Socioeconomic History  . Marital status: Divorced    Spouse name: Not on file  . Number of children: Not on file  . Years of education: Not on file  . Highest education level: Not on file  Occupational History  . Not on file  Tobacco  Use  . Smoking status: Former Research scientist (life sciences)  . Smokeless tobacco: Never Used  Substance and Sexual Activity  . Alcohol use: Not Currently    Alcohol/week: 6.0 standard drinks    Types: 6 Cans of beer per week  . Drug use: No  . Sexual activity: Not Currently  Other Topics Concern  . Not on file  Social History Narrative  . Not on file   Social Determinants of Health   Financial Resource Strain:   . Difficulty of Paying Living Expenses:   Food Insecurity:   . Worried About Charity fundraiser in the Last Year:   . Arboriculturist in the Last Year:   Transportation Needs:   . Film/video editor (Medical):   Marland Kitchen Lack of Transportation (Non-Medical):   Physical Activity:   . Days of Exercise per Week:   . Minutes of Exercise per Session:   Stress:   . Feeling of Stress :   Social Connections:   . Frequency of Communication with Friends and Family:   . Frequency of Social Gatherings with Friends and Family:   . Attends Religious Services:   . Active Member of Clubs or Organizations:   . Attends Archivist Meetings:   Marland Kitchen Marital Status:   Intimate Partner Violence:   . Fear of Current or Ex-Partner:   . Emotionally Abused:   Marland Kitchen Physically Abused:   . Sexually Abused:      Review of Systems    General:  No chills, fever, night sweats or weight changes.  Cardiovascular:  No chest pain, +++ dyspnea and stridor on admission.  No edema, orthopnea, palpitations, paroxysmal nocturnal dyspnea. Dermatological: No rash, lesions/masses Respiratory: +++ cough, +++ dyspnea Urologic: No hematuria, dysuria Abdominal:   No nausea, vomiting, diarrhea, bright red blood per rectum, melena, or hematemesis Neurologic:  No visual changes, wkns, changes in mental status. All other systems reviewed and are otherwise negative except as noted above.  Physical Exam    Blood pressure (!) 140/95, pulse 74, temperature 97.8 F (36.6 C), temperature source Oral, resp. rate 12, height 5\' 5"   (1.651 m), weight 46.6 kg, SpO2 99 %.  General: Pleasant, NAD Psych: Flat affect. Neuro: Alert and oriented X 3. Moves all extremities spontaneously. HEENT: Normal  Neck: Supple without bruits or JVD. Trach in place. Lungs:  Resp regular and unlabored, diminished breath sounds bilat. Heart: IR, IR, no s3, s4, or murmurs. Abdomen: Soft, non-tender, non-distended, BS + x 4.  Extremities: No clubbing, cyanosis or edema. DP/PT/Radials 2+ and equal bilaterally.  Labs      Lab Results  Component Value Date   WBC 16.9 (H) 05/24/2020   HGB 11.6 (  L) 05/24/2020   HCT 33.2 (L) 05/24/2020   MCV 80.2 05/24/2020   PLT 173 05/24/2020    Recent Labs  Lab 05/24/20 0119  NA 140  K 3.7  CL 110  CO2 25  BUN 16  CREATININE 0.83  CALCIUM 8.6*  PROT 5.6*  BILITOT 0.8  ALKPHOS 44  ALT 11  AST 14*  GLUCOSE 138*   Lab Results  Component Value Date   CHOL 188 04/17/2013   HDL 102 04/17/2013   LDLCALC 74 04/17/2013   TRIG 62 04/17/2013     Radiology Studies    CT Soft Tissue Neck W Contrast  Result Date: 05/21/2020 CLINICAL DATA:  Right laryngeal mass.  Stridor. EXAM: CT NECK WITH CONTRAST TECHNIQUE: Multidetector CT imaging of the neck was performed using the standard protocol following the bolus administration of intravenous contrast. CONTRAST:  2mL OMNIPAQUE IOHEXOL 300 MG/ML  SOLN COMPARISON:  CT chest 07/10/2016 and 03/25/2020 FINDINGS: Pharynx and larynx: Nasopharynx is clear. Soft palate is within normal limits. Tongue base and palatine tonsils are normal. The epiglottis is within normal limits. Soft tissue tumor fills the right area epiglottic fold. Tumor measures 3.3 x 3.7 x 3.0 cm. Tumor crosses midline posteriorly. Tumor extends at least 5 mm below the right vocal cord. Lower trachea is within normal limits. Salivary glands: Submandibular and parotid glands and ducts are within normal limits. Thyroid: Normal Lymph nodes: Right level 2 rounded nodes measuring 7 and 8 mm raise some  concern for malignancy. Rounded posterior left level 3 lymph nodes are present. Vascular: Atherosclerotic changes are present at the carotid bifurcations bilaterally without significant stenosis. Additional calcifications are present at the origin of the left subclavian artery without significant stenosis. Limited intracranial: Negative. Visualized orbits: The globes and orbits are within normal limits. Mastoids and visualized paranasal sinuses: The paranasal sinuses and mastoid air cells are clear. Skeleton: Multilevel degenerative changes the cervical spine are most severe at C6-7 and C7-T1. 2 body heights are maintained. The patient is edentulous. Upper chest: Centrilobular emphysematous changes are present. Lung apices are otherwise clear. Thoracic inlet is within normal limits. IMPRESSION: 1. 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 Tumor crosses midline posteriorly and extends at least 5 mm below the right vocal cord. 2. Rounded right level 2 and posterior left level 3 lymph nodes are concerning for malignancy. PET scan of the neck would be useful to evaluate for any malignant nodes. 3. Aortic Atherosclerosis (ICD10-I70.0) and Emphysema (ICD10-J43.9). Electronically Signed   By: San Morelle M.D.   On: 05/21/2020 22:00   DG Chest Port 1 View  Result Date: 05/24/2020 CLINICAL DATA:  Leukocytosis EXAM: PORTABLE CHEST 1 VIEW COMPARISON:  May 21, 2020 chest radiograph and chest CT Mar 25, 2020 FINDINGS: Tracheostomy catheter tip is 5.8 cm above the carina. No pneumothorax evident. There is, however, pneumomediastinum as well as subcutaneous air tracking in the supraclavicular regions bilaterally. Fibrotic type changes noted bilaterally, primarily in the mid and lower lung regions. There is no frank edema or airspace opacity. Heart is upper normal in size with pulmonary vascularity normal. No adenopathy. No bone lesions. IMPRESSION: Pneumomediastinum  and supraclavicular soft tissue air of uncertain etiology. No pneumothorax evident. No tracheostomy present. Question pneumomediastinum and subcutaneous air secondary to recent tracheostomy placement. Areas of fibrotic change bilaterally without frank edema or airspace opacity. Stable cardiac silhouette. Electronically Signed   By: Lowella Grip III M.D.   On: 05/24/2020 05:24  DG Chest Port 1 View  Result Date: 05/21/2020 CLINICAL DATA:  Difficulty breathing, COPD, CHF EXAM: PORTABLE CHEST 1 VIEW COMPARISON:  03/04/2020, 03/25/2020 FINDINGS: Single frontal view of the chest demonstrates a stable cardiac silhouette. There are chronic bilateral areas of scarring, with fibrosis greatest at the lung bases left greater than right. No acute airspace disease. No effusion or pneumothorax. No acute bony abnormalities. IMPRESSION: 1. 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 No acute intrathoracic process. Electronically Signed   By: Randa Ngo M.D.   On: 05/21/2020 19:21    ECG & Cardiac Imaging    Afib, 110, LAD, no acute ST/T changes - personally reviewed.  Assessment & Plan    1.  Persistent Atrial Fibrillation:  Dx 02/2020.  Previously rate-controlled on long acting dilt 120mg  daily. Admitted 5/23 w/ dyspnea/stridor and found to have laryngeal mass.  NPO since.  HRs have been variable, but in general, when resting, HRs 70's to 80's.  Per nsg staff w/ trach care/suctioning, rates rise into the 170's.  He has also had pauses of up to 2.61 seconds - all asymptomatic.  Recommend conservative approach.  Higher rates with trach care are to be expected, but would prefer to avoid adding back AVN blocking agent at this time given stable resting rates and freq pauses.  Once stable and taking POs, can consider adding low-dose  blocker and resume [place] (when ok w/ ENT).  With mild LV dysfxn, would avoid CCB.  2.  Essential HTN:  BP variable.  Currently stable.  3.   Laryngeal mass/squamous cell carcinoma:  S/p trach. Per ENT.  4.  Cardiomyopathy:  EF 45-50% in setting of Afib - ? Tachy mediated.  No chest pain or dyspnea.  Euvolemic.  Given complex picture, would not pursue ischemic eval at this time.  Signed, Murray Hodgkins, NP 05/24/2020, 4:13 PM  For questions or updates, please contact   Please consult [email or web address] for contact info under Cardiology/STEMI.   I have seen and examined the patient along with Murray Hodgkins, NP.  I have reviewed the chart, notes and new data.  I agree with PA/NP's note.  Key new complaints: he is a little withdrawn, nit in distress. Communication is limited. Not receiving PO meds Key examination changes: elderly and gaunt, irregular rhythm Key new findings / data: telemetry reviewed. He has spontaneous good ventricular rate control at rest, but develops marked RVR with suctioning or manipulation of his tracheostomy.  PLAN: No pharmacological solution to his episodes of RVR. As long as rate is well controlled the majority of the day (which is the case), would not treat the episodes of RVR. Hopefully, as he becomes more accustomed to trach care, the adrenergic response will be less intense. Unable to give oral anticoagulants at this time. Some clarification regarding cancer treatment plan and prognosis would help [place] make long term recommendations.  Sanda Klein, MD, [facility] [phone] 05/24/2020, 4:36 PM

## 2020-05-24 NOTE — Progress Notes (Addendum)
PROGRESS NOTE        PATIENT DETAILS Name: [name] Age: 79 y.o. Sex: male Date of Birth: 1940/12/09 Admit Date: 05/21/2020 Admitting Physician Bonnielee Haff, MD ONG:[id number], Burnett Harry, MD  Brief Narrative: Patient is a 79 y.o. 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  See below for further details.  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  Antimicrobial therapy: None  Microbiology data: None  Procedures : 7/25>> tracheostomy and direct laryngoscopy  Consults: ENT, PCCM  DVT Prophylaxis : Place and maintain sequential compression device Start: 05/23/20 1013    Subjective: Lying comfortably in bed.  Dried blood around tracheostomy site this morning-did have some bleeding episodes overnight.  Assessment/Plan: Shortness of breath with stridor secondary to supraglottic tumor: S/p tracheostomy on 7/25-biopsy pending-some postoperative bleeding overnight-ENT following and directing care.  Remains n.p.o.-we will await ENT opinion regarding whether we need to attempt oral intake or need alternative method of feeding.  Persistent A. fib with RVR: Some sinus pauses overnight-back in RVR this morning-try IV beta-blocker-if no response-we will place back on Cardizem infusion.  N.p.o. post tracheotomy.  Cardiology consulted.  Anticoagulation on hold due to postoperative bleeding from tracheostomy site-we will resume once cleared for anticoagulation by ENT.  HTN: BP stable-resume oral antihypertensives when able.  History of idiopathic pulmonary fibrosis: Appears  stable-followed by Dr. Joya Gaskins in the outpatient setting-etiology thought to be recurrent aspiration.  Normocytic anemia: Mild-stable for follow-up.  Palliative care discussion: 79 year old with numerous medical problems as outlined above-now with supraglottic tumor-discussed with sister over the phone-she realizes that patient has a long road ahead of him in terms of treatment-she expresses concern that he may not tolerate treatment for laryngeal cancer.  We will go ahead and consult palliative care-(while waiting for biopsy/ENT opinion regarding PEG tube etc)-for goals of care.  Nutrition Problem: Nutrition Problem: Inadequate oral intake Etiology: inability to eat Signs/Symptoms: NPO status Interventions: Refer to RD note for recommendations   Telemetry (independently reviewed): A. fib-less than 2 seconds pauses overnight.  Diet: Diet Order            Diet NPO time specified  Diet effective now                  Code Status: Full code   Family Communication: Sister over [phone]  Disposition Plan: Status is: Inpatient  Remains inpatient appropriate because:Inpatient level of care appropriate due to severity of illness  Dispo:  Patient From: ALF  Planned Disposition: To be determined  Expected discharge date: 05/25/20  Medically stable for discharge: No   Barriers to Discharge:  Antimicrobial agents: Anti-infectives (From admission, onward)   None       Time spent: 35 minutes-Greater than 50% of this time was spent in counseling, explanation of diagnosis, planning of further management, and coordination of care.  MEDICATIONS: Scheduled Meds: . chlorhexidine  15 mL Mouth Rinse BID  . Chlorhexidine Gluconate Cloth  6 each Topical Daily  . diltiazem  120 mg Oral Daily  . mouth rinse  15 mL Mouth Rinse q12n4p  . methylPREDNISolone (SOLU-MEDROL) injection  40  mg Intravenous Q12H  . mometasone-formoterol  2 puff Inhalation BID   Continuous  Infusions: . dextrose 5 % and 0.9% NaCl 50 mL/hr at 05/23/20 1800   PRN Meds:.acetaminophen **OR** acetaminophen, ipratropium-albuterol, ondansetron **OR** ondansetron (ZOFRAN) IV   PHYSICAL EXAM: Vital signs: Vitals:   05/24/20 0500 05/24/20 0600 05/24/20 0700 05/24/20 0731  BP: 117/76 123/74 (!) 136/75   Pulse: 78 83 69   Resp: 14 19 17    Temp:    97.9 F (36.6 C)  TempSrc:    Oral  SpO2: 100% 100% 100%   Weight:      Height:       Filed Weights   05/21/20 1900 05/23/20 2126  Weight: 50 kg 46.6 kg   Body mass index is 17.1 kg/m.   Gen Exam:Alert awake-not in any distress HEENT:atraumatic, normocephalic Chest: B/L clear to auscultation anteriorly CVS:S1S2 regular Abdomen:soft non tender, non distended Extremities:no edema Neurology: Non focal Skin: no rash  I have personally reviewed following labs and imaging studies  LABORATORY DATA: CBC: Recent Labs  Lab 05/21/20 1858 05/22/20 0431 05/24/20 0119  WBC 7.5 5.4 16.9*  NEUTROABS 5.3  --   --   HGB 11.9* 12.2* 11.6*  HCT 33.5* 34.6* 33.2*  MCV 82.9 82.4 80.2  PLT 173 164 885    Basic Metabolic Panel: Recent Labs  Lab 05/21/20 1858 05/22/20 0431 05/24/20 0119  NA 141 138 140  K 3.4* 4.0 3.7  CL 104 103 110  CO2 26 25 25   GLUCOSE 102* 147* 138*  BUN 18 16 16   CREATININE 1.01 0.83 0.83  CALCIUM 9.1 9.1 8.6*  MG  --   --  2.0    GFR: Estimated Creatinine Clearance: 46.8 mL/min (by C-G formula based on SCr of 0.83 mg/dL).  Liver Function Tests: Recent Labs  Lab 05/22/20 0431 05/24/20 0119  AST 14* 14*  ALT 10 11  ALKPHOS 55 44  BILITOT 1.0 0.8  PROT 7.2 5.6*  ALBUMIN 3.9 2.7*   No results for input(s): LIPASE, AMYLASE in the last 168 hours. No results for input(s): AMMONIA in the last 168 hours.  Coagulation Profile: No results for input(s): INR, PROTIME in the last 168 hours.  Cardiac Enzymes: No results for input(s): CKTOTAL, CKMB, CKMBINDEX, TROPONINI in the last 168  hours.  BNP (last 3 results) No results for input(s): PROBNP in the last 8760 hours.  Lipid Profile: No results for input(s): CHOL, HDL, LDLCALC, TRIG, CHOLHDL, LDLDIRECT in the last 72 hours.  Thyroid Function Tests: No results for input(s): TSH, T4TOTAL, FREET4, T3FREE, THYROIDAB in the last 72 hours.  Anemia Panel: No results for input(s): VITAMINB12, FOLATE, FERRITIN, TIBC, IRON, RETICCTPCT in the last 72 hours.  Urine analysis:    Component Value Date/Time   COLORURINE YELLOW 07/14/2015 [place] 07/14/2015 1352   LABSPEC 1.015 07/14/2015 1352   PHURINE 7.5 07/14/2015 1352   GLUCOSEU NEGATIVE 07/14/2015 1352   HGBUR NEGATIVE 07/14/2015 1352   BILIRUBINUR NEGATIVE 07/14/2015 1352   KETONESUR NEGATIVE 07/14/2015 1352   PROTEINUR NEGATIVE 07/14/2015 1352   UROBILINOGEN 2.0 (H) 07/14/2015 1352   NITRITE NEGATIVE 07/14/2015 1352   LEUKOCYTESUR NEGATIVE 07/14/2015 1352    Sepsis Labs: Lactic Acid, Venous No results found for: LATICACIDVEN  MICROBIOLOGY: Recent Results (from the past 240 hour(s))  SARS Coronavirus 2 by RT PCR (hospital order, performed in [facility] hospital lab) Nasopharyngeal Nasopharyngeal Swab     Status: None   Collection Time: 05/21/20  7:19 PM  Specimen: Nasopharyngeal Swab  Result Value Ref Range Status   SARS Coronavirus 2 NEGATIVE NEGATIVE Final    Comment: (NOTE) SARS-CoV-2 target nucleic acids are NOT DETECTED.  The SARS-CoV-2 RNA is generally detectable in upper and lower respiratory specimens during the acute phase of infection. The lowest concentration of SARS-CoV-2 viral copies this assay can detect is 250 copies / mL. A negative result does not preclude SARS-CoV-2 infection and should not be used as the sole basis for treatment or other patient management decisions.  A negative result may occur with improper specimen collection / handling, submission of specimen other than nasopharyngeal swab, presence of viral  mutation(s) within the areas targeted by this assay, and inadequate number of viral copies (<250 copies / mL). A negative result must be combined with clinical observations, patient history, and epidemiological information.  Fact Sheet for Patients:   StrictlyIdeas.no  Fact Sheet for Healthcare Providers: BankingDealers.co.za  This test is not yet approved or  cleared by the [place] FDA and has been authorized for detection and/or diagnosis of SARS-CoV-2 by FDA under an Emergency Use Authorization (EUA).  This EUA will remain in effect (meaning this test can be used) for the duration of the COVID-19 declaration under Section 564(b)(1) of the Act, 21 U.S.C. section 360bbb-3(b)(1), unless the authorization is terminated or revoked sooner.  Performed at [facility], [place] [address] [place], [place] [address]   MRSA PCR Screening     Status: None   Collection Time: 05/22/20  8:54 PM   Specimen: Nasal Mucosa; Nasopharyngeal  Result Value Ref Range Status   MRSA by PCR NEGATIVE NEGATIVE Final    Comment:        The GeneXpert MRSA Assay (FDA approved for NASAL specimens only), is one component of a comprehensive MRSA colonization surveillance program. It is not intended to diagnose MRSA infection nor to guide or monitor treatment for MRSA infections. Performed at [facility], [place] [address] [place], [place] [address]     RADIOLOGY STUDIES/RESULTS: DG Chest Port 1 View  Result Date: 05/24/2020 CLINICAL DATA:  Leukocytosis EXAM: PORTABLE CHEST 1 VIEW COMPARISON:  May 21, 2020 chest radiograph and chest CT Mar 25, 2020 FINDINGS: Tracheostomy catheter tip is 5.8 cm above the carina. No pneumothorax evident. There is, however, pneumomediastinum as well as subcutaneous air tracking in the supraclavicular regions bilaterally. Fibrotic type changes noted bilaterally, primarily in the mid and  lower lung regions. There is no frank edema or airspace opacity. Heart is upper normal in size with pulmonary vascularity normal. No adenopathy. No bone lesions. IMPRESSION: Pneumomediastinum and supraclavicular soft tissue air of uncertain etiology. No pneumothorax evident. No tracheostomy present. Question pneumomediastinum and subcutaneous air secondary to recent tracheostomy placement. Areas of fibrotic change bilaterally without frank edema or airspace opacity. Stable cardiac silhouette. Electronically Signed   By: Lowella Grip III M.D.   On: 05/24/2020 05:24     LOS: 3 days   Oren Binet, MD  [facility]    To contact the attending provider between 7A-7P or the covering provider during after hours 7P-7A, please log into the web site [email or web address] and access using universal [place] password for that web site. If you do not have the password, please call the hospital operator.  05/24/2020, 10:07 AM

## 2020-05-24 NOTE — TOC Initial Note (Signed)
Transition of Care [facility]) - Initial/Assessment Note    Patient Details  Name: [name] MRN: [id number] Date of Birth: [birth year]  Transition of Care [facility]) CM/SW Contact:    Bartholomew Crews, RN Phone Number: 05/24/2020, 3:29 PM  Clinical Narrative:                  Chart reviewed. Patient from [place]. Noted trach yesterday, and biopsies pending for laryngeal tumor. NCM contacted CNS, Laurena Spies, to follow for assistance with possible discharge needs. TOC following for transition needs.   Expected Discharge Plan: Assisted Living Barriers to Discharge: Continued Medical Work up   Patient Goals and CMS Choice        Expected Discharge Plan and Services Expected Discharge Plan: Assisted Living                                              Prior Living Arrangements/Services                       Activities of Daily Living Home Assistive Devices/Equipment: Eyeglasses ADL Screening (condition at time of admission) Patient's cognitive ability adequate to safely complete daily activities?: Yes Is the patient deaf or have difficulty hearing?: Yes Does the patient have difficulty seeing, even when wearing glasses/contacts?: No Does the patient have difficulty concentrating, remembering, or making decisions?: No Patient able to express need for assistance with ADLs?: Yes Does the patient have difficulty dressing or bathing?: Yes Independently performs ADLs?: No Communication: Needs assistance Is this a change from baseline?: Pre-admission baseline Dressing (OT): Needs assistance Is this a change from baseline?: Pre-admission baseline Grooming: Needs assistance Is this a change from baseline?: Change from baseline, expected to last >3 days Feeding: Needs assistance Is this a change from baseline?: Change from baseline, expected to last >3 days Bathing: Needs assistance Is this a change from baseline?: Change from baseline, expected to last >3  days Toileting: Needs assistance Is this a change from baseline?: Change from baseline, expected to last >3days In/Out Bed: Needs assistance Is this a change from baseline?: Pre-admission baseline Walks in Home: Needs assistance Is this a change from baseline?: Pre-admission baseline Does the patient have difficulty walking or climbing stairs?: Yes Weakness of Legs: Both Weakness of Arms/Hands: Both  Permission Sought/Granted                  Emotional Assessment              Admission diagnosis:  Stridor [R06.1] SOB (shortness of breath) [R06.02] 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 Supraglottic mass 05/22/2020  . SOB (shortness of breath) 05/21/2020  . Stridor 05/21/2020  . Benign tumor of epiglottis (suprahyoid portion) 05/21/2020  . IPF (idiopathic pulmonary fibrosis) ([place]) 03/18/2020  . Weight loss 03/18/2020  . Atrial fibrillation ([place]) 03/10/2020  . Secondary hypercoagulable state ([place]) 03/10/2020   PCP:  Elsie Stain, MD Pharmacy:   [id number] [place], [place] [address] [place] [address] Phone: [phone] Fax: [phone]  [place], [place] [address] Phone: [phone] Fax: [phone]     Social Determinants of Health (SDOH) Interventions    Readmission Risk Interventions No  flowsheet data found.

## 2020-05-24 NOTE — Progress Notes (Signed)
SLP Cancellation Note  Patient Details Name: [name] MRN: [id number] DOB: Sep 10, 1941   Cancelled treatment:        Orders for swallow eval and PMV assessment received and appreciated. Spoke with ENT [place] and pt is not medically ready at this time.  Please reconsult speech therapy when pt is medically appropriate for evaluation.   Celedonio Savage, [place], [place] Office: [phone] 05/24/2020, 10:07 AM

## 2020-05-25 ENCOUNTER — Inpatient Hospital Stay (HOSPITAL_COMMUNITY): Payer: Medicare Other

## 2020-05-25 ENCOUNTER — Encounter (HOSPITAL_COMMUNITY): Payer: Self-pay | Admitting: Internal Medicine

## 2020-05-25 DIAGNOSIS — R1310 Dysphagia, unspecified: Secondary | ICD-10-CM

## 2020-05-25 DIAGNOSIS — I4891 Unspecified atrial fibrillation: Secondary | ICD-10-CM | POA: Diagnosis not present

## 2020-05-25 DIAGNOSIS — D141 Benign neoplasm of larynx: Secondary | ICD-10-CM | POA: Diagnosis not present

## 2020-05-25 DIAGNOSIS — Z7189 Other specified counseling: Secondary | ICD-10-CM

## 2020-05-25 DIAGNOSIS — I4821 Permanent atrial fibrillation: Secondary | ICD-10-CM | POA: Diagnosis not present

## 2020-05-25 DIAGNOSIS — R061 Stridor: Secondary | ICD-10-CM | POA: Diagnosis not present

## 2020-05-25 DIAGNOSIS — Z515 Encounter for palliative care: Secondary | ICD-10-CM

## 2020-05-25 DIAGNOSIS — J84112 Idiopathic pulmonary fibrosis: Secondary | ICD-10-CM | POA: Diagnosis not present

## 2020-05-25 LAB — COMPREHENSIVE METABOLIC PANEL
ALT: 11 U/L (ref 0–44)
AST: 14 U/L — ABNORMAL LOW (ref 15–41)
Albumin: 2.7 g/dL — ABNORMAL LOW (ref 3.5–5.0)
Alkaline Phosphatase: 45 U/L (ref 38–126)
Anion gap: 8 (ref 5–15)
BUN: 15 mg/dL (ref 8–23)
CO2: 24 mmol/L (ref 22–32)
Calcium: 8.5 mg/dL — ABNORMAL LOW (ref 8.9–10.3)
Chloride: 107 mmol/L (ref 98–111)
Creatinine, Ser: 0.91 mg/dL (ref 0.61–1.24)
GFR calc Af Amer: >60 mL/min (ref >60)
GFR calc non Af Amer: >60 mL/min (ref >60)
Glucose, Bld: 138 mg/dL — ABNORMAL HIGH (ref 70–99)
Potassium: 3.9 mmol/L (ref 3.5–5.1)
Sodium: 139 mmol/L (ref 135–145)
Total Bilirubin: 0.9 mg/dL (ref 0.3–1.2)
Total Protein: 5.7 g/dL — ABNORMAL LOW (ref 6.5–8.1)

## 2020-05-25 LAB — CBC
HCT: 33.3 % — ABNORMAL LOW (ref 39.0–52.0)
Hemoglobin: 12.1 g/dL — ABNORMAL LOW (ref 13.0–17.0)
MCH: 29.2 pg (ref 26.0–34.0)
MCHC: 36.3 g/dL — ABNORMAL HIGH (ref 30.0–36.0)
MCV: 80.2 fL (ref 80.0–100.0)
Platelets: 259 10*3/uL (ref 150–400)
RBC: 4.15 MIL/uL — ABNORMAL LOW (ref 4.22–5.81)
RDW: 13.4 % (ref 11.5–15.5)
WBC: 16.5 10*3/uL — ABNORMAL HIGH (ref 4.0–10.5)
nRBC: 0 % (ref 0.0–0.2)

## 2020-05-25 LAB — MAGNESIUM: Magnesium: 1.9 mg/dL (ref 1.7–2.4)

## 2020-05-25 LAB — GLUCOSE, CAPILLARY: Glucose-Capillary: 136 mg/dL — ABNORMAL HIGH (ref 70–99)

## 2020-05-25 MED ORDER — ORAL CARE MOUTH RINSE
15.0000 mL | Freq: Two times a day (BID) | OROMUCOSAL | Status: DC
  Administered 2020-05-25 – 2020-06-03 (×11): 15 mL via OROMUCOSAL

## 2020-05-25 MED ORDER — CHLORHEXIDINE GLUCONATE 0.12 % MT SOLN
15.0000 mL | Freq: Two times a day (BID) | OROMUCOSAL | Status: DC
  Administered 2020-05-25 – 2020-06-03 (×17): 15 mL via OROMUCOSAL
  Filled 2020-05-25 (×17): qty 15

## 2020-05-25 NOTE — Progress Notes (Addendum)
Modified Barium Swallow Progress Note  Patient Details  Name: [name] MRN: [id number] Date of Birth: [birth year]  Today's Date: 05/25/2020  Modified Barium Swallow completed.  Full report located under Chart Review in the Imaging Section.  Brief recommendations include the following:  Clinical Impression  Despite presence of a supraglottic/epiglottic mass, pt exhibited mild pharyngeal dysphagia marked by minimal silent aspiration of thin on one occasion. He has a trach and ENT advised againg Passy-Muir speaking valve presently. His laryngeal elevation and anterior hyoid excurison is decreased and mass appears to obstruct laryngeal vestibule and in turn appears to prevent greater penetration/aspiration. There was mild-moderate vallecular residue consistently. Minimal amount of barium, seen close to and below vocal folds, possibly entered after the initial swallow. Pt began to have tachycardia, which RN was aware of. Towards end of study SAWT recommended pt go back to room and no strategies were attempted. Recommend regular texture and thin liquids, crushed meds. Therapist was made aware of pt's disposition plans to hospice today.          ADDENDUM: Pt/family has decided on radiation therapy- pt not going to hospice. Will downgrade texture to Dys 2, continue thin liquids, crush pills, sit upright. ST will continue.   Swallow Evaluation Recommendations       SLP Diet Recommendations: Regular solids;Thin liquid (pt going to hospice)   Liquid Administration via: Cup;Straw   Medication Administration: Crushed with puree   Supervision: Staff to assist with self feeding;Full supervision/cueing for compensatory strategies   Compensations: Slow rate;Small sips/bites   Postural Changes: Seated upright at 90 degrees   Oral Care Recommendations: Oral care BID        [name] 05/25/2020,1:14 PM   Orbie Pyo [place].Ed Risk analyst  [phone] Office [phone]

## 2020-05-25 NOTE — Progress Notes (Addendum)
Manufacturing engineer [facility]) [name]  Referral received for residential hospice at [facility].  Spoke with [name], NOK and sister, she confirmed interested.  Updated her that we have a bed and can accept him today.  Necessary consents have to be obtained prior to him transporting to [facility].  [employer] social worker will arrange a time to complete these forms with [name] hopefully within the next few hours.  ACC will update TOC manager once forms are complete, then transport can be arranged.  RN staff, please call report to [phone] at any time.  Bed assignment is provided at this time.  Once complete, please fax d/c summary to [place] RN, BSN, [facility] Liaison   **  family arrived at [facility] to complete consents for EOL care. Upon arrival, [name] advised that they wanted to pursue PEG placement and further aggressive treatments. **

## 2020-05-25 NOTE — Progress Notes (Addendum)
PROGRESS NOTE        PATIENT DETAILS Name: [name] Age: 79 y.o. Sex: male Date of Birth: Jun 02, 1941 Admit Date: 05/21/2020 Admitting Physician Bonnielee Haff, MD EXN:[id number], Burnett Harry, MD  Brief Narrative: Patient is a 79 y.o. 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  See below for further details.  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  Assessment/Plan: Shortness of breath with stridor secondary to supraglottic tumor: S/p tracheostomy with laryngeal biopsy on 4/94-[id number] course complicated by bleeding that has now resolved.  Biopsy showing squamous cell carcinoma.  ENT following-we will go ahead and consult oncology today.  Persistent A. fib with RVR: Rate controlled this morning-once passes swallow evaluation-we can resume Cardizem.  Discussed with ENT-Dr. Marcelline Deist this morning-due to bleeding risk-recommendations are to hold anticoagulation for 1 week from 7/25-if no bleeding-okay to resume anticoagulation.   Dysphagia: Secondary to laryngeal mass-spoke with SLP-recommendations are for a dysphagia 2 diet.  May require PEG tube placement at some point-we will follow.   HTN: BP stable-resume oral  antihypertensives when able.  History of idiopathic pulmonary fibrosis: Appears stable-followed by Dr. Joya Gaskins in the outpatient setting-etiology thought to be recurrent aspiration.  Normocytic anemia: Mild-stable for follow-up.  Debility/deconditioning: Persistent patient's history-has been gradually declining in function over the past few months-now at ALF-we will obtain PT/OT eval.  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  Appreciate palliative care evaluation-initially family wanted to proceed with comfort measures-but after further discussion-they are now willing to consider radiation/chemotx  Nutrition Problem: Nutrition Problem: Inadequate oral intake Etiology: inability to eat Signs/Symptoms: NPO status Interventions: Refer to RD note for recommendations   Diet: Diet Order            Diet regular Room service appropriate? Yes; Fluid consistency: Thin  Diet effective now                  Code Status: Full code   Family Communication: Sister over [phone] on 7/26-we will update over the next few days.  Disposition Plan: Status is: Inpatient  Remains inpatient appropriate because:Inpatient level of care appropriate due to severity of illness  Dispo:  Patient From: ALF  Planned Disposition: To be determined  Expected discharge date: 05/25/20  Medically stable for discharge: No   Barriers to Discharge: Stridor due to laryngeal mass-s/p tracheotomy-postoperative course complicated by bleeding-further evaluation in progress.  Antimicrobial agents: Anti-infectives (From admission, onward)   None       Time spent: 35 minutes-Greater than 50% of this time was spent in counseling, explanation  of diagnosis, planning of further  management, and coordination of care.  MEDICATIONS: Scheduled Meds:  chlorhexidine  15 mL Mouth Rinse BID   Chlorhexidine Gluconate Cloth  6 each Topical Daily   diltiazem  120 mg Oral Daily   mouth rinse  15 mL Mouth Rinse q12n4p   methylPREDNISolone (SOLU-MEDROL) injection  40 mg Intravenous Q12H   mometasone-formoterol  2 puff Inhalation BID   Continuous Infusions:  sodium chloride Stopped (05/24/20 1450)   dextrose 5 % and 0.9% NaCl Stopped (05/25/20 1026)   PRN Meds:.sodium chloride, acetaminophen **OR** acetaminophen, ipratropium-albuterol, metoprolol tartrate, ondansetron **OR** ondansetron (ZOFRAN) IV   PHYSICAL EXAM: Vital signs: Vitals:   05/25/20 1100 05/25/20 1152 05/25/20 1200 05/25/20 1300  BP: (!) 132/100  (!) 135/119 (!) 130/75  Pulse: 76  82 98  Resp: 23  22 18   Temp:  98.2 F (36.8 C)    TempSrc:  Oral    SpO2: 100%  97% 97%  Weight:      Height:       Filed Weights   05/21/20 1900 05/23/20 2126 05/24/20 2101  Weight: 50 kg 46.6 kg 45.8 kg   Body mass index is 16.8 kg/m.   Gen Exam:Alert awake-not in any distress HEENT:atraumatic, normocephalic Chest: B/L clear to auscultation anteriorly CVS:S1S2 regular Abdomen:soft non tender, non distended Extremities:no edema Neurology: Non focal Skin: no rash  I have personally reviewed following labs and imaging studies  LABORATORY DATA: CBC: Recent Labs  Lab 05/21/20 1858 05/22/20 0431 05/24/20 0119 05/25/20 0316  WBC 7.5 5.4 16.9* 16.5*  NEUTROABS 5.3  --   --   --   HGB 11.9* 12.2* 11.6* 12.1*  HCT 33.5* 34.6* 33.2* 33.3*  MCV 82.9 82.4 80.2 80.2  PLT 173 164 173 466    Basic Metabolic Panel: Recent Labs  Lab 05/21/20 1858 05/22/20 0431 05/24/20 0119 05/25/20 0316  NA 141 138 140 139  K 3.4* 4.0 3.7 3.9  CL 104 103 110 107  CO2 26 25 25 24   GLUCOSE 102* 147* 138* 138*  BUN 18 16 16 15   CREATININE 1.01 0.83 0.83 0.91  CALCIUM 9.1 9.1  8.6* 8.5*  MG  --   --  2.0 1.9    GFR: Estimated Creatinine Clearance: 41.9 mL/min (by C-G formula based on SCr of 0.91 mg/dL).  Liver Function Tests: Recent Labs  Lab 05/22/20 0431 05/24/20 0119 05/25/20 0316  AST 14* 14* 14*  ALT 10 11 11   ALKPHOS 55 44 45  BILITOT 1.0 0.8 0.9  PROT 7.2 5.6* 5.7*  ALBUMIN 3.9 2.7* 2.7*   No results for input(s): LIPASE, AMYLASE in the last 168 hours. No results for input(s): AMMONIA in the last 168 hours.  Coagulation Profile: No results for input(s): INR, PROTIME in the last 168 hours.  Cardiac Enzymes: No results for input(s): CKTOTAL, CKMB, CKMBINDEX, TROPONINI in the last 168 hours.  BNP (last 3 results) No results for input(s): PROBNP in the last 8760 hours.  Lipid Profile: No results for input(s): CHOL, HDL, LDLCALC, TRIG, CHOLHDL, LDLDIRECT in the last 72 hours.  Thyroid Function Tests: No results for input(s): TSH, T4TOTAL, FREET4, T3FREE, THYROIDAB in the last 72 hours.  Anemia Panel: No results for input(s): VITAMINB12, FOLATE, FERRITIN, TIBC, IRON, RETICCTPCT in the last 72 hours.  Urine analysis:    Component Value Date/Time   COLORURINE YELLOW 07/14/2015 [place] 07/14/2015 1352   LABSPEC 1.015 07/14/2015 1352   PHURINE 7.5 07/14/2015 1352   GLUCOSEU NEGATIVE 07/14/2015 1352  HGBUR NEGATIVE 07/14/2015 1352   [place] 07/14/2015 1352   KETONESUR NEGATIVE 07/14/2015 1352   PROTEINUR NEGATIVE 07/14/2015 1352   UROBILINOGEN 2.0 (H) 07/14/2015 1352   NITRITE NEGATIVE 07/14/2015 1352   LEUKOCYTESUR NEGATIVE 07/14/2015 1352    Sepsis Labs: Lactic Acid, Venous No results found for: LATICACIDVEN  MICROBIOLOGY: Recent Results (from the past 240 hour(s))  SARS Coronavirus 2 by RT PCR (hospital order, performed in [place] hospital lab) Nasopharyngeal Nasopharyngeal Swab     Status: None   Collection Time: 05/21/20  7:19 PM   Specimen: Nasopharyngeal Swab  Result Value Ref Range  Status   SARS Coronavirus 2 NEGATIVE NEGATIVE Final    Comment: (NOTE) SARS-CoV-2 target nucleic acids are NOT DETECTED.  The SARS-CoV-2 RNA is generally detectable in upper and lower respiratory specimens during the acute phase of infection. The lowest concentration of SARS-CoV-2 viral copies this assay can detect is 250 copies / mL. A negative result does not preclude SARS-CoV-2 infection and should not be used as the sole basis for treatment or other patient management decisions.  A negative result may occur with improper specimen collection / handling, submission of specimen other than nasopharyngeal swab, presence of viral mutation(s) within the areas targeted by this assay, and inadequate number of viral copies (<250 copies / mL). A negative result must be combined with clinical observations, patient history, and epidemiological information.  Fact Sheet for Patients:   StrictlyIdeas.no  Fact Sheet for Healthcare Providers: BankingDealers.co.za  This test is not yet approved or  cleared by the [place] FDA and has been authorized for detection and/or diagnosis of SARS-CoV-2 by FDA under an Emergency Use Authorization (EUA).  This EUA will remain in effect (meaning this test can be used) for the duration of the COVID-19 declaration under Section 564(b)(1) of the Act, 21 U.S.C. section 360bbb-3(b)(1), unless the authorization is terminated or revoked sooner.  Performed at [facility], [place] [address], [place], [place] [address]   MRSA PCR Screening     Status: None   Collection Time: 05/22/20  8:54 PM   Specimen: Nasal Mucosa; Nasopharyngeal  Result Value Ref Range Status   MRSA by PCR NEGATIVE NEGATIVE Final    Comment:        The GeneXpert MRSA Assay (FDA approved for NASAL specimens only), is one component of a comprehensive MRSA colonization surveillance program. It is not intended to diagnose  MRSA infection nor to guide or monitor treatment for MRSA infections. Performed at [facility], [place] [address] [place], [place] [address]     RADIOLOGY STUDIES/RESULTS: DG Chest Port 1 View  Result Date: 05/24/2020 CLINICAL DATA:  Leukocytosis EXAM: PORTABLE CHEST 1 VIEW COMPARISON:  May 21, 2020 chest radiograph and chest CT Mar 25, 2020 FINDINGS: Tracheostomy catheter tip is 5.8 cm above the carina. No pneumothorax evident. There is, however, pneumomediastinum as well as subcutaneous air tracking in the supraclavicular regions bilaterally. Fibrotic type changes noted bilaterally, primarily in the mid and lower lung regions. There is no frank edema or airspace opacity. Heart is upper normal in size with pulmonary vascularity normal. No adenopathy. No bone lesions. IMPRESSION: Pneumomediastinum and supraclavicular soft tissue air of uncertain etiology. No pneumothorax evident. No tracheostomy present. Question pneumomediastinum and subcutaneous air secondary to recent tracheostomy placement. Areas of fibrotic change bilaterally without frank edema or airspace opacity. Stable cardiac silhouette. Electronically Signed   By: Lowella Grip III M.D.   On: 05/24/2020 05:24   DG  Swallowing Func-Speech Pathology  Result Date: 05/25/2020 Objective Swallowing Evaluation: Type of Study: MBS-Modified Barium Swallow Study  Patient Details Name: Reid Regas MRN: [id number] Date of Birth: 24-Oct-1941 Today's Date: 05/25/2020 Time: SLP Start Time (ACUTE ONLY): 1045 -SLP Stop Time (ACUTE ONLY): 1103 SLP Time Calculation (min) (ACUTE ONLY): 18 min Past Medical History: Past Medical History: Diagnosis Date  Alcohol abuse   Aortic atherosclerosis ([place])   a. 04/2020 noted on CT.  Cardiomyopathy ([place])   a. 12/2001 Echo: EF 55-65%; b. 02/2020 Echo: EF 45-50%, glob HK. Mild LVH of the basal segment.  Mildly reduced RV fxn. Nl RVSP. Mild MR. Triv AI.  COPD (chronic obstructive pulmonary disease)  (HCC)   Persistent atrial fibrillation (HCC)   Seizures (HCC)   Squamous cell carcinoma - supraglottic mass   a. 04/2020 CT Head/neck: 3.3 x 3.7 x 3.0 cm R supraglottic mass consistent w/ squamous cell carcinoma. Bilat lymph nodes concerning for malignancy. Past Surgical History: Past Surgical History: Procedure Laterality Date  DIRECT LARYNGOSCOPY N/A 05/23/2020  Procedure: DIRECT LARYNGOSCOPY with BIOPSY;  Surgeon: Melissa Montane, MD;  Location: [place];  Service: ENT;  Laterality: N/A;  TRACHEOSTOMY TUBE PLACEMENT N/A 05/23/2020  Procedure: TRACHEOSTOMY;  Surgeon: Melissa Montane, MD;  Location: [place];  Service: ENT;  Laterality: N/A; HPI:  Patient is a 79 y.o. 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 He has a trach and ENT advised againg Passy-Muir speaking valve presently. His laryngeal elevation and anterior hyoid excurison is decreased and mass appears to obstruct laryngeal vestibule and in turn appears to prevent greater penetration/aspiration. There was mild-moderate vallecular residue consistently. Minimal amount of barium, seen close to and below vocal folds, possibly entered after the initial swallow. Pt began to have tachycardia, which RN was aware of. Towards end of study SAWT recommended pt go back to room and no strategies were attempted. Recommend regular texture and thin liquids, crushed meds. Therapist was made aware of pt's disposition plans to hospice today.        SLP Visit Diagnosis Dysphagia, pharyngeal phase (R13.13) Attention and concentration deficit following --  Frontal lobe and executive function deficit following -- Impact on safety and function Mild aspiration risk;Moderate aspiration risk   CHL IP TREATMENT RECOMMENDATION 05/25/2020 Treatment Recommendations Other (Comment)   Prognosis 07/12/2016 Prognosis for Safe Diet Advancement Fair Barriers to Reach Goals Cognitive deficits Barriers/Prognosis Comment -- CHL IP DIET RECOMMENDATION 05/25/2020 SLP Diet Recommendations Regular solids;Thin liquid Liquid Administration via Cup;Straw Medication Administration Crushed with puree Compensations Slow rate;Small sips/bites Postural Changes Seated upright at 90 degrees   CHL IP OTHER RECOMMENDATIONS 05/25/2020 Recommended Consults -- Oral Care Recommendations Oral care BID Other Recommendations --   CHL IP FOLLOW UP RECOMMENDATIONS 05/25/2020 Follow up Recommendations None   CHL IP FREQUENCY AND DURATION 07/12/2016 Speech Therapy Frequency (ACUTE ONLY) min 2x/week Treatment Duration 1 week      CHL IP ORAL PHASE 05/25/2020 Oral Phase WFL Oral - Pudding Teaspoon -- Oral - Pudding Cup -- Oral - Honey Teaspoon -- Oral - Honey Cup -- Oral - Nectar Teaspoon -- Oral - Nectar Cup -- Oral - Nectar Straw -- Oral - Thin Teaspoon -- Oral -  Thin Cup -- Oral - Thin Straw -- Oral - Puree -- Oral - Mech Soft -- Oral - Regular -- Oral - Multi-Consistency -- Oral - Pill -- Oral Phase - Comment --  CHL IP PHARYNGEAL PHASE 05/25/2020 Pharyngeal Phase Impaired Pharyngeal- Pudding Teaspoon -- Pharyngeal -- Pharyngeal- Pudding Cup -- Pharyngeal -- Pharyngeal- Honey Teaspoon -- Pharyngeal -- Pharyngeal- Honey Cup -- Pharyngeal -- Pharyngeal- Nectar Teaspoon Delayed swallow initiation-vallecula;Pharyngeal residue - valleculae;Reduced laryngeal elevation;Reduced anterior laryngeal mobility Pharyngeal -- Pharyngeal- Nectar Cup Pharyngeal residue - valleculae;Reduced anterior laryngeal mobility;Reduced laryngeal elevation Pharyngeal -- Pharyngeal- Nectar Straw -- Pharyngeal -- Pharyngeal- Thin Teaspoon --  Pharyngeal -- Pharyngeal- Thin Cup Pharyngeal residue - valleculae;Penetration/Apiration after swallow;Penetration/Aspiration during swallow;Reduced laryngeal elevation;Reduced anterior laryngeal mobility Pharyngeal Material enters airway, passes BELOW cords without attempt by patient to eject out (silent aspiration) Pharyngeal- Thin Straw Pharyngeal residue - valleculae Pharyngeal -- Pharyngeal- Puree -- Pharyngeal -- Pharyngeal- Mechanical Soft -- Pharyngeal -- Pharyngeal- Regular Pharyngeal residue - valleculae Pharyngeal -- Pharyngeal- Multi-consistency -- Pharyngeal -- Pharyngeal- Pill -- Pharyngeal -- Pharyngeal Comment --  CHL IP CERVICAL ESOPHAGEAL PHASE 05/25/2020 Cervical Esophageal Phase (No Data) Pudding Teaspoon -- Pudding Cup -- Honey Teaspoon -- Honey Cup -- Nectar Teaspoon -- Nectar Cup -- Nectar Straw -- Thin Teaspoon -- Thin Cup -- Thin Straw -- Puree -- Mechanical Soft -- Regular -- Multi-consistency -- Pill -- Cervical Esophageal Comment -- [name] 05/25/2020, 1:13 PM                LOS: 4 days   Oren Binet, MD  [facility]    To contact the attending provider between 7A-7P or the covering provider during after hours 7P-7A, please log into the web site [email or web address] and access using universal  password for that web site. If you do not have the password, please call the hospital operator.  05/25/2020, 2:29 PM

## 2020-05-25 NOTE — Consult Note (Addendum)
Consultation Note Date: 05/25/2020   Patient Name: [name]  DOB: [birth year]  MRN: [id number]  Age / Sex: 79 y.o., male  PCP: Terry Stain, MD Referring Physician: Jonetta Osgood, MD  Reason for Consultation: Establishing goals of care  HPI/Patient Profile: 79 y.o. 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  05/23/20 tracheostomy performed  Patient and family face treatment option decisions, advanced directive decisions, and anticipatory care needs.   Clinical Assessment and Goals of Care: I have reviewed medical records including EPIC notes, labs and imaging. Received report from primary RN. RN had no acute concerns - states he seems confused. Spoke with Dr. Sloan Carter - plan is for swallow eval, radiation oncology consult, and IR PEG placement today.   Went to visit patient at bedside - no family present. Patient was lying in bed asleep - woke up to voice and gentle touch. The patient had tracheostomy in place - yes/no questions were asked. When asked if he knew where he was, he shook his head no. When asked if he knew why he was in the hospital, he shook his head no. He gestured no when asked if he was in pain. No gestures or non-verbal signs of pain or discomfort noted. He had no signs of difficulty breathing. He was not able to participate in [place] conversation or complex medical decision making.   Called his sister/[name] to discuss diagnosis, prognosis, GOC, EOL wishes, disposition, and options.  I introduced Palliative Medicine as specialized medical care for people living with serious illness. It focuses on providing relief from the symptoms and stress of a serious illness. The goal is to improve quality of life for  both the patient and the family.  We discussed a brief life review of the patient. [name] was described as someone who was very active. He enjoyed working in the yard and in his garden. He had 6 siblings - 2 brothers who are deceased and 4 sisters all living. Terry is the main sibling that has been caring for [name] during his health decline. [name] has 1 daughter and 1 son - Terry states that they do not talk to [name].  [name] lived with Terry/sister for two years. [name]'s health started to decline and two months ago he became a resident of [facility]. As far as functional and nutritional status, when he lived with Terry she states he was walking, taking out the trash, doing yard work, and eating relatively well. She stated his breathing and eating issues have gotten worse over the last two to three months.  We discussed patient's current illness and what it means in the larger context of patient's on-going co-morbidities. Natural disease trajectory and expectations at EOL were discussed. I attempted to elicit values and goals of care important to the patient and family. The difference between aggressive medical intervention and  comfort care was considered in light of the patient's goals of care.   Terry had a clear understanding of [name]'s current medical condition. Discussion was had with Terry about radiation and PEG tube options - she stated that she does not want to put [name] through either of those interventions. Hospice support was discussed and Terry was interested in residential hospice - she understood this meant cancer treatments would not be pursued and we would focus on his comfort. She stated she wanted to "keep him comfortable until God comes for him."  Concepts specific to code status and artificial feeding were considered and discussed. Medical recommendation was given for DNR/DNI in light of the patient's current medical condition  with education provided - Terry was agreeable to DNR/DNI. She did not want PEG tube placed.   Discussed with patient/family the importance of continued conversation with family and the medical providers regarding overall plan of care and treatment options, ensuring decisions are within the context of the patient's values and GOCs.    Questions and concerns were addressed. The family was encouraged to call with questions or concerns.     OTHER [place] - sister  SUMMARY OF RECOMMENDATIONS   -Sister/Terry wants to transition to comfort measures -No escalation of care -DNR/DNI initiated -Terry interested in residential hospice - [facility] and hospice liaison notified; [facility] consult placed -PMT will continue to follow holistically  Addendum: Had a detailed conversation with Dr. Marcelline Carter regarding patient's current medical situation and his recommended treatment options.  Reached out to Terry/sister to review updated information. She was still very hesitant to put patient through radiation treatment. After long discussion of disease trajectory and options, with all questions answered, she stated she was willing to proceed with radiation and PEG tube. Interdisciplinary team notified.  -Continue current medical treatment -Continue DNR/DNI -Terry is agreeable to radiation and PEG tube for patient -PMT will continue to follow holistically   Code Status/Advance Care Planning:  DNR  Palliative Prophylaxis:   Aspiration, Bowel Regimen, Frequent Pain Assessment, Oral Care and Turn Reposition  Additional Recommendations (Limitations, Scope, Preferences):  Avoid Hospitalization and Minimize Medications  Psycho-social/Spiritual:    Created space and opportunity for patient and family to express thoughts and feelings regarding patient's current medical situation.   Emotional support provided.  Prognosis:   < 2 weeks  Discharge Planning: Hospice facility      Primary  Diagnoses: Present on Admission: . SOB (shortness of breath) . Atrial fibrillation ([place]) . IPF (idiopathic pulmonary fibrosis) ([place]) . Stridor . Benign tumor of epiglottis (suprahyoid portion) . Supraglottic mass   I have reviewed the medical record, interviewed the patient and family, and examined the patient. The following aspects are pertinent.  Past Medical History:  Diagnosis Date  . Alcohol abuse   . Aortic atherosclerosis ([place])    a. 04/2020 noted on CT.  Marland Kitchen Cardiomyopathy ([place])    a. 12/2001 Echo: EF 55-65%; b. 02/2020 Echo: EF 45-50%, glob HK. Mild LVH of the basal segment.  Mildly reduced RV fxn. Nl RVSP. Mild MR. Triv AI.  Marland Kitchen COPD (chronic obstructive pulmonary disease) ([place])   . Persistent atrial fibrillation ([place])   . Seizures ([place])   . Squamous cell carcinoma - supraglottic mass    a. 04/2020 CT Head/neck: 3.3 x 3.7 x 3.0 cm R supraglottic mass consistent w/ squamous cell carcinoma. Bilat lymph nodes concerning for malignancy.   Social History   Socioeconomic History  . Marital status: Divorced    Spouse  name: Not on file  . Number of children: Not on file  . Years of education: Not on file  . Highest education level: Not on file  Occupational History  . Not on file  Tobacco Use  . Smoking status: Former Research scientist (life sciences)  . Smokeless tobacco: Never Used  Substance and Sexual Activity  . Alcohol use: Not Currently    Alcohol/week: 6.0 standard drinks    Types: 6 Cans of beer per week  . Drug use: No  . Sexual activity: Not Currently  Other Topics Concern  . Not on file  Social History Narrative  . Not on file   Social Determinants of Health   Financial Resource Strain:   . Difficulty of Paying Living Expenses:   Food Insecurity:   . Worried About Charity fundraiser in the Last Year:   . Arboriculturist in the Last Year:   Transportation Needs:   . Film/video editor (Medical):   Marland Kitchen Lack of Transportation (Non-Medical):   Physical Activity:   . Days of  Exercise per Week:   . Minutes of Exercise per Session:   Stress:   . Feeling of Stress :   Social Connections:   . Frequency of Communication with Friends and Family:   . Frequency of Social Gatherings with Friends and Family:   . Attends Religious Services:   . Active Member of Clubs or Organizations:   . Attends Archivist Meetings:   Marland Kitchen Marital Status:    Family History  Problem Relation Age of Onset  . Hypertension Other    Scheduled Meds: . chlorhexidine  15 mL Mouth Rinse BID  . Chlorhexidine Gluconate Cloth  6 each Topical Daily  . diltiazem  120 mg Oral Daily  . mouth rinse  15 mL Mouth Rinse q12n4p  . methylPREDNISolone (SOLU-MEDROL) injection  40 mg Intravenous Q12H  . mometasone-formoterol  2 puff Inhalation BID   Continuous Infusions: . sodium chloride Stopped (05/24/20 1450)  . dextrose 5 % and 0.9% NaCl 50 mL/hr at 05/25/20 0900   PRN Meds:.sodium chloride, acetaminophen **OR** acetaminophen, ipratropium-albuterol, metoprolol tartrate, ondansetron **OR** ondansetron (ZOFRAN) IV Medications Prior to Admission:  Prior to Admission medications   Medication Sig Start Date End Date Taking? Authorizing Provider  diltiazem (CARDIZEM CD) 120 MG 24 hr capsule Take 1 capsule (120 mg total) by mouth daily. Hold for HR less than 55 or BP less than 100/60 04/26/20  Yes Fenton, Clint R, PA  ELIQUIS 2.5 MG TABS tablet TAKE 1 TABLET BY MOUTH TWICE A DAY. 04/27/20  Yes Terry Stain, MD  ipratropium-albuterol (DUONEB) 0.5-2.5 (3) MG/3ML SOLN Take 3 mLs by nebulization every 6 (six) hours as needed (sob/wheezing).  03/10/20  Yes [provider]  SYMBICORT 160-4.5 MCG/ACT inhaler INHALE 2 PUFFS INTO THE LUNGS IN THE MORNING AND AT BEDTIME 04/27/20  Yes Terry Stain, MD  VENTOLIN HFA 108 (90 Base) MCG/ACT inhaler INHALE 2 PUFFS INTO THE LUNGS EVERY 6 HOURS AS NEEDED FOR WHEEZING OR SHORTNESS OF BREATH. Patient taking differently: Inhale 2 puffs into the lungs  every 6 (six) hours as needed.  04/27/20  Yes Terry Stain, MD   No Known Allergies Review of Systems  Unable to perform ROS: Acuity of condition    Physical Exam Constitutional:      General: He is not in acute distress.    Appearance: He is ill-appearing.  Pulmonary:     Effort: No respiratory distress.  Skin:  General: Skin is warm and dry.  Neurological:     Mental Status: He is alert.     Motor: Weakness present.  Psychiatric:        Cognition and Memory: Cognition is impaired.     Vital Signs: BP (!) 134/90   Pulse 99   Temp 97.7 F (36.5 C) (Axillary)   Resp 19   Ht 5\' 5"  (1.651 m)   Wt 45.8 kg   SpO2 100%   BMI 16.80 kg/m  Pain Scale: Faces   Pain Score: 0-No pain   SpO2: SpO2: 100 % O2 Device:SpO2: 100 % O2 Flow Rate: .O2 Flow Rate (L/min): 5 L/min  IO: Intake/output summary:   Intake/Output Summary (Last 24 hours) at 05/25/2020 1058 Last data filed at 05/25/2020 1000 Gross per 24 hour  Intake 1316.77 ml  Output 850 ml  Net 466.77 ml    LBM: Last BM Date:  (PTA) Baseline Weight: Weight: 50 kg Most recent weight: Weight: 45.8 kg     Palliative Assessment/Data: PPS 10%   Discussed case with Dr. Sloan Carter, primary RN, TOC, hospice liaison, patient, Terry/sister  Time In: 1030 Time Out: 1140 Time Total: 70 minutes  Greater than 50%  of this time was spent counseling and coordinating care related to the above assessment and plan.  Signed by: Lin Landsman, NP   Please contact Palliative Medicine Team phone at [phone] for questions and concerns.  For individual provider: See Shea Evans

## 2020-05-25 NOTE — Progress Notes (Addendum)
Thank you Lattie Haw!  Barbette Reichmann MD ENT [phone]  Speech Language Pathology  Patient Details Name: [name] MRN: [id number] DOB: 06-Jun-1941 Today's Date: 05/25/2020 Time:  -      ENT desires instrumental swallow eval. Scheduled today at 10:30    [name] 05/25/2020, 9:27 AM   [name].Ed Risk analyst [phone] Office [phone]

## 2020-05-25 NOTE — Consult Note (Signed)
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  History obtained from the chart.  The patient presented to the emergency room due to stridor.  He had been choking on food prior to admission leading to coughing spells.  He also developed increasing shortness of breath.  On admission, he had a CT of the neck performed on 05/21/2020 which showed a 3.3 x 3.7 x 3.3 cm right supraglottic mass lesion consistent with a squamous cell carcinoma.  The tumor crosses midline posteriorly and extends at least 5 mm below the right vocal cord.  He also has rounded right level 2 and posterior level 3 lymph nodes concerning for malignancy-PET scan recommended.  The patient was seen by ENT and underwent tracheotomy on 05/23/2020.  Biopsy obtained which was consistent with squamous cell carcinoma.  The patient was seen earlier today by the palliative care team who spoke with the patient's sister, [name], who wished to focus on comfort measures and they were in the process of transferring the patient to residential hospice.  However, there is a note from the hospitalist indicating that the family initially wanted to proceed with comfort measures but after further discussion now wants to consider radiation and chemotherapy.  Medical oncology was asked see the patient to make recommendations regarding his newly diagnosed laryngeal cancer.  REVIEW OF SYSTEMS: When seen today, the patient had just moved out of the ICU to the stepdown unit.  No family at the bedside.  Able to shake his head yes and no and follow commands but unable to fully answer questions.  Unable to obtain  comprehensive review of systems.  PAST MEDICAL HISTORY: Past Medical History:  Diagnosis Date  . Alcohol abuse   . Aortic atherosclerosis ([place])    a. 04/2020 noted on CT.  Marland Kitchen Cardiomyopathy ([place])    a. 12/2001 Echo: EF 55-65%; b. 02/2020 Echo: EF 45-50%, glob HK. Mild LVH of the basal segment.  Mildly reduced RV fxn. Nl RVSP. Mild MR. Triv AI.  Marland Kitchen COPD (chronic obstructive pulmonary disease) ([place])   . Persistent atrial fibrillation ([place])   . Seizures ([place])   . Squamous cell carcinoma - supraglottic mass    a. 04/2020 CT Head/neck: 3.3 x 3.7 x 3.0 cm R supraglottic mass consistent w/ squamous cell carcinoma. Bilat lymph nodes concerning for malignancy.   PAST SURGICAL HISTORY: Past Surgical History:  Procedure Laterality Date  . DIRECT LARYNGOSCOPY N/A 05/23/2020   Procedure: DIRECT LARYNGOSCOPY with BIOPSY;  Surgeon: Melissa Montane, MD;  Location: [facility];  Service: ENT;  Laterality: N/A;  . TRACHEOSTOMY TUBE PLACEMENT N/A 05/23/2020   Procedure: TRACHEOSTOMY;  Surgeon: Melissa Montane, MD;  Location: [facility] OR;  Service: ENT;  Laterality: N/A;   FAMILY HISTORY Family History  Problem Relation Age of Onset  . Hypertension Other     SOCIAL HISTORY: The patient has 2 brothers who are deceased and 4 sisters who are living.  [name] is the main sibling that has been caring for [name].  [name] has 1 daughter and 1 son, but they do not speak to [name].  [name] lives with [name] for 2 years and then about 2 months ago became a resident of [facility]  [place] (assisted living).  The patient has a remote history of alcohol and tobacco use.  ADVANCED DIRECTIVES: DNR/DNI  HEALTH MAINTENANCE: Social History   Tobacco Use  . Smoking status: Former Research scientist (life sciences)  . Smokeless tobacco: Never Used  Substance Use Topics  . Alcohol use: Not Currently    Alcohol/week: 6.0 standard drinks    Types: 6 Cans of beer per week  . Drug use: No   Colonoscopy: Lipid panel: No Known Allergies Current  Facility-Administered Medications  Medication Dose Route Frequency Provider Last Rate Last Admin  . 0.9 %  sodium chloride infusion   Intravenous PRN Jonetta Osgood, MD   Paused at 05/24/20 1450  . acetaminophen (TYLENOL) tablet 650 mg  650 mg Oral Q6H PRN Melissa Montane, MD       Or  . acetaminophen (TYLENOL) suppository 650 mg  650 mg Rectal Q6H PRN Melissa Montane, MD      . chlorhexidine (PERIDEX) 0.12 % solution 15 mL  15 mL Mouth Rinse BID Ghimire, Henreitta Leber, MD      . Chlorhexidine Gluconate Cloth 2 % PADS 6 each  6 each Topical Daily Melissa Montane, MD   6 each at 05/24/20 2030  . dextrose 5 %-0.9 % sodium chloride infusion   Intravenous Continuous Melissa Montane, MD   Stopped at 05/25/20 1026  . diltiazem (CARDIZEM CD) 24 hr capsule 120 mg  120 mg Oral Daily Melissa Montane, MD   120 mg at 05/22/20 0933  . ipratropium-albuterol (DUONEB) 0.5-2.5 (3) MG/3ML nebulizer solution 3 mL  3 mL Nebulization Q6H PRN Melissa Montane, MD   3 mL at 05/22/20 0806  . MEDLINE mouth rinse  15 mL Mouth Rinse q12n4p Ghimire, Shanker M, MD      . methylPREDNISolone sodium succinate (SOLU-MEDROL) 40 mg/mL injection 40 mg  40 mg Intravenous Q12H Melissa Montane, MD   40 mg at 05/25/20 1210  . metoprolol tartrate (LOPRESSOR) injection 5 mg  5 mg Intravenous Q6H PRN Ghimire, Henreitta Leber, MD      . mometasone-formoterol (DULERA) 200-5 MCG/ACT inhaler 2 puff  2 puff Inhalation BID Melissa Montane, MD      . ondansetron [facility]) tablet 4 mg  4 mg Oral Q6H PRN Melissa Montane, MD       Or  . ondansetron [facility]) injection 4 mg  4 mg Intravenous Q6H PRN Melissa Montane, MD       OBJECTIVE: Chronically ill-appearing male, cachectic, no distress Vitals:   05/25/20 1300 05/25/20 1400  BP: (!) 130/75 (!) 145/84  Pulse: 98 95  Resp: 18 17  Temp:    SpO2: 97% 98%   Body mass index is 16.8 kg/m. ECOG FS:4 - Bedbound Ocular: Sclerae unicteric, pupils equal, round and reactive to light Ear-nose-throat: No thrush or mucositis Neck: Tracheostomy  midline Lymphatic: No cervical or supraclavicular adenopathy Lungs no rales or rhonchi, good excursion bilaterally Heart irregular, no murmurs, no lower extremity edema Abd soft, nontender, positive bowel sounds MSK no focal spinal tenderness, no joint edema Neuro: non-focal  LAB RESULTS: CMP     Component Value Date/Time   NA 139 05/25/2020 0316   K 3.9 05/25/2020 0316   CL 107 05/25/2020 0316   CO2 24 05/25/2020 0316   GLUCOSE 138 (H) 05/25/2020 0316   BUN 15 05/25/2020 0316   CREATININE 0.91 05/25/2020 0316   CALCIUM 8.5 (L) 05/25/2020 0316   PROT 5.7 (L) 05/25/2020 0316   ALBUMIN 2.7 (L) 05/25/2020 0316   AST 14 (L) 05/25/2020  0316   ALT 11 05/25/2020 0316   ALKPHOS 45 05/25/2020 0316   BILITOT 0.9 05/25/2020 0316   GFRNONAA >60 05/25/2020 0316   GFRAA >60 05/25/2020 0316   INo results found for: SPEP, UPEP Lab Results  Component Value Date   WBC 16.5 (H) 05/25/2020   NEUTROABS 5.3 05/21/2020   HGB 12.1 (L) 05/25/2020   HCT 33.3 (L) 05/25/2020   MCV 80.2 05/25/2020   PLT 259 05/25/2020   @LASTCHEMISTRY @ No results found for: LABCA2 No components found for: [id number] No results for input(s): INR in the last 168 hours. Urinalysis    Component Value Date/Time   COLORURINE YELLOW 07/14/2015 1352   APPEARANCEUR CLEAR 07/14/2015 1352   LABSPEC 1.015 07/14/2015 1352   PHURINE 7.5 07/14/2015 1352   GLUCOSEU NEGATIVE 07/14/2015 1352   HGBUR NEGATIVE 07/14/2015 1352   BILIRUBINUR NEGATIVE 07/14/2015 1352   KETONESUR NEGATIVE 07/14/2015 1352   PROTEINUR NEGATIVE 07/14/2015 1352   UROBILINOGEN 2.0 (H) 07/14/2015 1352   NITRITE NEGATIVE 07/14/2015 1352   LEUKOCYTESUR NEGATIVE 07/14/2015 1352   STUDIES: CT Soft Tissue Neck W Contrast  Result Date: 05/21/2020 CLINICAL DATA:  Right laryngeal mass.  Stridor. EXAM: CT NECK WITH CONTRAST TECHNIQUE: Multidetector CT imaging of the neck was performed using the standard protocol following the bolus administration of  intravenous contrast. CONTRAST:  38mL OMNIPAQUE IOHEXOL 300 MG/ML  SOLN COMPARISON:  CT chest 07/10/2016 and 03/25/2020 FINDINGS: Pharynx and larynx: Nasopharynx is clear. Soft palate is within normal limits. Tongue base and palatine tonsils are normal. The epiglottis is within normal limits. Soft tissue tumor fills the right area epiglottic fold. Tumor measures 3.3 x 3.7 x 3.0 cm. Tumor crosses midline posteriorly. Tumor extends at least 5 mm below the right vocal cord. Lower trachea is within normal limits. Salivary glands: Submandibular and parotid glands and ducts are within normal limits. Thyroid: Normal Lymph nodes: Right level 2 rounded nodes measuring 7 and 8 mm raise some concern for malignancy. Rounded posterior left level 3 lymph nodes are present. Vascular: Atherosclerotic changes are present at the carotid bifurcations bilaterally without significant stenosis. Additional calcifications are present at the origin of the left subclavian artery without significant stenosis. Limited intracranial: Negative. Visualized orbits: The globes and orbits are within normal limits. Mastoids and visualized paranasal sinuses: The paranasal sinuses and mastoid air cells are clear. Skeleton: Multilevel degenerative changes the cervical spine are most severe at C6-7 and C7-T1. 2 body heights are maintained. The patient is edentulous. Upper chest: Centrilobular emphysematous changes are present. Lung apices are otherwise clear. Thoracic inlet is within normal limits. IMPRESSION: 1. 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 Tumor crosses midline posteriorly and extends at least 5 mm below the right vocal cord. 2. Rounded right level 2 and posterior left level 3 lymph nodes are concerning for malignancy. PET scan of the neck would be useful to evaluate for any malignant nodes. 3. Aortic Atherosclerosis (ICD10-I70.0) and Emphysema (ICD10-J43.9). Electronically Signed   By:  San Morelle M.D.   On: 05/21/2020 22:00   DG Chest Port 1 View  Result Date: 05/24/2020 CLINICAL DATA:  Leukocytosis EXAM: PORTABLE CHEST 1 VIEW COMPARISON:  May 21, 2020 chest radiograph and chest CT Mar 25, 2020 FINDINGS: Tracheostomy catheter tip is 5.8 cm above the carina. No pneumothorax evident. There is, however, pneumomediastinum as well as subcutaneous air tracking in the supraclavicular regions bilaterally. Fibrotic type changes noted bilaterally, primarily in the mid and  lower lung regions. There is no frank edema or airspace opacity. Heart is upper normal in size with pulmonary vascularity normal. No adenopathy. No bone lesions. IMPRESSION: Pneumomediastinum and supraclavicular soft tissue air of uncertain etiology. No pneumothorax evident. No tracheostomy present. Question pneumomediastinum and subcutaneous air secondary to recent tracheostomy placement. Areas of fibrotic change bilaterally without frank edema or airspace opacity. Stable cardiac silhouette. Electronically Signed   By: Lowella Grip III M.D.   On: 05/24/2020 05:24   DG Chest Port 1 View  Result Date: 05/21/2020 CLINICAL DATA:  Difficulty breathing, COPD, CHF EXAM: PORTABLE CHEST 1 VIEW COMPARISON:  03/04/2020, 03/25/2020 FINDINGS: Single frontal view of the chest demonstrates a stable cardiac silhouette. There are chronic bilateral areas of scarring, with fibrosis greatest at the lung bases left greater than right. No acute airspace disease. No effusion or pneumothorax. No acute bony abnormalities. IMPRESSION: 1. 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 No acute intrathoracic process. Electronically Signed   By: Randa Ngo M.D.   On: 05/21/2020 19:21   DG Swallowing Func-Speech Pathology  Result Date: 05/25/2020 Objective Swallowing Evaluation: Type of Study: MBS-Modified Barium Swallow Study  Patient Details Name: [name] MRN: [id number] Date of Birth:  06/29/1941 Today's Date: 05/25/2020 Time: SLP Start Time (ACUTE ONLY): 1045 -SLP Stop Time (ACUTE ONLY): 1103 SLP Time Calculation (min) (ACUTE ONLY): 18 min Past Medical History: Past Medical History: Diagnosis Date . Alcohol abuse  . Aortic atherosclerosis ([place])   a. 04/2020 noted on CT. Marland Kitchen Cardiomyopathy ([place])   a. 12/2001 Echo: EF 55-65%; b. 02/2020 Echo: EF 45-50%, glob HK. Mild LVH of the basal segment.  Mildly reduced RV fxn. Nl RVSP. Mild MR. Triv AI. Marland Kitchen COPD (chronic obstructive pulmonary disease) ([place])  . Persistent atrial fibrillation ([place])  . Seizures ([place])  . Squamous cell carcinoma - supraglottic mass   a. 04/2020 CT Head/neck: 3.3 x 3.7 x 3.0 cm R supraglottic mass consistent w/ squamous cell carcinoma. Bilat lymph nodes concerning for malignancy. Past Surgical History: Past Surgical History: Procedure Laterality Date . DIRECT LARYNGOSCOPY N/A 05/23/2020  Procedure: DIRECT LARYNGOSCOPY with BIOPSY;  Surgeon: Melissa Montane, MD;  Location: [place];  Service: ENT;  Laterality: N/A; . TRACHEOSTOMY TUBE PLACEMENT N/A 05/23/2020  Procedure: TRACHEOSTOMY;  Surgeon: Melissa Montane, MD;  Location: [place];  Service: ENT;  Laterality: N/A; HPI:  Patient is a 79 y.o. 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 He has a trach and ENT advised againg Passy-Muir speaking valve presently. His laryngeal elevation and anterior hyoid excurison is decreased and mass appears to obstruct laryngeal vestibule and in turn appears to prevent greater penetration/aspiration. There was mild-moderate vallecular residue  consistently. Minimal amount of barium, seen close to and below vocal folds, possibly entered after the initial swallow. Pt began to have tachycardia, which RN was aware of. Towards end of study SAWT recommended pt go back to room and no strategies were attempted. Recommend regular texture and thin liquids, crushed meds. Therapist was made aware of pt's disposition plans to hospice today.        SLP Visit Diagnosis Dysphagia, pharyngeal phase (R13.13) Attention and concentration deficit following -- Frontal  lobe and executive function deficit following -- Impact on safety and function Mild aspiration risk;Moderate aspiration risk   CHL IP TREATMENT RECOMMENDATION 05/25/2020 Treatment Recommendations Other (Comment)   Prognosis 07/12/2016 Prognosis for Safe Diet Advancement Fair Barriers to Reach Goals Cognitive deficits Barriers/Prognosis Comment -- CHL IP DIET RECOMMENDATION 05/25/2020 SLP Diet Recommendations Regular solids;Thin liquid Liquid Administration via Cup;Straw Medication Administration Crushed with puree Compensations Slow rate;Small sips/bites Postural Changes Seated upright at 90 degrees   CHL IP OTHER RECOMMENDATIONS 05/25/2020 Recommended Consults -- Oral Care Recommendations Oral care BID Other Recommendations --   CHL IP FOLLOW UP RECOMMENDATIONS 05/25/2020 Follow up Recommendations None   CHL IP FREQUENCY AND DURATION 07/12/2016 Speech Therapy Frequency (ACUTE ONLY) min 2x/week Treatment Duration 1 week      CHL IP ORAL PHASE 05/25/2020 Oral Phase WFL Oral - Pudding Teaspoon -- Oral - Pudding Cup -- Oral - Honey Teaspoon -- Oral - Honey Cup -- Oral - Nectar Teaspoon -- Oral - Nectar Cup -- Oral - Nectar Straw -- Oral - Thin Teaspoon -- Oral - Thin Cup -- Oral - Thin Straw -- Oral - Puree -- Oral - Mech Soft -- Oral - Regular -- Oral - Multi-Consistency -- Oral - Pill -- Oral Phase - Comment --  CHL IP PHARYNGEAL PHASE 05/25/2020 Pharyngeal Phase Impaired Pharyngeal- Pudding Teaspoon -- Pharyngeal --  Pharyngeal- Pudding Cup -- Pharyngeal -- Pharyngeal- Honey Teaspoon -- Pharyngeal -- Pharyngeal- Honey Cup -- Pharyngeal -- Pharyngeal- Nectar Teaspoon Delayed swallow initiation-vallecula;Pharyngeal residue - valleculae;Reduced laryngeal elevation;Reduced anterior laryngeal mobility Pharyngeal -- Pharyngeal- Nectar Cup Pharyngeal residue - valleculae;Reduced anterior laryngeal mobility;Reduced laryngeal elevation Pharyngeal -- Pharyngeal- Nectar Straw -- Pharyngeal -- Pharyngeal- Thin Teaspoon -- Pharyngeal -- Pharyngeal- Thin Cup Pharyngeal residue - valleculae;Penetration/Apiration after swallow;Penetration/Aspiration during swallow;Reduced laryngeal elevation;Reduced anterior laryngeal mobility Pharyngeal Material enters airway, passes BELOW cords without attempt by patient to eject out (silent aspiration) Pharyngeal- Thin Straw Pharyngeal residue - valleculae Pharyngeal -- Pharyngeal- Puree -- Pharyngeal -- Pharyngeal- Mechanical Soft -- Pharyngeal -- Pharyngeal- Regular Pharyngeal residue - valleculae Pharyngeal -- Pharyngeal- Multi-consistency -- Pharyngeal -- Pharyngeal- Pill -- Pharyngeal -- Pharyngeal Comment --  CHL IP CERVICAL ESOPHAGEAL PHASE 05/25/2020 Cervical Esophageal Phase (No Data) Pudding Teaspoon -- Pudding Cup -- Honey Teaspoon -- Honey Cup -- Nectar Teaspoon -- Nectar Cup -- Nectar Straw -- Thin Teaspoon -- Thin Cup -- Thin Straw -- Puree -- Mechanical Soft -- Regular -- Multi-consistency -- Pill -- Cervical Esophageal Comment -- [name] 05/25/2020, 1:13 PM              ASSESSMENT: 79 y.o. [place], [place] male with   1) newly diagnosed laryngeal squamous cell carcinoma.  Tumor is at least T3.  Has not yet been fully staged.  He is not a surgical candidate.  2) dysphagia and protein calorie malnutrition secondary to #1  3) persistent A. fib with RVR  4) hypertension  5) COPD  6) idiopathic pulmonary fibrosis  PLAN: Mr. See has newly diagnosed  laryngeal squamous cell carcinoma.  The patient's family initially indicated that they wanted to pursue comfort measures and not put him through PEG tube placement, chemotherapy or radiation and he was scheduled to discharge to residential hospice today.  However, the patient's family has now reconsidered this and wishes for him to be evaluated for possible chemotherapy and radiation.  The patient has not yet had a full staging work-up.  If the patient and his family are interested in being considered for treatment, he  will need a full staging work-up.  Will discuss with on-call physician about proceeding with CT scanning of chest, abdomen, pelvis versus outpatient PET scan.  The stage of his disease will determine the treatment options.  The patient has a poor ECOG performance status of at least 3-4 and also has a very poor nutritional status.  I am not certain that he would be a candidate for systemic chemotherapy because of this but might be a candidate for radiation alone.  We will request a radiation oncology consult.  I think it would be helpful for the palliative care team to continue ongoing goals of care discussion with the patient and his family.  He would need nutrition if he were to be considered for any form of treatment including radiation.  Earlier today, they did not wish to put the patient through PEG tube placement.  Mikey Bussing, NP 05/25/2020 3:05 PM

## 2020-05-25 NOTE — Progress Notes (Addendum)
Addendum: I spoke with pathology.  The patient has squamous cell carcinoma on biopsy.   Subjective:    Patient ID: [name], male    DOB: 1941/01/08, 79 y.o.   MRN: [id number]   No issues overnight.  Path pending.         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 Lytes ok. OK to go out of unit - medicine to continue to follow as primary service

## 2020-05-25 NOTE — Anesthesia Postprocedure Evaluation (Signed)
Anesthesia Post Note  Patient: [name]  Procedure(s) Performed: Case Cancelled.  No procedure done.    Patient location during evaluation: PACU Anesthesia Type: MAC Level of consciousness: awake and alert Pain management: pain level controlled Vital Signs Assessment: post-procedure vital signs reviewed and stable Respiratory status: spontaneous breathing, nonlabored ventilation, respiratory function stable and patient connected to nasal cannula oxygen Cardiovascular status: stable and blood pressure returned to baseline Postop Assessment: no apparent nausea or vomiting Anesthetic complications: no Comments: Case was cancelled.  No sedation given. No procedure was attempted.   No complications documented.  Last Vitals:  Vitals:   05/25/20 1000 05/25/20 1152  BP: (!) 134/90   Pulse: 99   Resp: 19   Temp:  36.8 C  SpO2: 100%     Last Pain:  Vitals:   05/25/20 1152  TempSrc: Oral  PainSc:                  [place]

## 2020-05-25 NOTE — Progress Notes (Signed)
Similar pattern of excellent rate control during most of the day with transient RVR during stimulation by suctioning. For modified swallow study soon. Not on PO meds. Will follow for arrhythmia.

## 2020-05-26 ENCOUNTER — Telehealth: Payer: Self-pay

## 2020-05-26 ENCOUNTER — Encounter (HOSPITAL_COMMUNITY): Payer: Self-pay | Admitting: Otolaryngology

## 2020-05-26 DIAGNOSIS — D649 Anemia, unspecified: Secondary | ICD-10-CM | POA: Diagnosis not present

## 2020-05-26 DIAGNOSIS — I4821 Permanent atrial fibrillation: Secondary | ICD-10-CM | POA: Diagnosis not present

## 2020-05-26 DIAGNOSIS — D141 Benign neoplasm of larynx: Secondary | ICD-10-CM

## 2020-05-26 DIAGNOSIS — I4891 Unspecified atrial fibrillation: Secondary | ICD-10-CM

## 2020-05-26 DIAGNOSIS — J84112 Idiopathic pulmonary fibrosis: Secondary | ICD-10-CM

## 2020-05-26 DIAGNOSIS — C321 Malignant neoplasm of supraglottis: Secondary | ICD-10-CM

## 2020-05-26 DIAGNOSIS — J387 Other diseases of larynx: Secondary | ICD-10-CM | POA: Diagnosis not present

## 2020-05-26 LAB — CBC
HCT: 35.1 % — ABNORMAL LOW (ref 39.0–52.0)
Hemoglobin: 12.5 g/dL — ABNORMAL LOW (ref 13.0–17.0)
MCH: 28.3 pg (ref 26.0–34.0)
MCHC: 35.6 g/dL (ref 30.0–36.0)
MCV: 79.6 fL — ABNORMAL LOW (ref 80.0–100.0)
Platelets: 182 10*3/uL (ref 150–400)
RBC: 4.41 MIL/uL (ref 4.22–5.81)
RDW: 13.3 % (ref 11.5–15.5)
WBC: 23.3 10*3/uL — ABNORMAL HIGH (ref 4.0–10.5)
nRBC: 0 % (ref 0.0–0.2)

## 2020-05-26 LAB — COMPREHENSIVE METABOLIC PANEL
ALT: 11 U/L (ref 0–44)
AST: 13 U/L — ABNORMAL LOW (ref 15–41)
Albumin: 2.9 g/dL — ABNORMAL LOW (ref 3.5–5.0)
Alkaline Phosphatase: 49 U/L (ref 38–126)
Anion gap: 9 (ref 5–15)
BUN: 17 mg/dL (ref 8–23)
CO2: 24 mmol/L (ref 22–32)
Calcium: 8.5 mg/dL — ABNORMAL LOW (ref 8.9–10.3)
Chloride: 105 mmol/L (ref 98–111)
Creatinine, Ser: 0.75 mg/dL (ref 0.61–1.24)
GFR calc Af Amer: >60 mL/min (ref >60)
GFR calc non Af Amer: >60 mL/min (ref >60)
Glucose, Bld: 137 mg/dL — ABNORMAL HIGH (ref 70–99)
Potassium: 3.8 mmol/L (ref 3.5–5.1)
Sodium: 138 mmol/L (ref 135–145)
Total Bilirubin: 1.2 mg/dL (ref 0.3–1.2)
Total Protein: 5.9 g/dL — ABNORMAL LOW (ref 6.5–8.1)

## 2020-05-26 LAB — SURGICAL PATHOLOGY

## 2020-05-26 MED ORDER — RESOURCE THICKENUP CLEAR PO POWD
ORAL | Status: DC | PRN
  Filled 2020-05-26: qty 125

## 2020-05-26 MED ORDER — PROSOURCE PLUS PO LIQD
30.0000 mL | Freq: Two times a day (BID) | ORAL | Status: DC
  Administered 2020-05-26 – 2020-05-29 (×3): 30 mL via ORAL
  Filled 2020-05-26 (×7): qty 30

## 2020-05-26 MED ORDER — ENSURE ENLIVE PO LIQD
237.0000 mL | Freq: Three times a day (TID) | ORAL | Status: DC
  Administered 2020-05-26 – 2020-05-29 (×5): 237 mL via ORAL

## 2020-05-26 MED ORDER — METHYLPREDNISOLONE SODIUM SUCC 40 MG IJ SOLR
20.0000 mg | Freq: Two times a day (BID) | INTRAMUSCULAR | Status: DC
  Administered 2020-05-26 – 2020-05-27 (×3): 20 mg via INTRAVENOUS
  Filled 2020-05-26 (×3): qty 1

## 2020-05-26 MED ORDER — ADULT MULTIVITAMIN W/MINERALS CH
1.0000 | ORAL_TABLET | Freq: Every day | ORAL | Status: DC
  Administered 2020-05-26 – 2020-05-29 (×4): 1 via ORAL
  Filled 2020-05-26 (×4): qty 1

## 2020-05-26 MED ORDER — METOPROLOL TARTRATE 12.5 MG HALF TABLET
12.5000 mg | ORAL_TABLET | Freq: Three times a day (TID) | ORAL | Status: DC
  Administered 2020-05-26 (×2): 12.5 mg via ORAL
  Filled 2020-05-26 (×2): qty 1

## 2020-05-26 NOTE — Telephone Encounter (Signed)
Called patient's sister [name] to let her know that after updating Dr. Isidore Moos on patient's condition, it was decided to hold off on simulating patient for radiation until patient is more medically stable. Informed her that Dr. Isidore Moos plans to talk with patient's hospitalist team to come up with a plan on getting patient to [facility] for radiation consultation and possible treatment. Sister verbalized understanding and agreement with plan, but did vocalize concern about her brother receiving radiation given is fragile state. Assured patient that Dr. Isidore Moos would take patient's health status into account when making radiation dose/plan, and that she would talk at length with patient and family to make sure that everyone was comfortable and agreeable to decision. [name] verbalized understanding and appreciation of call. Will continue to support as needed.

## 2020-05-26 NOTE — Progress Notes (Signed)
  Speech Language Pathology Treatment: Dysphagia  Patient Details Name: [name] MRN: [id number] DOB: [birth year] Today's Date: 05/26/2020 Time: 2248-2500 SLP Time Calculation (min) (ACUTE ONLY): 15 min  Assessment / Plan / Recommendation Clinical Impression  Skilled speech therapy with pt who has laryngeal mass, not appropriate for Passy-Muir speaking valve per ENT s/p MBS yesterday. Evidence of expectorated secretions in trach mask and on chest. SLP deflated cuff eliciting mild cough and expectoration of mucous via trach. Thin liquids consumed with straw, performing two swallows to clear oral cavity and cued for dry swallow as strategy to decreased vallecular residue. Masticated graham cracker sans dentures (not present) with clearance of oral cavity. He produced hard reflexive cough following cracker. Educated pt on differences of Dys 2 and 3 and pt choose Dys 3 (chop meats only). Goal is radiation treatment per MD. Pt will need continued ST and recommend outpatient ST to initiate exercise program for radiation effects on swallowing musculature.    HPI HPI:  Patient is a 79 y.o. male with a history of persistent atrial fibrillation, pulmonary fibrosis, COPD, remote alcohol/tobacco use-who presented with stridor/shortness of breath-found to have epiglottic mass-underwent tracheostomy on 7/25      SLP Plan  Continue with current plan of care       Recommendations  Diet recommendations: Dysphagia 3 (mechanical soft);Thin liquid Liquids provided via: Cup;Straw Medication Administration: Crushed with puree Supervision: Patient able to self feed;Intermittent supervision to cue for compensatory strategies Compensations: Slow rate;Small sips/bites;Multiple dry swallows after each bite/sip Postural Changes and/or Swallow Maneuvers: Seated upright 90 degrees                Oral Care Recommendations: Oral care BID Follow up Recommendations:  (TBD) SLP Visit Diagnosis: Dysphagia,  pharyngeal phase (R13.13) Plan: Continue with current plan of care       GO                [name] 05/26/2020, 12:15 PM  [name].Ed Risk analyst [phone] Office [phone]

## 2020-05-26 NOTE — Evaluation (Signed)
Physical Therapy Evaluation Patient Details Name: [name] MRN: [id number] DOB: September 17, 1941 Today's Date: 05/26/2020   History of Present Illness  79yo male presenting to the ED with increased stridor and SOB, recent history of choking on his food. 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 Received direct laryngoscopy and trach tube placement 05/23/20. PMH supraglottic carcinoma/mass, hx seizurse, A-fib, COPD, cardiomyopathy, alcohol abuse  Clinical Impression   Patient received in bed, pleasant and able to communicate via nodding head yes/no to simple questions and commands; able to follow all cues and commands well today. See below for mobility/assist levels. Able to get to standing at EOB with Min-ModAx2 with HHA but only able to stand for less than 1 minute due to HR elevation to 127BPM, HR did recover to baseline with return to supine rest. SPO2 >98% on trach collar. Needed intermittent suction of external opening of trach due to small amounts of secretions but tolerated well. Left in bed with all needs met, bed alarm active and RN aware of patient status. Will benefit from SNF moving forward.     Follow Up Recommendations SNF;Supervision/Assistance - 24 hour    Equipment Recommendations  Other (comment) (defer to next venue)    Recommendations for Other Services       Precautions / Restrictions Precautions Precautions: Fall;Other (comment) Precaution Comments: watch vitals especially HR/O2 Restrictions Weight Bearing Restrictions: No      Mobility  Bed Mobility Overal bed mobility: Needs Assistance Bed Mobility: Rolling;Supine to Sit;Sit to Supine Rolling: Min assist   Supine to sit: Min assist;HOB elevated Sit to supine: Min assist;+2 for physical assistance   General bed mobility comments: able to mobilize well with MinA for safety and to complete movements, HR/O2 WNL with bed mobility  Transfers Overall transfer level: Needs  assistance Equipment used: 2 person hand held assist Transfers: Sit to/from Stand Sit to Stand: Min assist;Mod assist         General transfer comment: 2 person HHA with one person providing MinA and other providing [place]; stood for less than 1 minute when HR rose to 127BPM  Ambulation/Gait             General Gait Details: deferred- elevated HR  Stairs            Wheelchair Mobility    Modified Rankin (Stroke Patients Only)       Balance Overall balance assessment: Needs assistance   Sitting balance-Leahy Scale: Fair Sitting balance - Comments: min guard for safety   Standing balance support: No upper extremity supported;During functional activity Standing balance-Leahy Scale: Poor Standing balance comment: Min-ModAx2 to maintain safety and balance standing along side EOB                             Pertinent Vitals/Pain Pain Assessment: Faces Pain Score: 0-No pain Faces Pain Scale: No hurt Pain Intervention(s): Limited activity within patient's tolerance;Monitored during session    [place] expects to be discharged to:: Assisted living                 Additional Comments: unable to get much detailed history from patient, able to confirm he is from ALF per MD notes    Prior Function           Comments: unable to get much detailed  history, family not available to provide details     Hand Dominance  Extremity/Trunk Assessment   Upper Extremity Assessment Upper Extremity Assessment: Defer to OT evaluation    Lower Extremity Assessment Lower Extremity Assessment: Generalized weakness    Cervical / Trunk Assessment Cervical / Trunk Assessment: Kyphotic  Communication   Communication: Tracheostomy  Cognition Arousal/Alertness: Awake/alert Behavior During Therapy: Flat affect Overall Cognitive Status: No family/caregiver present to determine baseline cognitive functioning                                  General Comments: followed simple commands very well and able to answer yes/no to simple questions; communication limited by trach collar; no family present to help determine true baseline      General Comments General comments (skin integrity, edema, etc.): HR WNL 90-110BPM with bed mobility and supine to sit, rose to 127BPM with standing then recovered to baseline with return to supine; SPO2 >98% on trach collar    Exercises     Assessment/Plan    PT Assessment Patient needs continued PT services  PT Problem List Decreased strength;Decreased activity tolerance;Decreased safety awareness;Decreased balance;Decreased mobility;Decreased coordination       PT Treatment Interventions DME instruction;Balance training;Gait training;Stair training;Cognitive remediation;Functional mobility training;Patient/family education;Therapeutic activities;Therapeutic exercise    PT Goals (Current goals can be found in the Care Plan section)  Acute Rehab PT Goals PT Goal Formulation: Patient unable to participate in goal setting Time For Goal Achievement: 06/09/20 Potential to Achieve Goals: Fair    Frequency Min 2X/week   Barriers to discharge        Co-evaluation               AM-PAC PT "6 Clicks" Mobility  Outcome Measure Help needed turning from your back to your side while in a flat bed without using bedrails?: A Little Help needed moving from lying on your back to sitting on the side of a flat bed without using bedrails?: A Little Help needed moving to and from a bed to a chair (including a wheelchair)?: A Lot Help needed standing up from a chair using your arms (e.g., wheelchair or bedside chair)?: A Lot Help needed to walk in hospital room?: A Lot Help needed climbing 3-5 steps with a railing? : Total 6 Click Score: 13    End of Session Equipment Utilized During Treatment: Oxygen Activity Tolerance: Patient tolerated treatment well Patient left: in  bed;with call bell/phone within reach;with bed alarm set Nurse Communication: Mobility status;Other (comment) (patient request for suctioning) PT Visit Diagnosis: Unsteadiness on feet (R26.81);Muscle weakness (generalized) (M62.81);Difficulty in walking, not elsewhere classified (R26.2)    Time: 1025-8527 PT Time Calculation (min) (ACUTE ONLY): 16 min   Charges:   PT Evaluation $PT Eval Moderate Complexity: 1 Mod          Windell Norfolk, DPT, PN1   Supplemental Physical Therapist [place]    Pager [phone] Acute Rehab Office [phone]

## 2020-05-26 NOTE — Progress Notes (Signed)
RT NOTES: Suctioned patient during rounds, heart rate increased to 196. RN aware.

## 2020-05-26 NOTE — Progress Notes (Signed)
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   Revised.   GOAL:   Patient will meet greater than or equal to 90% of their needs  Progressing  MONITOR:   PO intake, Supplement acceptance, Labs, Weight trends, I & O's  REASON FOR ASSESSMENT:   Malnutrition Screening Tool    ASSESSMENT:   Pt with PMH significant for seizures, a.fib, COPD, pulmonary fibrosis, alcohol abuse admitted on 05/21/2020 with shortness of breath and stridor, then was found to have supraglottic squamous cell carcinoma.  7/25 - s/p Tracheostomy and direct laryngoscopy 7/27 - s/p MBS, pt assigned Dysphagia 2 diet with thin liquids  Discussed pt with RN. Per RN, pt did well with meals yesterday, but unsure how pt has done with meals today. RN states that pt appears eager to eat. RN to inform RD of how pt does with lunch. RD will order oral nutrition supplements for pt to increase kcal/protein intake.    Pt sleeping at time of RD visit and only briefly woke up during exam. Pt nodded his head "yes" when asked if he was feeling hungry and when asked if he was able to eat his meals so far. Pt fell back asleep during questioning. No family at bedside.   Per Palliative Care note, pt's sister states that pt had been eating well up until ~2-3 months ago.   Per MD, the family initially wanted to proceed with comfort measures but after  further discussion now wants to consider radiation and chemotherapy.   Per wt readings, pt weighed 45.8 kg on 05/24/20 and 58 kg on 03/10/20. This would indicate a 20% wt loss x2 months, which is severe and significant for time frame.   PO intake: 100% x 1 recorded meal  Labs reviewed. Medications: solu-medrol   NUTRITION - FOCUSED PHYSICAL EXAM:    Most Recent Value  Orbital Region Mild depletion  Upper Arm Region Moderate depletion  Thoracic and Lumbar Region Severe depletion  Buccal Region Moderate depletion  Temple Region Moderate depletion  Clavicle Bone Region Severe depletion  Clavicle and Acromion Bone Region Severe depletion  Scapular Bone Region Moderate depletion  Dorsal Hand Moderate depletion  Patellar Region Severe depletion  Anterior Thigh Region Severe depletion  Posterior Calf Region Severe depletion  Edema (RD Assessment) None  Hair Reviewed  Eyes Reviewed  Mouth Reviewed  Skin Reviewed  Nails Reviewed       Diet Order:   Diet Order            DIET DYS 2 Room service appropriate? No; Fluid consistency: Thin  Diet effective now                 EDUCATION NEEDS:   Not appropriate for education at this time  Skin:  Skin Assessment: Skin Integrity Issues: Skin Integrity Issues:: Incisions Incisions: neck  Last BM:  PTA  Height:   Ht Readings from Last 1  Encounters:  05/21/20 5\' 5"  (1.651 m)    Weight:   Wt Readings from Last 1 Encounters:  05/24/20 45.8 kg    BMI:  Body mass index is 16.8 kg/m.  Estimated Nutritional Needs:   Kcal:  1700-1900  Protein:  85-95 grams  Fluid:  >/= 1.7L/d    Larkin Ina, MS, RD, LDN RD pager number and weekend/on-call pager number located in [place].

## 2020-05-26 NOTE — Progress Notes (Signed)
PROGRESS NOTE        PATIENT DETAILS Name: [name] Age: 79 y.o. Sex: male Date of Birth: 1941/03/01 Admit Date: 05/21/2020 Admitting Physician Bonnielee Haff, MD IZT:[id number], Burnett Harry, MD  Brief Narrative: Patient is a 79 y.o. 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  See below for further details.  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  Assessment/Plan: Shortness of breath with stridor secondary to supraglottic tumor: S/p tracheostomy with laryngeal biopsy on 9/83-[id number] course complicated by bleeding that has now resolved.  Biopsy showing squamous cell carcinoma.  ENT following-have consulted medical oncology-awaiting further input.  Will start tapering steroids-not sure if they have a significant role at this point as he is status post tracheostomy.  Leukocytosis: Likely secondary to steroids-afebrile-no other indication of infection-watch closely as he is at risk for aspiration.  Persistent A. fib with RVR: Rate controlled this morning-as discussed with ENT-Dr. Marcelline Deist on 7/27-no anticoagulation for 1 week from 7/25-if no further bleeding episodes-okay to resume anticoagulation.  Continue rate  control with Cardizem-cardiology following- appreciate input.    Dysphagia: Secondary to laryngeal mass-spoke with SLP-recommendations are for a dysphagia 2 diet.  May require PEG tube placement at some point-we will follow.   HTN: BP stable-resume oral antihypertensives when able.  History of idiopathic pulmonary fibrosis: Appears stable-followed by Dr. Joya Gaskins in the outpatient setting-etiology thought to be recurrent aspiration.  Normocytic anemia: Mild-stable for follow-up.  Debility/deconditioning: Persistent patient's history-has been gradually declining in function over the past few months-now at ALF-we will obtain PT/OT eval.  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  Appreciate palliative care evaluation-initially family wanted to proceed with comfort measures-but after further discussion-they are now willing to consider radiation/chemotx  Nutrition Problem: Nutrition Problem: Inadequate oral intake Etiology: inability to eat Signs/Symptoms: NPO status Interventions: Refer to RD note for recommendations   Diet: Diet Order            DIET DYS 2 Room service appropriate? No; Fluid consistency: Thin  Diet effective now                  Code Status: DNR per palliative care.  Family Communication: Sister over [phone] on 7/28  Disposition Plan: Status is: Inpatient  Remains inpatient appropriate because:Inpatient level of care appropriate due to severity of illness  Dispo:  Patient From: ALF  Planned Disposition: To be determined  Expected discharge date: 05/25/20  Medically stable for discharge: No   Barriers to Discharge: Stridor due to laryngeal mass-s/p tracheotomy-postoperative course  complicated by bleeding-further evaluation in progress.  Antimicrobial agents: Anti-infectives (  From admission, onward)   None       Time spent: 64 minutes-Greater than 50% of this time was spent in counseling, explanation of diagnosis, planning of further management, and coordination of care.  MEDICATIONS: Scheduled Meds: . chlorhexidine  15 mL Mouth Rinse BID  . Chlorhexidine Gluconate Cloth  6 each Topical Daily  . diltiazem  120 mg Oral Daily  . mouth rinse  15 mL Mouth Rinse q12n4p  . methylPREDNISolone (SOLU-MEDROL) injection  20 mg Intravenous Q12H  . metoprolol tartrate  12.5 mg Oral TID  . mometasone-formoterol  2 puff Inhalation BID   Continuous Infusions: . sodium chloride Stopped (05/24/20 1450)  . dextrose 5 % and 0.9% NaCl Stopped (05/25/20 1026)   PRN Meds:.sodium chloride, acetaminophen **OR** acetaminophen, ipratropium-albuterol, metoprolol tartrate, ondansetron **OR** ondansetron (ZOFRAN) IV   PHYSICAL EXAM: Vital signs: Vitals:   05/25/20 2206 05/26/20 0431 05/26/20 0738 05/26/20 0810  BP: 119/80 113/78  (!) 131/68  Pulse: 90 83 (!) 120   Resp:   18 23  Temp: 99.5 F (37.5 C) 98 F (36.7 C)  98.3 F (36.8 C)  TempSrc: Oral Oral  Oral  SpO2: 94% 95% 94% 93%  Weight:      Height:       Filed Weights   05/21/20 1900 05/23/20 2126 05/24/20 2101  Weight: 50 kg 46.6 kg 45.8 kg   Body mass index is 16.8 kg/m.   Gen Exam:Alert awake-not in any distress HEENT:atraumatic, normocephalic Chest: B/L clear to auscultation anteriorly CVS:S1S2 regular Abdomen:soft non tender, non distended Extremities:no edema Neurology: Non focal Skin: no rash  I have personally reviewed following labs and imaging studies  LABORATORY DATA: CBC: Recent Labs  Lab 05/21/20 1858 05/22/20 0431 05/24/20 0119 05/25/20 0316 05/26/20 0221  WBC 7.5 5.4 16.9* 16.5* 23.3*  NEUTROABS 5.3  --   --   --   --   HGB 11.9* 12.2* 11.6* 12.1* 12.5*  HCT 33.5* 34.6* 33.2*  33.3* 35.1*  MCV 82.9 82.4 80.2 80.2 79.6*  PLT 173 164 173 259 245    Basic Metabolic Panel: Recent Labs  Lab 05/21/20 1858 05/22/20 0431 05/24/20 0119 05/25/20 0316 05/26/20 0221  NA 141 138 140 139 138  K 3.4* 4.0 3.7 3.9 3.8  CL 104 103 110 107 105  CO2 26 25 25 24 24   GLUCOSE 102* 147* 138* 138* 137*  BUN 18 16 16 15 17   CREATININE 1.01 0.83 0.83 0.91 0.75  CALCIUM 9.1 9.1 8.6* 8.5* 8.5*  MG  --   --  2.0 1.9  --     GFR: Estimated Creatinine Clearance: 47.7 mL/min (by C-G formula based on SCr of 0.75 mg/dL).  Liver Function Tests: Recent Labs  Lab 05/22/20 0431 05/24/20 0119 05/25/20 0316 05/26/20 0221  AST 14* 14* 14* 13*  ALT 10 11 11 11   ALKPHOS 55 44 45 49  BILITOT 1.0 0.8 0.9 1.2  PROT 7.2 5.6* 5.7* 5.9*  ALBUMIN 3.9 2.7* 2.7* 2.9*   No results for input(s): LIPASE, AMYLASE in the last 168 hours. No results for input(s): AMMONIA in the last 168 hours.  Coagulation Profile: No results for input(s): INR, PROTIME in the last 168 hours.  Cardiac Enzymes: No results for input(s): CKTOTAL, CKMB, CKMBINDEX, TROPONINI in the last 168 hours.  BNP (last 3 results) No results for input(s): PROBNP in the last 8760 hours.  Lipid Profile: No results for input(s): CHOL, HDL, LDLCALC, TRIG, CHOLHDL, LDLDIRECT in the last 72 hours.  Thyroid Function Tests: No results for input(s): TSH, T4TOTAL, FREET4, T3FREE, THYROIDAB in the last 72 hours.  Anemia Panel: No results for input(s): VITAMINB12, FOLATE, FERRITIN, TIBC, IRON, RETICCTPCT in the last 72 hours.  Urine analysis:    Component Value Date/Time   COLORURINE YELLOW 07/14/2015 1352   APPEARANCEUR CLEAR 07/14/2015 1352   LABSPEC 1.015 07/14/2015 1352   PHURINE 7.5 07/14/2015 1352   GLUCOSEU NEGATIVE 07/14/2015 1352   HGBUR NEGATIVE 07/14/2015 1352   BILIRUBINUR NEGATIVE 07/14/2015 1352   KETONESUR NEGATIVE 07/14/2015 1352   PROTEINUR NEGATIVE 07/14/2015 1352   UROBILINOGEN 2.0 (H) 07/14/2015 1352     NITRITE NEGATIVE 07/14/2015 1352   LEUKOCYTESUR NEGATIVE 07/14/2015 1352    Sepsis Labs: Lactic Acid, Venous No results found for: LATICACIDVEN  MICROBIOLOGY: Recent Results (from the past 240 hour(s))  SARS Coronavirus 2 by RT PCR (hospital order, performed in [place] hospital lab) Nasopharyngeal Nasopharyngeal Swab     Status: None   Collection Time: 05/21/20  7:19 PM   Specimen: Nasopharyngeal Swab  Result Value Ref Range Status   SARS Coronavirus 2 NEGATIVE NEGATIVE Final    Comment: (NOTE) SARS-CoV-2 target nucleic acids are NOT DETECTED.  The SARS-CoV-2 RNA is generally detectable in upper and lower respiratory specimens during the acute phase of infection. The lowest concentration of SARS-CoV-2 viral copies this assay can detect is 250 copies / mL. A negative result does not preclude SARS-CoV-2 infection and should not be used as the sole basis for treatment or other patient management decisions.  A negative result may occur with improper specimen collection / handling, submission of specimen other than nasopharyngeal swab, presence of viral mutation(s) within the areas targeted by this assay, and inadequate number of viral copies (<250 copies / mL). A negative result must be combined with clinical observations, patient history, and epidemiological information.  Fact Sheet for Patients:   StrictlyIdeas.no  Fact Sheet for Healthcare Providers: BankingDealers.co.za  This test is not yet approved or  cleared by the [place] FDA and has been authorized for detection and/or diagnosis of SARS-CoV-2 by FDA under an Emergency Use Authorization (EUA).  This EUA will remain in effect (meaning this test can be used) for the duration of the COVID-19 declaration under Section 564(b)(1) of the Act, 21 U.S.C. section 360bbb-3(b)(1), unless the authorization is terminated or revoked sooner.  Performed at [facility], [place] [address], [place], [place] [address]   MRSA PCR Screening     Status: None   Collection Time: 05/22/20  8:54 PM   Specimen: Nasal Mucosa; Nasopharyngeal  Result Value Ref Range Status   MRSA by PCR NEGATIVE NEGATIVE Final    Comment:        The GeneXpert MRSA Assay (FDA approved for NASAL specimens only), is one component of a comprehensive MRSA colonization surveillance program. It is not intended to diagnose MRSA infection nor to guide or monitor treatment for MRSA infections. Performed at [facility], [place] [address], [place], [place] [address]     RADIOLOGY STUDIES/RESULTS: DG Swallowing Func-Speech Pathology  Result Date: 05/25/2020 Objective Swallowing Evaluation: Type of Study: MBS-Modified Barium Swallow Study  Patient Details Name: Sadiq Mccauley MRN: [id number] Date of Birth: 1941-03-15 Today's Date: 05/25/2020 Time: SLP Start Time (ACUTE ONLY): 1045 -SLP Stop Time (ACUTE ONLY): 1103 SLP Time Calculation (min) (ACUTE ONLY): 18 min Past Medical History: Past Medical History: Diagnosis Date . Alcohol abuse  . Aortic atherosclerosis ([place])   a. 04/2020 noted on  CT. . Cardiomyopathy ([place])   a. 12/2001 Echo: EF 55-65%; b. 02/2020 Echo: EF 45-50%, glob HK. Mild LVH of the basal segment.  Mildly reduced RV fxn. Nl RVSP. Mild MR. Triv AI. Marland Kitchen COPD (chronic obstructive pulmonary disease) ([place])  . Persistent atrial fibrillation ([place])  . Seizures ([place])  . Squamous cell carcinoma - supraglottic mass   a. 04/2020 CT Head/neck: 3.3 x 3.7 x 3.0 cm R supraglottic mass consistent w/ squamous cell carcinoma. Bilat lymph nodes concerning for malignancy. Past Surgical History: Past Surgical History: Procedure Laterality Date . DIRECT LARYNGOSCOPY N/A 05/23/2020  Procedure: DIRECT LARYNGOSCOPY with BIOPSY;  Surgeon: Melissa Montane, MD;  Location: [place];  Service: ENT;  Laterality: N/A; . TRACHEOSTOMY TUBE PLACEMENT N/A 05/23/2020  Procedure: TRACHEOSTOMY;  Surgeon:  Melissa Montane, MD;  Location: [place];  Service: ENT;  Laterality: N/A; HPI:  Patient is a 79 y.o. 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 He has a trach and ENT advised againg Passy-Muir speaking valve presently. His laryngeal elevation and anterior hyoid excurison is decreased and mass appears to obstruct laryngeal vestibule and in turn appears to prevent greater penetration/aspiration. There was mild-moderate vallecular residue consistently. Minimal amount of barium, seen close to and below vocal folds, possibly entered after the initial swallow. Pt began to have tachycardia, which RN was aware of. Towards end of study SAWT recommended pt go back to room and no strategies were attempted. Recommend regular texture and thin liquids, crushed meds. Therapist was made aware of pt's disposition plans to hospice today.        SLP Visit Diagnosis Dysphagia, pharyngeal phase (R13.13) Attention and concentration deficit following -- Frontal lobe and executive function deficit following -- Impact on safety and function Mild aspiration risk;Moderate aspiration risk   CHL IP TREATMENT RECOMMENDATION 05/25/2020 Treatment Recommendations Other (Comment)   Prognosis 07/12/2016 Prognosis for Safe Diet Advancement Fair Barriers to Reach Goals Cognitive deficits Barriers/Prognosis Comment -- CHL IP DIET RECOMMENDATION 05/25/2020 SLP Diet Recommendations Regular solids;Thin liquid Liquid Administration via Cup;Straw Medication Administration Crushed with puree Compensations Slow rate;Small sips/bites Postural  Changes Seated upright at 90 degrees   CHL IP OTHER RECOMMENDATIONS 05/25/2020 Recommended Consults -- Oral Care Recommendations Oral care BID Other Recommendations --   CHL IP FOLLOW UP RECOMMENDATIONS 05/25/2020 Follow up Recommendations None   CHL IP FREQUENCY AND DURATION 07/12/2016 Speech Therapy Frequency (ACUTE ONLY) min 2x/week Treatment Duration 1 week      CHL IP ORAL PHASE 05/25/2020 Oral Phase WFL Oral - Pudding Teaspoon -- Oral - Pudding Cup -- Oral - Honey Teaspoon -- Oral - Honey Cup -- Oral - Nectar Teaspoon -- Oral - Nectar Cup -- Oral - Nectar Straw -- Oral - Thin Teaspoon -- Oral - Thin Cup -- Oral - Thin Straw -- Oral - Puree -- Oral - Mech Soft -- Oral - Regular -- Oral - Multi-Consistency -- Oral - Pill -- Oral Phase - Comment --  CHL IP PHARYNGEAL PHASE 05/25/2020 Pharyngeal Phase Impaired Pharyngeal- Pudding Teaspoon -- Pharyngeal -- Pharyngeal- Pudding Cup -- Pharyngeal -- Pharyngeal- Honey Teaspoon -- Pharyngeal -- Pharyngeal- Honey Cup -- Pharyngeal -- Pharyngeal- Nectar Teaspoon Delayed swallow initiation-vallecula;Pharyngeal residue -  valleculae;Reduced laryngeal elevation;Reduced anterior laryngeal mobility Pharyngeal -- Pharyngeal- Nectar Cup Pharyngeal residue - valleculae;Reduced anterior laryngeal mobility;Reduced laryngeal elevation Pharyngeal -- Pharyngeal- Nectar Straw -- Pharyngeal -- Pharyngeal- Thin Teaspoon -- Pharyngeal -- Pharyngeal- Thin Cup Pharyngeal residue - valleculae;Penetration/Apiration after swallow;Penetration/Aspiration during swallow;Reduced laryngeal elevation;Reduced anterior laryngeal mobility Pharyngeal Material enters airway, passes BELOW cords without attempt by patient to eject out (silent aspiration) Pharyngeal- Thin Straw Pharyngeal residue - valleculae Pharyngeal -- Pharyngeal- Puree -- Pharyngeal -- Pharyngeal- Mechanical Soft -- Pharyngeal -- Pharyngeal- Regular Pharyngeal residue - valleculae Pharyngeal -- Pharyngeal- Multi-consistency -- Pharyngeal --  Pharyngeal- Pill -- Pharyngeal -- Pharyngeal Comment --  CHL IP CERVICAL ESOPHAGEAL PHASE 05/25/2020 Cervical Esophageal Phase (No Data) Pudding Teaspoon -- Pudding Cup -- Honey Teaspoon -- Honey Cup -- Nectar Teaspoon -- Nectar Cup -- Nectar Straw -- Thin Teaspoon -- Thin Cup -- Thin Straw -- Puree -- Mechanical Soft -- Regular -- Multi-consistency -- Pill -- Cervical Esophageal Comment -- [name] 05/25/2020, 1:13 PM                LOS: 5 days   Oren Binet, MD  [facility]    To contact the attending provider between 7A-7P or the covering provider during after hours 7P-7A, please log into the web site [email or web address] and access using universal [place] password for that web site. If you do not have the password, please call the hospital operator.  05/26/2020, 11:20 AM

## 2020-05-26 NOTE — Progress Notes (Signed)
Suctioned patient during routine trach check patients heart rate increased to 196 came back down to 134 within a few minutes.

## 2020-05-26 NOTE — Progress Notes (Signed)
Progress Note  Patient Name: [name] Date of Encounter: 05/26/2020  [facility] Cardiologist: No primary care provider on file. AFib clinic  Subjective   Appears comfortable at rest.  Inpatient Medications    Scheduled Meds: . chlorhexidine  15 mL Mouth Rinse BID  . Chlorhexidine Gluconate Cloth  6 each Topical Daily  . diltiazem  120 mg Oral Daily  . mouth rinse  15 mL Mouth Rinse q12n4p  . methylPREDNISolone (SOLU-MEDROL) injection  20 mg Intravenous Q12H  . metoprolol tartrate  12.5 mg Oral TID  . mometasone-formoterol  2 puff Inhalation BID   Continuous Infusions: . sodium chloride Stopped (05/24/20 1450)  . dextrose 5 % and 0.9% NaCl Stopped (05/25/20 1026)   PRN Meds: sodium chloride, acetaminophen **OR** acetaminophen, ipratropium-albuterol, metoprolol tartrate, ondansetron **OR** ondansetron (ZOFRAN) IV   Vital Signs    Vitals:   05/25/20 2206 05/26/20 0431 05/26/20 0738 05/26/20 0810  BP: 119/80 113/78  (!) 131/68  Pulse: 90 83 (!) 120   Resp:   18 23  Temp: 99.5 F (37.5 C) 98 F (36.7 C)  98.3 F (36.8 C)  TempSrc: Oral Oral  Oral  SpO2: 94% 95% 94% 93%  Weight:      Height:        Intake/Output Summary (Last 24 hours) at 05/26/2020 1013 Last data filed at 05/26/2020 0656 Gross per 24 hour  Intake 71.28 ml  Output 350 ml  Net -278.72 ml   Last 3 Weights 05/24/2020 05/23/2020 05/21/2020  Weight (lbs) 100 lb 15.5 oz 102 lb 11.8 oz 110 lb 3.2 oz  Weight (kg) 45.8 kg 46.6 kg 49.986 kg      Telemetry    AFib, rate controlled most of the day, marked RVR with suctioning - Personally Reviewed  ECG    No new tracing - Personally Reviewed  Physical Exam   GEN: No acute distress.   Neck: tracheostomy; No JVD Cardiac: irregular, no murmurs, rubs, or gallops.  Respiratory: Clear to auscultation bilaterally. GI: Soft, nontender, non-distended  MS: No edema; No deformity. Neuro:  Nonfocal  Psych: Normal affect   Labs    High  Sensitivity Troponin:  No results for input(s): TROPONINIHS in the last 720 hours.    Chemistry Recent Labs  Lab 05/24/20 0119 05/25/20 0316 05/26/20 0221  NA 140 139 138  K 3.7 3.9 3.8  CL 110 107 105  CO2 25 24 24   GLUCOSE 138* 138* 137*  BUN 16 15 17   CREATININE 0.83 0.91 0.75  CALCIUM 8.6* 8.5* 8.5*  PROT 5.6* 5.7* 5.9*  ALBUMIN 2.7* 2.7* 2.9*  AST 14* 14* 13*  ALT 11 11 11   ALKPHOS 44 45 49  BILITOT 0.8 0.9 1.2  GFRNONAA >60 >60 >60  GFRAA >60 >60 >60  ANIONGAP 5 8 9      Hematology Recent Labs  Lab 05/24/20 0119 05/25/20 0316 05/26/20 0221  WBC 16.9* 16.5* 23.3*  RBC 4.14* 4.15* 4.41  HGB 11.6* 12.1* 12.5*  HCT 33.2* 33.3* 35.1*  MCV 80.2 80.2 79.6*  MCH 28.0 29.2 28.3  MCHC 34.9 36.3* 35.6  RDW 13.4 13.4 13.3  PLT 173 259 182    BNP Recent Labs  Lab 05/21/20 1858  BNP 284.5*     DDimer No results for input(s): DDIMER in the last 168 hours.   Radiology    DG Swallowing Func-Speech Pathology  Result Date: 05/25/2020 Objective Swallowing Evaluation: Type of Study: MBS-Modified Barium Swallow Study  Patient Details Name: [name]  MRN: [id number] Date of Birth: [birth year] Today's Date: 05/25/2020 Time: SLP Start Time (ACUTE ONLY): 1045 -SLP Stop Time (ACUTE ONLY): 1103 SLP Time Calculation (min) (ACUTE ONLY): 18 min Past Medical History: Past Medical History: Diagnosis Date . Alcohol abuse  . Aortic atherosclerosis ([place])   a. 04/2020 noted on CT. Marland Kitchen Cardiomyopathy ([place])   a. 12/2001 Echo: EF 55-65%; b. 02/2020 Echo: EF 45-50%, glob HK. Mild LVH of the basal segment.  Mildly reduced RV fxn. Nl RVSP. Mild MR. Triv AI. Marland Kitchen COPD (chronic obstructive pulmonary disease) ([place])  . Persistent atrial fibrillation ([place])  . Seizures ([place])  . Squamous cell carcinoma - supraglottic mass   a. 04/2020 CT Head/neck: 3.3 x 3.7 x 3.0 cm R supraglottic mass consistent w/ squamous cell carcinoma. Bilat lymph nodes concerning for malignancy. Past Surgical History: Past Surgical  History: Procedure Laterality Date . DIRECT LARYNGOSCOPY N/A 05/23/2020  Procedure: DIRECT LARYNGOSCOPY with BIOPSY;  Surgeon: Melissa Montane, MD;  Location: [facility];  Service: ENT;  Laterality: N/A; . TRACHEOSTOMY TUBE PLACEMENT N/A 05/23/2020  Procedure: TRACHEOSTOMY;  Surgeon: Melissa Montane, MD;  Location: [facility];  Service: ENT;  Laterality: N/A; HPI:  Patient is a 79 y.o. 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 He has a trach and ENT advised againg Passy-Muir speaking valve presently. His laryngeal elevation and anterior hyoid excurison is decreased and mass appears to obstruct laryngeal vestibule and in turn appears to prevent greater penetration/aspiration. There was mild-moderate vallecular residue consistently. Minimal amount of barium, seen close to and below vocal folds, possibly entered after the initial swallow. Pt began to have tachycardia, which RN was aware of. Towards end of study SAWT recommended pt go back to room and no strategies were attempted. Recommend regular texture and thin liquids, crushed meds. Therapist was made aware of pt's disposition plans to hospice today.        SLP Visit Diagnosis Dysphagia, pharyngeal phase (R13.13) Attention and concentration deficit following -- Frontal lobe and executive function deficit following -- Impact on safety and function Mild aspiration risk;Moderate aspiration risk   CHL IP TREATMENT RECOMMENDATION 05/25/2020 Treatment Recommendations Other (Comment)   Prognosis 07/12/2016 Prognosis for Safe Diet Advancement Fair Barriers to Reach  Goals Cognitive deficits Barriers/Prognosis Comment -- CHL IP DIET RECOMMENDATION 05/25/2020 SLP Diet Recommendations Regular solids;Thin liquid Liquid Administration via Cup;Straw Medication Administration Crushed with puree Compensations Slow rate;Small sips/bites Postural Changes Seated upright at 90 degrees   CHL IP OTHER RECOMMENDATIONS 05/25/2020 Recommended Consults -- Oral Care Recommendations Oral care BID Other Recommendations --   CHL IP FOLLOW UP RECOMMENDATIONS 05/25/2020 Follow up Recommendations None   CHL IP FREQUENCY AND DURATION 07/12/2016 Speech Therapy Frequency (ACUTE ONLY) min 2x/week Treatment Duration 1 week      CHL IP ORAL PHASE 05/25/2020 Oral Phase WFL Oral - Pudding Teaspoon -- Oral - Pudding Cup -- Oral - Honey Teaspoon -- Oral - Honey Cup -- Oral - Nectar Teaspoon -- Oral - Nectar Cup -- Oral - Nectar Straw -- Oral - Thin Teaspoon -- Oral - Thin Cup -- Oral - Thin Straw -- Oral - Puree -- Oral - Mech Soft -- Oral - Regular -- Oral -  Multi-Consistency -- Oral - Pill -- Oral Phase - Comment --  CHL IP PHARYNGEAL PHASE 05/25/2020 Pharyngeal Phase Impaired Pharyngeal- Pudding Teaspoon -- Pharyngeal -- Pharyngeal- Pudding Cup -- Pharyngeal -- Pharyngeal- Honey Teaspoon -- Pharyngeal -- Pharyngeal- Honey Cup -- Pharyngeal -- Pharyngeal- Nectar Teaspoon Delayed swallow initiation-vallecula;Pharyngeal residue - valleculae;Reduced laryngeal elevation;Reduced anterior laryngeal mobility Pharyngeal -- Pharyngeal- Nectar Cup Pharyngeal residue - valleculae;Reduced anterior laryngeal mobility;Reduced laryngeal elevation Pharyngeal -- Pharyngeal- Nectar Straw -- Pharyngeal -- Pharyngeal- Thin Teaspoon -- Pharyngeal -- Pharyngeal- Thin Cup Pharyngeal residue - valleculae;Penetration/Apiration after swallow;Penetration/Aspiration during swallow;Reduced laryngeal elevation;Reduced anterior laryngeal mobility Pharyngeal Material enters airway, passes BELOW cords without attempt by patient to eject out (silent  aspiration) Pharyngeal- Thin Straw Pharyngeal residue - valleculae Pharyngeal -- Pharyngeal- Puree -- Pharyngeal -- Pharyngeal- Mechanical Soft -- Pharyngeal -- Pharyngeal- Regular Pharyngeal residue - valleculae Pharyngeal -- Pharyngeal- Multi-consistency -- Pharyngeal -- Pharyngeal- Pill -- Pharyngeal -- Pharyngeal Comment --  CHL IP CERVICAL ESOPHAGEAL PHASE 05/25/2020 Cervical Esophageal Phase (No Data) Pudding Teaspoon -- Pudding Cup -- Honey Teaspoon -- Honey Cup -- Nectar Teaspoon -- Nectar Cup -- Nectar Straw -- Thin Teaspoon -- Thin Cup -- Thin Straw -- Puree -- Mechanical Soft -- Regular -- Multi-consistency -- Pill -- Cervical Esophageal Comment -- [name] 05/25/2020, 1:13 PM               Cardiac Studies   ECHO 03/23/2020  1. Left ventricular ejection fraction, by estimation, is 45 to 50%. The  left ventricle has normal function. The left ventricle demonstrates global  hypokinesis. There is mild left ventricular hypertrophy of the basal  segment. Left ventricular diastolic  function could not be evaluated.  2. Right ventricular systolic function is mildly reduced. The right  ventricular size is normal. There is normal pulmonary artery systolic  pressure.  3. The mitral valve is grossly normal. Mild mitral valve regurgitation.  4. The aortic valve is tricuspid. Aortic valve regurgitation is trivial.  Mild aortic valve sclerosis is present, with no evidence of aortic valve  stenosis.  5. The inferior vena cava is normal in size with greater than 50%  respiratory variability, suggesting right atrial pressure of 3 mmHg.   Patient Profile     79 y.o. male with a history of persistent afib, etoh abuse, tob abuse, COPD, pulm fibrosis, and seizures and squamous call Ca of the larynx, developing recurrent episodes of RVR on background of well-rate controlled AFib  Assessment & Plan    As has been the pattern, has marked RVR with suctioning, otherwise well rate controlled  and with occasional mild pauses up to 2.5" while asleep. Will add a very low dose of beta blocker and monitor ventricular rate. If he develops longer pauses and/or bradycardia, may simply need to tolerate the episodes of RVR with stimulation/suctioning.     For questions or updates, please contact [facility] Please consult [email or web address] for contact info under        Signed, Sanda Klein, MD  05/26/2020, 10:13 AM

## 2020-05-26 NOTE — Progress Notes (Signed)
Subjective: No new issues overnight.  Case discussed with the palliative care team.  Appreciate their involvement and input.  Objective: Vital signs in last 24 hours: Temp:  [98 F (36.7 C)-99.5 F (37.5 C)] 98.3 F (36.8 C) (07/28 0810) Pulse Rate:  [75-120] 95 (07/28 1141) Resp:  [16-23] 16 (07/28 1141) BP: (113-145)/(68-87) 131/68 (07/28 1141) SpO2:  [93 %-100 %] 95 % (07/28 1141) FiO2 (%):  [21 %] 21 % (07/28 1141) Wt Readings from Last 1 Encounters:  05/24/20 45.8 kg    Intake/Output from previous day: 07/27 0701 - 07/28 0700 In: 211.3 [I.V.:211.3] Out: 400 [Urine:400] Intake/Output this shift: No intake/output data recorded.  Lurline Idol is working well.  No bleeding.  Recent Labs    05/25/20 0316 05/26/20 0221  WBC 16.5* 23.3*  HGB 12.1* 12.5*  HCT 33.3* 35.1*  PLT 259 182    Recent Labs    05/25/20 0316 05/26/20 0221  NA 139 138  K 3.9 3.8  CL 107 105  CO2 24 24  GLUCOSE 138* 137*  BUN 15 17  CREATININE 0.91 0.75  CALCIUM 8.5* 8.5*    Medications: I have reviewed the patient's current medications.  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  Mr. Schiefelbein and I had a long discussion regarding his treatment options including surgery, radiation, and chemotherapy.  We also discussed the option of foregoing any curative treatment and allowing "nature to take its course".  While the patient has some hearing loss and obvious difficulty speaking with trach in place, he is oriented and in my opinion seems competent to weigh in on his treatment plan.  He understood why he was here and remembered the reasons for tracheostomy placement.  He also understood that he has cancer of the larynx.  He voiced an understanding of the course of his disease if left untreated as well as the potential complications or challenges with treatment.  I discussed  the possibility that even with treatment his cancer may persist or recur and that he may require a permanent tracheostomy or feeding tube.  In my experience, patients with this type cancer typically are able to tolerate a diet by mouth after treatment even if augmented by a PEG tube.  Second, it is not uncommon for patients to either be decannulated entirely or downsized to a trach tube without a cuff that can be fitted with a speech valve and allow for communication.  The patient certainly has a surgical option which would include a total laryngectomy and neck dissection.  He would need postoperative radiotherapy.  Given his overall condition, I do not think surgery is his best treatment option primarily.  I would recommend primary radiation therapy.  While I will defer to the medical oncologists, the potential downside medical results of additional chemotherapy probably outweigh the slight survival benefit chemotherapy will confer.  In summary, my recommendation is that the patient move forward with PEG tube placement at least as a temporary intervention and proceed with primary XRT to his larynx and neck.  At the request of the palliative care team, I have not contacted her communicated with the patient's sister regarding my recommendations but would be happy to do so.  Please call me directly at [phone] with questions.  Thank you.   LOS: 5 days   Marcina Millard 05/26/2020, 12:58 PM

## 2020-05-27 ENCOUNTER — Inpatient Hospital Stay (HOSPITAL_COMMUNITY): Payer: Medicare Other

## 2020-05-27 ENCOUNTER — Encounter (HOSPITAL_COMMUNITY): Payer: Self-pay | Admitting: Internal Medicine

## 2020-05-27 DIAGNOSIS — R531 Weakness: Secondary | ICD-10-CM

## 2020-05-27 DIAGNOSIS — I4821 Permanent atrial fibrillation: Secondary | ICD-10-CM | POA: Diagnosis not present

## 2020-05-27 DIAGNOSIS — D649 Anemia, unspecified: Secondary | ICD-10-CM

## 2020-05-27 DIAGNOSIS — Z930 Tracheostomy status: Secondary | ICD-10-CM

## 2020-05-27 DIAGNOSIS — J387 Other diseases of larynx: Secondary | ICD-10-CM | POA: Diagnosis not present

## 2020-05-27 DIAGNOSIS — Z7189 Other specified counseling: Secondary | ICD-10-CM | POA: Diagnosis not present

## 2020-05-27 DIAGNOSIS — R1310 Dysphagia, unspecified: Secondary | ICD-10-CM

## 2020-05-27 DIAGNOSIS — C329 Malignant neoplasm of larynx, unspecified: Secondary | ICD-10-CM

## 2020-05-27 DIAGNOSIS — R0602 Shortness of breath: Secondary | ICD-10-CM | POA: Diagnosis not present

## 2020-05-27 DIAGNOSIS — E43 Unspecified severe protein-calorie malnutrition: Secondary | ICD-10-CM | POA: Insufficient documentation

## 2020-05-27 DIAGNOSIS — Z515 Encounter for palliative care: Secondary | ICD-10-CM

## 2020-05-27 DIAGNOSIS — I4891 Unspecified atrial fibrillation: Secondary | ICD-10-CM | POA: Diagnosis not present

## 2020-05-27 LAB — COMPREHENSIVE METABOLIC PANEL
ALT: 11 U/L (ref 0–44)
AST: 13 U/L — ABNORMAL LOW (ref 15–41)
Albumin: 2.5 g/dL — ABNORMAL LOW (ref 3.5–5.0)
Alkaline Phosphatase: 44 U/L (ref 38–126)
Anion gap: 10 (ref 5–15)
BUN: 18 mg/dL (ref 8–23)
CO2: 23 mmol/L (ref 22–32)
Calcium: 8.3 mg/dL — ABNORMAL LOW (ref 8.9–10.3)
Chloride: 103 mmol/L (ref 98–111)
Creatinine, Ser: 0.82 mg/dL (ref 0.61–1.24)
GFR calc Af Amer: >60 mL/min (ref >60)
GFR calc non Af Amer: >60 mL/min (ref >60)
Glucose, Bld: 173 mg/dL — ABNORMAL HIGH (ref 70–99)
Potassium: 3.7 mmol/L (ref 3.5–5.1)
Sodium: 136 mmol/L (ref 135–145)
Total Bilirubin: 0.9 mg/dL (ref 0.3–1.2)
Total Protein: 5.5 g/dL — ABNORMAL LOW (ref 6.5–8.1)

## 2020-05-27 LAB — CBC
HCT: 31.5 % — ABNORMAL LOW (ref 39.0–52.0)
Hemoglobin: 11.1 g/dL — ABNORMAL LOW (ref 13.0–17.0)
MCH: 28.2 pg (ref 26.0–34.0)
MCHC: 35.2 g/dL (ref 30.0–36.0)
MCV: 79.9 fL — ABNORMAL LOW (ref 80.0–100.0)
Platelets: 174 10*3/uL (ref 150–400)
RBC: 3.94 MIL/uL — ABNORMAL LOW (ref 4.22–5.81)
RDW: 13.4 % (ref 11.5–15.5)
WBC: 26.6 10*3/uL — ABNORMAL HIGH (ref 4.0–10.5)
nRBC: 0 % (ref 0.0–0.2)

## 2020-05-27 MED ORDER — IOHEXOL 300 MG/ML  SOLN
75.0000 mL | Freq: Once | INTRAMUSCULAR | Status: AC | PRN
  Administered 2020-05-27: 75 mL via INTRAVENOUS

## 2020-05-27 MED ORDER — METOPROLOL TARTRATE 12.5 MG HALF TABLET
12.5000 mg | ORAL_TABLET | Freq: Two times a day (BID) | ORAL | Status: DC
  Administered 2020-05-27: 12.5 mg via ORAL
  Filled 2020-05-27: qty 1

## 2020-05-27 NOTE — Evaluation (Signed)
Occupational Therapy Evaluation Patient Details Name: [name] MRN: [id number] DOB: 14-Sep-1941 Today's Date: 05/27/2020    History of Present Illness 79yo male presenting to the ED with increased stridor and SOB, recent history of choking on his food. 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 Received direct laryngoscopy and trach tube placement 05/23/20. PMH supraglottic carcinoma/mass, hx seizurse, A-fib, COPD, cardiomyopathy, alcohol abuse   Clinical Impression   PTA, pt lives at ALF and reports light assistance with LB bathing/dressing. Pt reports able to ambulate without AD. Pt presents now with diagnoses above and deficits in strength, cardiopulmonary tolerance, and dynamic standing balance. Pt motivated to sit up in recliner today for breakfast, requiring min guard for sit to stand transfers and Mod A for stand pivot to maintain stability and line safety. Pt requires up to Mod A for LB ADLs due to deficits. Pt HR up to 135bpm during activities, requiring initial education of energy conservation strategies with pt receptive of education. Recommend short term rehab at SNF to maximize ADL/mobility independence.     Follow Up Recommendations  SNF    Equipment Recommendations  Other (comment) (TBD)    Recommendations for Other Services       Precautions / Restrictions Precautions Precautions: Fall;Other (comment) Precaution Comments: trach, watch vitals especially HR/O2 Restrictions Weight Bearing Restrictions: No      Mobility Bed Mobility Overal bed mobility: Needs Assistance Bed Mobility: Supine to Sit     Supine to sit: Min guard;HOB elevated     General bed mobility comments: min guard for safety with lines and sequencing  Transfers Overall transfer level: Needs assistance Equipment used: 1 person hand held assist;None Transfers: Sit to/from [facility] Sit to Stand: Min guard Stand pivot transfers: Mod assist        General transfer comment: Min guard for sit to stand, no physical assist needed to boost up. Pt progressed to requiring Mod A for pivot due to decreased balance, difficulty pacing and impulsiveness with line tangles    Balance Overall balance assessment: Needs assistance Sitting-balance support: No upper extremity supported;Feet supported Sitting balance-Leahy Scale: Fair Sitting balance - Comments: close supervision statically   Standing balance support: Single extremity supported;During functional activity Standing balance-Leahy Scale: Poor Standing balance comment: Min A for maintaining balance, pt reliant on reaching for armrests with B UE for support                           ADL either performed or assessed with clinical judgement   ADL Overall ADL's : Needs assistance/impaired Eating/Feeding: Supervision/ safety;Sitting Eating/Feeding Details (indicate cue type and reason): Setup/supervision for breakfast Grooming: Set up;Sitting;Wash/dry face Grooming Details (indicate cue type and reason): Setup to wash face sitting EOB Upper Body Bathing: Minimal assistance;Sitting Upper Body Bathing Details (indicate cue type and reason): Min A for bathing back sitting in chair Lower Body Bathing: Minimal assistance Lower Body Bathing Details (indicate cue type and reason): Min A for maintaining steadiness in standing and lower feet due to fatigue Upper Body Dressing : Minimal assistance Upper Body Dressing Details (indicate cue type and reason): Min A to don new hospital gown in chair, multiple cues needed for safety with line Lower Body Dressing: Moderate assistance;Sit to/from stand   Toilet Transfer: Stand-pivot;Moderate assistance Toilet Transfer Details (indicate cue type and reason): Simulated to recliner chair. Grossly Mod A for stand pivot without AD. Cues needed to maintain safe sequencing and avoid  line tangling as pt a bit impulsive Toileting- Clothing Manipulation  and Hygiene: Moderate assistance;Sit to/from stand         General ADL Comments: Pt presents with decreased endurance (HR up to 135 with activity), decreased strength, and decreased standing balance impacting safety with ADLs/transfers     Vision Baseline Vision/History: No visual deficits Patient Visual Report: No change from baseline Vision Assessment?: No apparent visual deficits     Perception     Praxis      Pertinent Vitals/Pain Pain Assessment: No/denies pain Faces Pain Scale: No hurt Pain Intervention(s): Monitored during session     Hand Dominance Right   Extremity/Trunk Assessment Upper Extremity Assessment Upper Extremity Assessment: Generalized weakness   Lower Extremity Assessment Lower Extremity Assessment: Defer to PT evaluation   Cervical / Trunk Assessment Cervical / Trunk Assessment: Kyphotic   Communication Communication Communication: Tracheostomy   Cognition Arousal/Alertness: Awake/alert Behavior During Therapy: Flat affect Overall Cognitive Status: No family/caregiver present to determine baseline cognitive functioning                                 General Comments: followed simple commands very well and able to answer yes/no to simple questions; communication limited by trach collar; no family present to help determine true baseline   General Comments  Pt on 5 L O2 w/ trach WFL throughout. HR up to 135bpm with activity and after transfer. BP low this AM but after transfer 125/88. Pt endorsed fatigue and SOB, able to recognize increasing HR    Exercises     Shoulder Instructions      Home Living Family/patient expects to be discharged to:: Assisted living                                 Additional Comments: Pt reports light staff assistance with ADLs, able to ambulate without AD      Prior Functioning/Environment Level of Independence: Needs assistance  Gait / Transfers Assistance Needed: Pt report  Independent with gait without AD ADL's / Homemaking Assistance Needed: Pt reports light assistance with LB bathing/dressing            OT Problem List: Decreased strength;Decreased activity tolerance;Impaired balance (sitting and/or standing);Decreased safety awareness;Decreased knowledge of use of DME or AE;Cardiopulmonary status limiting activity      OT Treatment/Interventions: Self-care/ADL training;Therapeutic exercise;Energy conservation;DME and/or AE instruction;Therapeutic activities;Patient/family education    OT Goals(Current goals can be found in the care plan section) Acute Rehab OT Goals Patient Stated Goal: unable to state specifically, but motivated to get to recliner  OT Goal Formulation: With patient (difficulty with specific goals due to trach) Time For Goal Achievement: 06/10/20 Potential to Achieve Goals: Good ADL Goals Pt Will Perform Lower Body Bathing: with min guard assist Pt Will Perform Lower Body Dressing: with min guard assist Pt Will Transfer to Toilet: with min guard assist;stand pivot transfer;bedside commode Pt Will Perform Toileting - Clothing Manipulation and hygiene: with min assist;sit to/from stand Additional ADL Goal #1: Pt to demonstrate implementation of at least 3 energy conservation strategies during ADLs to maximize independence  OT Frequency: Min 2X/week   Barriers to D/C:            Co-evaluation              AM-PAC OT "6 Clicks" Daily Activity     Outcome  Measure Help from another person eating meals?: A Little Help from another person taking care of personal grooming?: A Little Help from another person toileting, which includes using toliet, bedpan, or urinal?: A Lot Help from another person bathing (including washing, rinsing, drying)?: A Little Help from another person to put on and taking off regular upper body clothing?: A Little Help from another person to put on and taking off regular lower body clothing?: A Lot 6  Click Score: 16   End of Session Equipment Utilized During Treatment: Oxygen Nurse Communication: Mobility status  Activity Tolerance: Patient tolerated treatment well Patient left: in chair;with call bell/phone within reach;Other (comment);with SCD's reapplied (with chair alarm pad, notified nursing no chair alarm box)  OT Visit Diagnosis: Unsteadiness on feet (R26.81);Other abnormalities of gait and mobility (R26.89);Muscle weakness (generalized) (M62.81)                Time: 0045-9977 OT Time Calculation (min): 39 min Charges:  OT General Charges $OT Visit: 1 Visit OT Evaluation $OT Eval Moderate Complexity: 1 Mod OT Treatments $Self Care/Home Management : 8-22 mins $Therapeutic Activity: 8-22 mins  Layla Maw, OTR/L  Layla Maw 05/27/2020, 9:34 AM

## 2020-05-27 NOTE — Progress Notes (Addendum)
HEMATOLOGY-ONCOLOGY PROGRESS NOTE  SUBJECTIVE: [name] is sitting up in the recliner chair at the time of my visit.  No family at the bedside.  He has no complaints today.  Discussed with nursing who has no concerns.  He was seen by IR earlier today who is planning for PEG tube placement.  Awaiting transfer to [facility] long hospital for consideration of radiation.   REVIEW OF SYSTEMS:   Noncontributory except as noted in the HPI.  PAST MEDICAL HISTORY:     Past Medical History:  Diagnosis Date   Alcohol abuse     Aortic atherosclerosis ([place])      a. 04/2020 noted on CT.   Cardiomyopathy ([place])      a. 12/2001 Echo: EF 55-65%; b. 02/2020 Echo: EF 45-50%, glob HK. Mild LVH of the basal segment.  Mildly reduced RV fxn. Nl RVSP. Mild MR. Triv AI.   COPD (chronic obstructive pulmonary disease) (HCC)     Persistent atrial fibrillation (HCC)     Seizures (HCC)     Squamous cell carcinoma - supraglottic mass      a. 04/2020 CT Head/neck: 3.3 x 3.7 x 3.0 cm R supraglottic mass consistent w/ squamous cell carcinoma. Bilat lymph nodes concerning for malignancy.    PAST SURGICAL HISTORY:      Past Surgical History:  Procedure Laterality Date   DIRECT LARYNGOSCOPY N/A 05/23/2020    Procedure: DIRECT LARYNGOSCOPY with BIOPSY;  Surgeon: Melissa Montane, MD;  Location: [place];  Service: ENT;  Laterality: N/A;   TRACHEOSTOMY TUBE PLACEMENT N/A 05/23/2020    Procedure: TRACHEOSTOMY;  Surgeon: Melissa Montane, MD;  Location: [facility] OR;  Service: ENT;  Laterality: N/A;    FAMILY HISTORY      Family History  Problem Relation Age of Onset   Hypertension Other      SOCIAL HISTORY: The patient has 2 brothers who are deceased and 4 sisters who are living.  [name] is the main sibling that has been caring for [name].  [name] has 1 daughter and 1 son, but they do not speak to [name].  [name] lives with [name] for 2 years and then about 2 months ago became a resident of [facility] (assisted living).   The patient has a remote history of alcohol and tobacco use.   ADVANCED DIRECTIVES: DNR/DNI   HEALTH MAINTENANCE: Social History         Tobacco Use   Smoking status: Former Smoker   Smokeless tobacco: Never Used  Substance Use Topics   Alcohol use: Not Currently      Alcohol/week: 6.0 standard drinks      Types: 6 Cans of beer per week   Drug use: No    PHYSICAL EXAMINATION: ECOG PERFORMANCE STATUS: 3 - Symptomatic, >50% confined to bed  Vitals:   05/27/20 1508 05/27/20 1600  BP:  127/72  Pulse: 70 72  Resp: 20 (!) 27  Temp:  97.7 F (36.5 C)  SpO2: 93% 98%   Filed Weights   05/21/20 1900 05/23/20 2126 05/24/20 2101  Weight: 50 kg 46.6 kg 45.8 kg    Intake/Output from previous day: 07/28 0701 - 07/29 0700 In: 778.3 [P.O.:720; I.V.:58.3] Out: 900 [Urine:900]  GENERAL: Chronically ill-appearing male, no distress, cachectic NECK: Tracheostomy midline LUNGS: clear to auscultation and percussion with normal breathing effort HEART: Irregularly irregular ABDOMEN:abdomen soft, non-tender and normal bowel sounds NEURO: Nonfocal  LABORATORY DATA:  I have reviewed the data as listed CMP Latest Ref Rng &  Units 05/27/2020 05/26/2020 05/25/2020  Glucose 70 - 99 mg/dL 173(H) 137(H) 138(H)  BUN 8 - 23 mg/dL 18 17 15   Creatinine 0.61 - 1.24 mg/dL 0.82 0.75 0.91  Sodium 135 - 145 mmol/L 136 138 139  Potassium 3.5 - 5.1 mmol/L 3.7 3.8 3.9  Chloride 98 - 111 mmol/L 103 105 107  CO2 22 - 32 mmol/L 23 24 24   Calcium 8.9 - 10.3 mg/dL 8.3(L) 8.5(L) 8.5(L)  Total Protein 6.5 - 8.1 g/dL 5.5(L) 5.9(L) 5.7(L)  Total Bilirubin 0.3 - 1.2 mg/dL 0.9 1.2 0.9  Alkaline Phos 38 - 126 U/L 44 49 45  AST 15 - 41 U/L 13(L) 13(L) 14(L)  ALT 0 - 44 U/L 11 11 11     Lab Results  Component Value Date   WBC 26.6 (H) 05/27/2020   HGB 11.1 (L) 05/27/2020   HCT 31.5 (L) 05/27/2020   MCV 79.9 (L) 05/27/2020   PLT 174 05/27/2020   NEUTROABS 5.3 05/21/2020    CT Soft Tissue Neck W  Contrast  Result Date: 05/21/2020 CLINICAL DATA:  Right laryngeal mass.  Stridor. EXAM: CT NECK WITH CONTRAST TECHNIQUE: Multidetector CT imaging of the neck was performed using the standard protocol following the bolus administration of intravenous contrast. CONTRAST:  44mL OMNIPAQUE IOHEXOL 300 MG/ML  SOLN COMPARISON:  CT chest 07/10/2016 and 03/25/2020 FINDINGS: Pharynx and larynx: Nasopharynx is clear. Soft palate is within normal limits. Tongue base and palatine tonsils are normal. The epiglottis is within normal limits. Soft tissue tumor fills the right area epiglottic fold. Tumor measures 3.3 x 3.7 x 3.0 cm. Tumor crosses midline posteriorly. Tumor extends at least 5 mm below the right vocal cord. Lower trachea is within normal limits. Salivary glands: Submandibular and parotid glands and ducts are within normal limits. Thyroid: Normal Lymph nodes: Right level 2 rounded nodes measuring 7 and 8 mm raise some concern for malignancy. Rounded posterior left level 3 lymph nodes are present. Vascular: Atherosclerotic changes are present at the carotid bifurcations bilaterally without significant stenosis. Additional calcifications are present at the origin of the left subclavian artery without significant stenosis. Limited intracranial: Negative. Visualized orbits: The globes and orbits are within normal limits. Mastoids and visualized paranasal sinuses: The paranasal sinuses and mastoid air cells are clear. Skeleton: Multilevel degenerative changes the cervical spine are most severe at C6-7 and C7-T1. 2 body heights are maintained. The patient is edentulous. Upper chest: Centrilobular emphysematous changes are present. Lung apices are otherwise clear. Thoracic inlet is within normal limits. IMPRESSION: 1. 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 Tumor crosses midline posteriorly and extends at least 5 mm below the right vocal cord. 2. Rounded right level 2 and  posterior left level 3 lymph nodes are concerning for malignancy. PET scan of the neck would be useful to evaluate for any malignant nodes. 3. Aortic Atherosclerosis (ICD10-I70.0) and Emphysema (ICD10-J43.9). Electronically Signed   By: San Morelle M.D.   On: 05/21/2020 22:00   DG Chest Port 1 View  Result Date: 05/24/2020 CLINICAL DATA:  Leukocytosis EXAM: PORTABLE CHEST 1 VIEW COMPARISON:  May 21, 2020 chest radiograph and chest CT Mar 25, 2020 FINDINGS: Tracheostomy catheter tip is 5.8 cm above the carina. No pneumothorax evident. There is, however, pneumomediastinum as well as subcutaneous air tracking in the supraclavicular regions bilaterally. Fibrotic type changes noted bilaterally, primarily in the mid and lower lung regions. There is no frank edema or airspace opacity. Heart is upper  normal in size with pulmonary vascularity normal. No adenopathy. No bone lesions. IMPRESSION: Pneumomediastinum and supraclavicular soft tissue air of uncertain etiology. No pneumothorax evident. No tracheostomy present. Question pneumomediastinum and subcutaneous air secondary to recent tracheostomy placement. Areas of fibrotic change bilaterally without frank edema or airspace opacity. Stable cardiac silhouette. Electronically Signed   By: Lowella Grip III M.D.   On: 05/24/2020 05:24   DG Chest Port 1 View  Result Date: 05/21/2020 CLINICAL DATA:  Difficulty breathing, COPD, CHF EXAM: PORTABLE CHEST 1 VIEW COMPARISON:  03/04/2020, 03/25/2020 FINDINGS: Single frontal view of the chest demonstrates a stable cardiac silhouette. There are chronic bilateral areas of scarring, with fibrosis greatest at the lung bases left greater than right. No acute airspace disease. No effusion or pneumothorax. No acute bony abnormalities. IMPRESSION: 1. 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 No acute intrathoracic process. Electronically Signed   By: Randa Ngo M.D.   On:  05/21/2020 19:21   DG Swallowing Func-Speech Pathology  Result Date: 05/25/2020 Objective Swallowing Evaluation: Type of Study: MBS-Modified Barium Swallow Study  Patient Details Name: [name] MRN: [id number] Date of Birth: 12/30/1940 Today's Date: 05/25/2020 Time: SLP Start Time (ACUTE ONLY): 1045 -SLP Stop Time (ACUTE ONLY): 1103 SLP Time Calculation (min) (ACUTE ONLY): 18 min Past Medical History: Past Medical History: Diagnosis Date  Alcohol abuse   Aortic atherosclerosis ([place])   a. 04/2020 noted on CT.  Cardiomyopathy ([place])   a. 12/2001 Echo: EF 55-65%; b. 02/2020 Echo: EF 45-50%, glob HK. Mild LVH of the basal segment.  Mildly reduced RV fxn. Nl RVSP. Mild MR. Triv AI.  COPD (chronic obstructive pulmonary disease) (HCC)   Persistent atrial fibrillation (HCC)   Seizures (HCC)   Squamous cell carcinoma - supraglottic mass   a. 04/2020 CT Head/neck: 3.3 x 3.7 x 3.0 cm R supraglottic mass consistent w/ squamous cell carcinoma. Bilat lymph nodes concerning for malignancy. Past Surgical History: Past Surgical History: Procedure Laterality Date  DIRECT LARYNGOSCOPY N/A 05/23/2020  Procedure: DIRECT LARYNGOSCOPY with BIOPSY;  Surgeon: Melissa Montane, MD;  Location: [place];  Service: ENT;  Laterality: N/A;  TRACHEOSTOMY TUBE PLACEMENT N/A 05/23/2020  Procedure: TRACHEOSTOMY;  Surgeon: Melissa Montane, MD;  Location: [place];  Service: ENT;  Laterality: N/A; HPI:  Patient is a 79 y.o. 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 He has a trach and ENT advised againg Passy-Muir speaking valve  presently. His laryngeal elevation and anterior hyoid excurison is decreased and mass appears to obstruct laryngeal vestibule and in turn appears to prevent greater penetration/aspiration. There was mild-moderate vallecular residue consistently. Minimal amount of barium, seen close to and below vocal folds, possibly entered after the initial swallow. Pt began to have tachycardia, which RN was aware of. Towards end of study SAWT recommended pt go back to room and no strategies were attempted. Recommend regular texture and thin liquids, crushed meds. Therapist was made aware of pt's disposition plans to hospice today.        SLP Visit Diagnosis Dysphagia, pharyngeal phase (R13.13) Attention and concentration deficit following -- Frontal lobe and executive function deficit following -- Impact on safety and function Mild aspiration  risk;Moderate aspiration risk   CHL IP TREATMENT RECOMMENDATION 05/25/2020 Treatment Recommendations Other (Comment)   Prognosis 07/12/2016 Prognosis for Safe Diet Advancement Fair Barriers to Reach Goals Cognitive deficits Barriers/Prognosis Comment -- CHL IP DIET RECOMMENDATION 05/25/2020 SLP Diet Recommendations Regular solids;Thin liquid Liquid Administration via Cup;Straw Medication Administration Crushed with puree Compensations Slow rate;Small sips/bites Postural Changes Seated upright at 90 degrees   CHL IP OTHER RECOMMENDATIONS 05/25/2020 Recommended Consults -- Oral Care Recommendations Oral care BID Other Recommendations --   CHL IP FOLLOW UP RECOMMENDATIONS 05/25/2020 Follow up Recommendations None   CHL IP FREQUENCY AND DURATION 07/12/2016 Speech Therapy Frequency (ACUTE ONLY) min 2x/week Treatment Duration 1 week      CHL IP ORAL PHASE 05/25/2020 Oral Phase WFL Oral - Pudding Teaspoon -- Oral - Pudding Cup -- Oral - Honey Teaspoon -- Oral - Honey Cup -- Oral - Nectar Teaspoon -- Oral - Nectar Cup -- Oral - Nectar Straw -- Oral - Thin Teaspoon -- Oral - Thin Cup -- Oral - Thin Straw --  Oral - Puree -- Oral - Mech Soft -- Oral - Regular -- Oral - Multi-Consistency -- Oral - Pill -- Oral Phase - Comment --  CHL IP PHARYNGEAL PHASE 05/25/2020 Pharyngeal Phase Impaired Pharyngeal- Pudding Teaspoon -- Pharyngeal -- Pharyngeal- Pudding Cup -- Pharyngeal -- Pharyngeal- Honey Teaspoon -- Pharyngeal -- Pharyngeal- Honey Cup -- Pharyngeal -- Pharyngeal- Nectar Teaspoon Delayed swallow initiation-vallecula;Pharyngeal residue - valleculae;Reduced laryngeal elevation;Reduced anterior laryngeal mobility Pharyngeal -- Pharyngeal- Nectar Cup Pharyngeal residue - valleculae;Reduced anterior laryngeal mobility;Reduced laryngeal elevation Pharyngeal -- Pharyngeal- Nectar Straw -- Pharyngeal -- Pharyngeal- Thin Teaspoon -- Pharyngeal -- Pharyngeal- Thin Cup Pharyngeal residue - valleculae;Penetration/Apiration after swallow;Penetration/Aspiration during swallow;Reduced laryngeal elevation;Reduced anterior laryngeal mobility Pharyngeal Material enters airway, passes BELOW cords without attempt by patient to eject out (silent aspiration) Pharyngeal- Thin Straw Pharyngeal residue - valleculae Pharyngeal -- Pharyngeal- Puree -- Pharyngeal -- Pharyngeal- Mechanical Soft -- Pharyngeal -- Pharyngeal- Regular Pharyngeal residue - valleculae Pharyngeal -- Pharyngeal- Multi-consistency -- Pharyngeal -- Pharyngeal- Pill -- Pharyngeal -- Pharyngeal Comment --  CHL IP CERVICAL ESOPHAGEAL PHASE 05/25/2020 Cervical Esophageal Phase (No Data) Pudding Teaspoon -- Pudding Cup -- Honey Teaspoon -- Honey Cup -- Nectar Teaspoon -- Nectar Cup -- Nectar Straw -- Thin Teaspoon -- Thin Cup -- Thin Straw -- Puree -- Mechanical Soft -- Regular -- Multi-consistency -- Pill -- Cervical Esophageal Comment -- [name] 05/25/2020, 1:13 PM               ASSESSMENT: 79 y.o. [place], [place] male with    1) newly diagnosed laryngeal squamous cell carcinoma.  Tumor is at least T3.  Has not yet been fully staged.  He is not a  surgical candidate.   2) dysphagia and protein calorie malnutrition secondary to #1   3) persistent A. fib with RVR   4) hypertension   5) COPD   6) idiopathic pulmonary fibrosis   PLAN: Mr. Dobbs has newly diagnosed laryngeal squamous cell carcinoma.  Discussed with the patient that we need to obtain a CT scan of the chest for initial staging.  Order has been placed.   Case has been reviewed with Dr. Isidore Moos radiation oncology who plans for simulation for radiation to his laryngeal mass.  The patient is awaiting transfer to [facility] long hospital for full evaluation by radiation oncology.  They anticipate beginning radiation sometime next week.  The patient will benefit from PEG tube placement for nutrition.  IR has evaluated the patient and  plans for PEG tube placement soon.  No chemotherapy is planned at this time.     LOS: 6 days   Terry Bussing, [name], [name], [name] 05/27/20   ADDENDUM: 79 y/o [place] man with squamous cell carcinoma of the head and neck pending definitive treatment. CT of the chest by my reading this evening shows chronic changes but no obvious metastases--definitive report pending  Mr Figiel is a good candidate for palliative radiation. I do not believe he would be able to tolerate even minimal chemotherapy. I discussed this with him today.  His main interest is to be able to talk. I wonder if we would be able to provide him with an electrolarynx or speech aid.  The plan from an oncology point of view is transfer to [facility] to facilitate radiation therapy, optimization of nutrition and hydration, and continuing to clarify goals of care with the patient and sister.  I personally saw this patient and performed a substantive portion of this encounter with the listed APP documented above.   Terry Cruel, MD Medical Oncology and Hematology [facility] [address] [place], [place] [address] Tel. [phone]    Fax.  [phone]

## 2020-05-27 NOTE — Progress Notes (Signed)
Progress Note  Patient Name: [name] Date of Encounter: 05/27/2020  [facility] Cardiologist: No primary care provider on file. New (previously seen in Afib clinic)  Subjective   No CV complaints.  After starting beta blockers, the episodes of marked RVR during suction are much less pronounced (ventr rate 120s-130s, rather than 180-190s). Nocturnal heart rate dipping into the low 40s and BP low however. Planning transfer to [facility] for sim/initiation of XRT.  Inpatient Medications    Scheduled Meds: . (feeding supplement) PROSource Plus  30 mL Oral BID BM  . chlorhexidine  15 mL Mouth Rinse BID  . Chlorhexidine Gluconate Cloth  6 each Topical Daily  . diltiazem  120 mg Oral Daily  . feeding supplement (ENSURE ENLIVE)  237 mL Oral TID BM  . mouth rinse  15 mL Mouth Rinse q12n4p  . methylPREDNISolone (SOLU-MEDROL) injection  20 mg Intravenous Q12H  . metoprolol tartrate  12.5 mg Oral TID  . mometasone-formoterol  2 puff Inhalation BID  . multivitamin with minerals  1 tablet Oral Daily   Continuous Infusions: . sodium chloride Stopped (05/24/20 1450)  . dextrose 5 % and 0.9% NaCl 20 mL/hr at 05/27/20 0604   PRN Meds: sodium chloride, acetaminophen **OR** acetaminophen, ipratropium-albuterol, metoprolol tartrate, ondansetron **OR** ondansetron (ZOFRAN) IV   Vital Signs    Vitals:   05/26/20 2319 05/27/20 0409 05/27/20 0725 05/27/20 0726  BP: 127/71 (!) 113/58 (!) 79/61   Pulse: 67 61 63 75  Resp:   14 18  Temp: 98 F (36.7 C) 98.3 F (36.8 C) 98.7 F (37.1 C)   TempSrc: Oral Oral Oral   SpO2: 96% 96% 97% 97%  Weight:      Height:        Intake/Output Summary (Last 24 hours) at 05/27/2020 0843 Last data filed at 05/27/2020 0410 Gross per 24 hour  Intake 480 ml  Output 900 ml  Net -420 ml   Last 3 Weights 05/24/2020 05/23/2020 05/21/2020  Weight (lbs) 100 lb 15.5 oz 102 lb 11.8 oz 110 lb 3.2 oz  Weight (kg) 45.8 kg 46.6 kg 49.986 kg      Telemetry      Nocturnal rates 40-50s, daytime 60-70s, stimulation 120-130s - Personally Reviewed  ECG    No new tracing - Personally Reviewed  Physical Exam  Chronically ill appearance GEN: No acute distress.   Neck: No JVD. tracheostomy Cardiac: irregular, no murmurs, rubs, or gallops.  Respiratory: Clear to auscultation bilaterally. GI: Soft, nontender, non-distended  MS: No edema; No deformity. Neuro:  Nonfocal  Psych: Normal affect   Labs    High Sensitivity Troponin:  No results for input(s): TROPONINIHS in the last 720 hours.    Chemistry Recent Labs  Lab 05/25/20 0316 05/26/20 0221 05/27/20 0235  NA 139 138 136  K 3.9 3.8 3.7  CL 107 105 103  CO2 24 24 23   GLUCOSE 138* 137* 173*  BUN 15 17 18   CREATININE 0.91 0.75 0.82  CALCIUM 8.5* 8.5* 8.3*  PROT 5.7* 5.9* 5.5*  ALBUMIN 2.7* 2.9* 2.5*  AST 14* 13* 13*  ALT 11 11 11   ALKPHOS 45 49 44  BILITOT 0.9 1.2 0.9  GFRNONAA >60 >60 >60  GFRAA >60 >60 >60  ANIONGAP 8 9 10      Hematology Recent Labs  Lab 05/25/20 0316 05/26/20 0221 05/27/20 0235  WBC 16.5* 23.3* 26.6*  RBC 4.15* 4.41 3.94*  HGB 12.1* 12.5* 11.1*  HCT 33.3* 35.1* 31.5*  MCV 80.2  79.6* 79.9*  MCH 29.2 28.3 28.2  MCHC 36.3* 35.6 35.2  RDW 13.4 13.3 13.4  PLT 259 182 174    BNP Recent Labs  Lab 05/21/20 1858  BNP 284.5*     DDimer No results for input(s): DDIMER in the last 168 hours.   Radiology    DG Swallowing Func-Speech Pathology  Result Date: 05/25/2020 Objective Swallowing Evaluation: Type of Study: MBS-Modified Barium Swallow Study  Patient Details Name: [name] MRN: [id number] Date of Birth: 06/02/1941 Today's Date: 05/25/2020 Time: SLP Start Time (ACUTE ONLY): 1045 -SLP Stop Time (ACUTE ONLY): 1103 SLP Time Calculation (min) (ACUTE ONLY): 18 min Past Medical History: Past Medical History: Diagnosis Date . Alcohol abuse  . Aortic atherosclerosis ([place])   a. 04/2020 noted on CT. Marland Kitchen Cardiomyopathy ([place])   a. 12/2001 Echo: EF 55-65%; b.  02/2020 Echo: EF 45-50%, glob HK. Mild LVH of the basal segment.  Mildly reduced RV fxn. Nl RVSP. Mild MR. Triv AI. Marland Kitchen COPD (chronic obstructive pulmonary disease) ([place])  . Persistent atrial fibrillation ([place])  . Seizures ([place])  . Squamous cell carcinoma - supraglottic mass   a. 04/2020 CT Head/neck: 3.3 x 3.7 x 3.0 cm R supraglottic mass consistent w/ squamous cell carcinoma. Bilat lymph nodes concerning for malignancy. Past Surgical History: Past Surgical History: Procedure Laterality Date . DIRECT LARYNGOSCOPY N/A 05/23/2020  Procedure: DIRECT LARYNGOSCOPY with BIOPSY;  Surgeon: Melissa Montane, MD;  Location: [place];  Service: ENT;  Laterality: N/A; . TRACHEOSTOMY TUBE PLACEMENT N/A 05/23/2020  Procedure: TRACHEOSTOMY;  Surgeon: Melissa Montane, MD;  Location: [place];  Service: ENT;  Laterality: N/A; HPI:  Patient is a 79 y.o. 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 He has a trach and ENT advised againg Passy-Muir speaking valve presently. His laryngeal elevation and anterior hyoid excurison is decreased and mass appears to obstruct laryngeal vestibule and in turn appears to prevent greater penetration/aspiration. There was mild-moderate vallecular residue consistently. Minimal amount of barium, seen close to and below vocal folds, possibly entered after the initial swallow. Pt began to have tachycardia, which RN was aware of. Towards end of study SAWT recommended pt go back to room and no strategies were attempted. Recommend regular texture and thin liquids, crushed meds. Therapist was made  aware of pt's disposition plans to hospice today.        SLP Visit Diagnosis Dysphagia, pharyngeal phase (R13.13) Attention and concentration deficit following -- Frontal lobe and executive function deficit following -- Impact on safety and function Mild aspiration risk;Moderate aspiration risk   CHL IP TREATMENT RECOMMENDATION 05/25/2020 Treatment Recommendations Other (Comment)   Prognosis 07/12/2016 Prognosis for Safe Diet Advancement Fair Barriers to Reach Goals Cognitive deficits Barriers/Prognosis Comment -- CHL IP DIET RECOMMENDATION 05/25/2020 SLP Diet Recommendations Regular solids;Thin liquid Liquid Administration via Cup;Straw Medication Administration Crushed with puree Compensations Slow rate;Small sips/bites Postural Changes Seated upright at 90 degrees   CHL IP OTHER RECOMMENDATIONS 05/25/2020 Recommended Consults -- Oral Care Recommendations Oral care BID Other Recommendations --   CHL IP FOLLOW UP RECOMMENDATIONS 05/25/2020 Follow up Recommendations None   CHL IP FREQUENCY AND DURATION 07/12/2016 Speech Therapy Frequency (ACUTE ONLY) min 2x/week Treatment Duration  1 week      CHL IP ORAL PHASE 05/25/2020 Oral Phase WFL Oral - Pudding Teaspoon -- Oral - Pudding Cup -- Oral - Honey Teaspoon -- Oral - Honey Cup -- Oral - Nectar Teaspoon -- Oral - Nectar Cup -- Oral - Nectar Straw -- Oral - Thin Teaspoon -- Oral - Thin Cup -- Oral - Thin Straw -- Oral - Puree -- Oral - Mech Soft -- Oral - Regular -- Oral - Multi-Consistency -- Oral - Pill -- Oral Phase - Comment --  CHL IP PHARYNGEAL PHASE 05/25/2020 Pharyngeal Phase Impaired Pharyngeal- Pudding Teaspoon -- Pharyngeal -- Pharyngeal- Pudding Cup -- Pharyngeal -- Pharyngeal- Honey Teaspoon -- Pharyngeal -- Pharyngeal- Honey Cup -- Pharyngeal -- Pharyngeal- Nectar Teaspoon Delayed swallow initiation-vallecula;Pharyngeal residue - valleculae;Reduced laryngeal elevation;Reduced anterior laryngeal mobility Pharyngeal -- Pharyngeal- Nectar Cup Pharyngeal residue -  valleculae;Reduced anterior laryngeal mobility;Reduced laryngeal elevation Pharyngeal -- Pharyngeal- Nectar Straw -- Pharyngeal -- Pharyngeal- Thin Teaspoon -- Pharyngeal -- Pharyngeal- Thin Cup Pharyngeal residue - valleculae;Penetration/Apiration after swallow;Penetration/Aspiration during swallow;Reduced laryngeal elevation;Reduced anterior laryngeal mobility Pharyngeal Material enters airway, passes BELOW cords without attempt by patient to eject out (silent aspiration) Pharyngeal- Thin Straw Pharyngeal residue - valleculae Pharyngeal -- Pharyngeal- Puree -- Pharyngeal -- Pharyngeal- Mechanical Soft -- Pharyngeal -- Pharyngeal- Regular Pharyngeal residue - valleculae Pharyngeal -- Pharyngeal- Multi-consistency -- Pharyngeal -- Pharyngeal- Pill -- Pharyngeal -- Pharyngeal Comment --  CHL IP CERVICAL ESOPHAGEAL PHASE 05/25/2020 Cervical Esophageal Phase (No Data) Pudding Teaspoon -- Pudding Cup -- Honey Teaspoon -- Honey Cup -- Nectar Teaspoon -- Nectar Cup -- Nectar Straw -- Thin Teaspoon -- Thin Cup -- Thin Straw -- Puree -- Mechanical Soft -- Regular -- Multi-consistency -- Pill -- Cervical Esophageal Comment -- [name] 05/25/2020, 1:13 PM               Cardiac Studies   ECHO 03/23/2020  1. Left ventricular ejection fraction, by estimation, is 45 to 50%. The  left ventricle has normal function. The left ventricle demonstrates global  hypokinesis. There is mild left ventricular hypertrophy of the basal  segment. Left ventricular diastolic  function could not be evaluated.  2. Right ventricular systolic function is mildly reduced. The right  ventricular size is normal. There is normal pulmonary artery systolic  pressure.  3. The mitral valve is grossly normal. Mild mitral valve regurgitation.  4. The aortic valve is tricuspid. Aortic valve regurgitation is trivial.  Mild aortic valve sclerosis is present, with no evidence of aortic valve  stenosis.  5. The inferior vena cava is  normal in size with greater than 50%  respiratory variability, suggesting right atrial pressure of 3 mmHg.   Patient Profile     79 y.o. male with a history of persistent afib, etoh abuse,tob abuse,COPD,pulm fibrosis,and seizures and squamous call Ca of the larynx, developing recurrent episodes of RVR on background of well-rate controlled AFib  [place] will sign off.   Medication Recommendations:  Diltiazem CD 120mg  daily, Metoprolol 12.5 mg BID (hold for SBP<105, HR<65). Restart Eliquis 2.5 mg twice daily once invasive procedures are completed. Other recommendations (labs, testing, etc):  n/a Follow up as an outpatient:  Please notify Cardiology when approaching DC to allow [place] to arrange f/u.   For questions or updates, please contact [place] Please consult [email or web address] for contact info under        Signed, Sanda Klein, MD  05/27/2020, 8:43 AM

## 2020-05-27 NOTE — TOC Progression Note (Signed)
Transition of Care [facility]) - Progression Note    Patient Details  Name: [name] MRN: [id number] Date of Birth: 1941-03-17  Transition [facility]) CM/SW [place], [place] Phone Number: 05/27/2020, 10:16 AM  Clinical Narrative:     Pt had trach placed on 7/25. SNfs will not take a pt on an trach until they have had it for at least 30 days.   CSW reached out and left message for pts sister, [name].   Expected Discharge Plan: Assisted Living Barriers to Discharge: Continued Medical Work up  Expected Discharge Plan and Services Expected Discharge Plan: Assisted Living                                               Social Determinants of Health (SDOH) Interventions    Readmission Risk Interventions No flowsheet data found.  Emeterio Reeve, Latanya Presser, [place] Social Worker [phone]

## 2020-05-27 NOTE — H&P (Signed)
Chief Complaint: Epiglottic cancer  Referring Physician(s): Ghimire, Henreitta Leber, MD  Supervising Physician: Corrie Mckusick  Patient Status: [facility] - In-pt  History of Present Illness: [name] is a 79 y.o. male who presented to the ED on 05/21/20 with increased stridor and SOB.  He also reported recent choking on his food.   Dr. Janace Hoard performed a laryngoscopy and discovered a fungating mass in his R epiglottic fold and arytenoid  He underwent tracheostomy tube placement 05/23/20.  We are asked to perform an image guided gastrostomy tube placement.  He is currently NPO as he cannot swallow.   Eliquis (Afib) has been held.  Past Medical History:  Diagnosis Date  . Alcohol abuse   . Aortic atherosclerosis ([place])    a. 04/2020 noted on CT.  Marland Kitchen Cardiomyopathy ([place])    a. 12/2001 Echo: EF 55-65%; b. 02/2020 Echo: EF 45-50%, glob HK. Mild LVH of the basal segment.  Mildly reduced RV fxn. Nl RVSP. Mild MR. Triv AI.  Marland Kitchen COPD (chronic obstructive pulmonary disease) ([place])   . Persistent atrial fibrillation ([place])   . Seizures ([place])   . Squamous cell carcinoma - supraglottic mass    a. 04/2020 CT Head/neck: 3.3 x 3.7 x 3.0 cm R supraglottic mass consistent w/ squamous cell carcinoma. Bilat lymph nodes concerning for malignancy.    Past Surgical History:  Procedure Laterality Date  . DIRECT LARYNGOSCOPY N/A 05/23/2020   Procedure: DIRECT LARYNGOSCOPY with BIOPSY;  Surgeon: Melissa Montane, MD;  Location: [place];  Service: ENT;  Laterality: N/A;  . DIRECT LARYNGOSCOPY N/A 05/23/2020   Procedure: DIRECT LARYNGOSCOPY;  Surgeon: Melissa Montane, MD;  Location: [facility];  Service: ENT;  Laterality: N/A;  . TRACHEOSTOMY TUBE PLACEMENT N/A 05/23/2020   Procedure: TRACHEOSTOMY;  Surgeon: Melissa Montane, MD;  Location: [place];  Service: ENT;  Laterality: N/A;  . TRACHEOSTOMY TUBE PLACEMENT N/A 05/23/2020   Procedure: TRACHEOSTOMY;  Surgeon: Melissa Montane, MD;  Location[facility];  Service: ENT;  Laterality: N/A;      Allergies: Patient has no known allergies.  Medications: Prior to Admission medications   Medication Sig Start Date End Date Taking? Authorizing Provider  diltiazem (CARDIZEM CD) 120 MG 24 hr capsule Take 1 capsule (120 mg total) by mouth daily. Hold for HR less than 55 or BP less than 100/60 04/26/20  Yes Fenton, Clint R, PA  ELIQUIS 2.5 MG TABS tablet TAKE 1 TABLET BY MOUTH TWICE A DAY. 04/27/20  Yes Elsie Stain, MD  ipratropium-albuterol (DUONEB) 0.5-2.5 (3) MG/3ML SOLN Take 3 mLs by nebulization every 6 (six) hours as needed (sob/wheezing).  03/10/20  Yes [provider]  SYMBICORT 160-4.5 MCG/ACT inhaler INHALE 2 PUFFS INTO THE LUNGS IN THE MORNING AND AT BEDTIME 04/27/20  Yes Elsie Stain, MD  VENTOLIN HFA 108 (90 Base) MCG/ACT inhaler INHALE 2 PUFFS INTO THE LUNGS EVERY 6 HOURS AS NEEDED FOR WHEEZING OR SHORTNESS OF BREATH. Patient taking differently: Inhale 2 puffs into the lungs every 6 (six) hours as needed.  04/27/20  Yes Elsie Stain, MD     Family History  Problem Relation Age of Onset  . Hypertension Other     Social History   Socioeconomic History  . Marital status: Divorced    Spouse name: Not on file  . Number of children: Not on file  . Years of education: Not on file  . Highest education level: Not on file  Occupational History  . Not on file  Tobacco Use  . Smoking status: Former Research scientist (life sciences)  . Smokeless tobacco: Never Used  Substance and Sexual Activity  . Alcohol use: Not Currently    Alcohol/week: 6.0 standard drinks    Types: 6 Cans of beer per week  . Drug use: No  . Sexual activity: Not Currently  Other Topics Concern  . Not on file  Social History Narrative  . Not on file   Social Determinants of Health   Financial Resource Strain:   . Difficulty of Paying Living Expenses:   Food Insecurity:   . Worried About Charity fundraiser in the Last Year:   . Arboriculturist in the Last Year:   Transportation Needs:   . Lexicographer (Medical):   Marland Kitchen Lack of Transportation (Non-Medical):   Physical Activity:   . Days of Exercise per Week:   . Minutes of Exercise per Session:   Stress:   . Feeling of Stress :   Social Connections:   . Frequency of Communication with Friends and Family:   . Frequency of Social Gatherings with Friends and Family:   . Attends Religious Services:   . Active Member of Clubs or Organizations:   . Attends Archivist Meetings:   Marland Kitchen Marital Status:      Review of Systems: A 12 point ROS discussed and pertinent positives are indicated in the HPI above.  All other systems are negative.  Review of Systems  Vital Signs: BP (!) 79/61 (BP Location: Left Arm)   Pulse 75   Temp 98.7 F (37.1 C) (Oral)   Resp 18   Ht 5\' 5"  (1.651 m)   Wt 45.8 kg   SpO2 97%   BMI 16.80 kg/m   Physical Exam Vitals reviewed.  Constitutional:      Appearance: Normal appearance.  HENT:     Head: Normocephalic and atraumatic.  Eyes:     Extraocular Movements: Extraocular movements intact.  Neck:     Comments: Tracheostomy in place Cardiovascular:     Rate and Rhythm: Normal rate and regular rhythm.  Pulmonary:     Effort: Pulmonary effort is normal. No respiratory distress.     Breath sounds: Normal breath sounds.  Abdominal:     General: There is no distension.     Palpations: Abdomen is soft.     Tenderness: There is no abdominal tenderness.  Musculoskeletal:        General: Normal range of motion.     Cervical back: Normal range of motion.  Skin:    General: Skin is warm and dry.  Neurological:     General: No focal deficit present.     Mental Status: He is alert and oriented to person, place, and time.  Psychiatric:        Mood and Affect: Mood normal.        Behavior: Behavior normal.        Thought Content: Thought content normal.        Judgment: Judgment normal.     Imaging: CT Soft Tissue Neck W Contrast  Result Date: 05/21/2020 CLINICAL DATA:  Right  laryngeal mass.  Stridor. EXAM: CT NECK WITH CONTRAST TECHNIQUE: Multidetector CT imaging of the neck was performed using the standard protocol following the bolus administration of intravenous contrast. CONTRAST:  54mL OMNIPAQUE IOHEXOL 300 MG/ML  SOLN COMPARISON:  CT chest 07/10/2016 and 03/25/2020 FINDINGS: Pharynx and larynx: Nasopharynx is clear. Soft palate is within normal limits. Tongue base and  palatine tonsils are normal. The epiglottis is within normal limits. Soft tissue tumor fills the right area epiglottic fold. Tumor measures 3.3 x 3.7 x 3.0 cm. Tumor crosses midline posteriorly. Tumor extends at least 5 mm below the right vocal cord. Lower trachea is within normal limits. Salivary glands: Submandibular and parotid glands and ducts are within normal limits. Thyroid: Normal Lymph nodes: Right level 2 rounded nodes measuring 7 and 8 mm raise some concern for malignancy. Rounded posterior left level 3 lymph nodes are present. Vascular: Atherosclerotic changes are present at the carotid bifurcations bilaterally without significant stenosis. Additional calcifications are present at the origin of the left subclavian artery without significant stenosis. Limited intracranial: Negative. Visualized orbits: The globes and orbits are within normal limits. Mastoids and visualized paranasal sinuses: The paranasal sinuses and mastoid air cells are clear. Skeleton: Multilevel degenerative changes the cervical spine are most severe at C6-7 and C7-T1. 2 body heights are maintained. The patient is edentulous. Upper chest: Centrilobular emphysematous changes are present. Lung apices are otherwise clear. Thoracic inlet is within normal limits. IMPRESSION: 1. 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 Tumor crosses midline posteriorly and extends at least 5 mm below the right vocal cord. 2. Rounded right level 2 and posterior left level 3 lymph nodes are concerning for  malignancy. PET scan of the neck would be useful to evaluate for any malignant nodes. 3. Aortic Atherosclerosis (ICD10-I70.0) and Emphysema (ICD10-J43.9). Electronically Signed   By: San Morelle M.D.   On: 05/21/2020 22:00   DG Chest Port 1 View  Result Date: 05/24/2020 CLINICAL DATA:  Leukocytosis EXAM: PORTABLE CHEST 1 VIEW COMPARISON:  May 21, 2020 chest radiograph and chest CT Mar 25, 2020 FINDINGS: Tracheostomy catheter tip is 5.8 cm above the carina. No pneumothorax evident. There is, however, pneumomediastinum as well as subcutaneous air tracking in the supraclavicular regions bilaterally. Fibrotic type changes noted bilaterally, primarily in the mid and lower lung regions. There is no frank edema or airspace opacity. Heart is upper normal in size with pulmonary vascularity normal. No adenopathy. No bone lesions. IMPRESSION: Pneumomediastinum and supraclavicular soft tissue air of uncertain etiology. No pneumothorax evident. No tracheostomy present. Question pneumomediastinum and subcutaneous air secondary to recent tracheostomy placement. Areas of fibrotic change bilaterally without frank edema or airspace opacity. Stable cardiac silhouette. Electronically Signed   By: Lowella Grip III M.D.   On: 05/24/2020 05:24   DG Chest Port 1 View  Result Date: 05/21/2020 CLINICAL DATA:  Difficulty breathing, COPD, CHF EXAM: PORTABLE CHEST 1 VIEW COMPARISON:  03/04/2020, 03/25/2020 FINDINGS: Single frontal view of the chest demonstrates a stable cardiac silhouette. There are chronic bilateral areas of scarring, with fibrosis greatest at the lung bases left greater than right. No acute airspace disease. No effusion or pneumothorax. No acute bony abnormalities. IMPRESSION: 1. 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 No acute intrathoracic process. Electronically Signed   By: Randa Ngo M.D.   On: 05/21/2020 19:21   DG Swallowing Func-Speech  Pathology  Result Date: 05/25/2020 Objective Swallowing Evaluation: Type of Study: MBS-Modified Barium Swallow Study  Patient Details Name: [name] MRN: [id number] Date of Birth: 14-Nov-1940 Today's Date: 05/25/2020 Time: SLP Start Time (ACUTE ONLY): 1045 -SLP Stop Time (ACUTE ONLY): 1103 SLP Time Calculation (min) (ACUTE ONLY): 18 min Past Medical History: Past Medical History: Diagnosis Date . Alcohol abuse  . Aortic atherosclerosis ([place])   a. 04/2020 noted on CT. Marland Kitchen  Cardiomyopathy ([place])   a. 12/2001 Echo: EF 55-65%; b. 02/2020 Echo: EF 45-50%, glob HK. Mild LVH of the basal segment.  Mildly reduced RV fxn. Nl RVSP. Mild MR. Triv AI. Marland Kitchen COPD (chronic obstructive pulmonary disease) ([place])  . Persistent atrial fibrillation ([place])  . Seizures ([place])  . Squamous cell carcinoma - supraglottic mass   a. 04/2020 CT Head/neck: 3.3 x 3.7 x 3.0 cm R supraglottic mass consistent w/ squamous cell carcinoma. Bilat lymph nodes concerning for malignancy. Past Surgical History: Past Surgical History: Procedure Laterality Date . DIRECT LARYNGOSCOPY N/A 05/23/2020  Procedure: DIRECT LARYNGOSCOPY with BIOPSY;  Surgeon: Melissa Montane, MD;  Location: [place];  Service: ENT;  Laterality: N/A; . TRACHEOSTOMY TUBE PLACEMENT N/A 05/23/2020  Procedure: TRACHEOSTOMY;  Surgeon: Melissa Montane, MD;  Location: [place];  Service: ENT;  Laterality: N/A; HPI:  Patient is a 79 y.o. 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 He has a trach and ENT advised againg Passy-Muir speaking valve presently. His laryngeal elevation and anterior  hyoid excurison is decreased and mass appears to obstruct laryngeal vestibule and in turn appears to prevent greater penetration/aspiration. There was mild-moderate vallecular residue consistently. Minimal amount of barium, seen close to and below vocal folds, possibly entered after the initial swallow. Pt began to have tachycardia, which RN was aware of. Towards end of study SAWT recommended pt go back to room and no strategies were attempted. Recommend regular texture and thin liquids, crushed meds. Therapist was made aware of pt's disposition plans to hospice today.        SLP Visit Diagnosis Dysphagia, pharyngeal phase (R13.13) Attention and concentration deficit following -- Frontal lobe and executive function deficit following -- Impact on safety and function Mild aspiration risk;Moderate aspiration risk   CHL IP TREATMENT RECOMMENDATION 05/25/2020 Treatment Recommendations Other (Comment)   Prognosis 07/12/2016 Prognosis for Safe Diet Advancement Fair Barriers to Reach Goals Cognitive deficits Barriers/Prognosis Comment -- CHL IP DIET RECOMMENDATION 05/25/2020 SLP Diet Recommendations Regular solids;Thin liquid Liquid Administration via Cup;Straw Medication Administration Crushed with puree Compensations Slow rate;Small sips/bites Postural Changes Seated upright at 90 degrees   CHL IP OTHER RECOMMENDATIONS 05/25/2020 Recommended Consults -- Oral Care Recommendations Oral care BID Other Recommendations --   CHL IP FOLLOW UP RECOMMENDATIONS 05/25/2020 Follow up Recommendations None   CHL IP FREQUENCY AND DURATION 07/12/2016 Speech Therapy Frequency (ACUTE ONLY) min 2x/week Treatment Duration 1 week      CHL IP ORAL PHASE 05/25/2020 Oral Phase WFL Oral - Pudding Teaspoon -- Oral - Pudding Cup -- Oral - Honey Teaspoon -- Oral - Honey Cup -- Oral - Nectar Teaspoon -- Oral - Nectar Cup -- Oral - Nectar Straw -- Oral - Thin Teaspoon -- Oral - Thin Cup -- Oral - Thin Straw -- Oral - Puree -- Oral - Mech Soft -- Oral -  Regular -- Oral - Multi-Consistency -- Oral - Pill -- Oral Phase - Comment --  CHL IP PHARYNGEAL PHASE 05/25/2020 Pharyngeal Phase Impaired Pharyngeal- Pudding Teaspoon -- Pharyngeal -- Pharyngeal- Pudding Cup -- Pharyngeal -- Pharyngeal- Honey Teaspoon -- Pharyngeal -- Pharyngeal- Honey Cup -- Pharyngeal -- Pharyngeal- Nectar Teaspoon Delayed swallow initiation-vallecula;Pharyngeal residue - valleculae;Reduced laryngeal  elevation;Reduced anterior laryngeal mobility Pharyngeal -- Pharyngeal- Nectar Cup Pharyngeal residue - valleculae;Reduced anterior laryngeal mobility;Reduced laryngeal elevation Pharyngeal -- Pharyngeal- Nectar Straw -- Pharyngeal -- Pharyngeal- Thin Teaspoon -- Pharyngeal -- Pharyngeal- Thin Cup Pharyngeal residue - valleculae;Penetration/Apiration after swallow;Penetration/Aspiration during swallow;Reduced laryngeal elevation;Reduced anterior laryngeal mobility Pharyngeal Material enters airway, passes BELOW cords without attempt by patient to eject out (silent aspiration) Pharyngeal- Thin Straw Pharyngeal residue - valleculae Pharyngeal -- Pharyngeal- Puree -- Pharyngeal -- Pharyngeal- Mechanical Soft -- Pharyngeal -- Pharyngeal- Regular Pharyngeal residue - valleculae Pharyngeal -- Pharyngeal- Multi-consistency -- Pharyngeal -- Pharyngeal- Pill -- Pharyngeal -- Pharyngeal Comment --  CHL IP CERVICAL ESOPHAGEAL PHASE 05/25/2020 Cervical Esophageal Phase (No Data) Pudding Teaspoon -- Pudding Cup -- Honey Teaspoon -- Honey Cup -- Nectar Teaspoon -- Nectar Cup -- Nectar Straw -- Thin Teaspoon -- Thin Cup -- Thin Straw -- Puree -- Mechanical Soft -- Regular -- Multi-consistency -- Pill -- Cervical Esophageal Comment -- [name] 05/25/2020, 1:13 PM               Labs:  CBC: Recent Labs    05/24/20 0119 05/25/20 0316 05/26/20 0221 05/27/20 0235  WBC 16.9* 16.5* 23.3* 26.6*  HGB 11.6* 12.1* 12.5* 11.1*  HCT 33.2* 33.3* 35.1* 31.5*  PLT 173 259 182 174    COAGS: No results  for input(s): INR, APTT in the last 8760 hours.  BMP: Recent Labs    05/24/20 0119 05/25/20 0316 05/26/20 0221 05/27/20 0235  NA 140 139 138 136  K 3.7 3.9 3.8 3.7  CL 110 107 105 103  CO2 25 24 24 23   GLUCOSE 138* 138* 137* 173*  BUN 16 15 17 18   CALCIUM 8.6* 8.5* 8.5* 8.3*  CREATININE 0.83 0.91 0.75 0.82  GFRNONAA >60 >60 >60 >60  GFRAA >60 >60 >60 >60    LIVER FUNCTION TESTS: Recent Labs    05/24/20 0119 05/25/20 0316 05/26/20 0221 05/27/20 0235  BILITOT 0.8 0.9 1.2 0.9  AST 14* 14* 13* 13*  ALT 11 11 11 11   ALKPHOS 44 45 49 44  PROT 5.6* 5.7* 5.9* 5.5*  ALBUMIN 2.7* 2.7* 2.9* 2.5*    TUMOR MARKERS: No results for input(s): AFPTM, CEA, CA199, CHROMGRNA in the last 8760 hours.  Assessment and Plan:  Fungating mass in his R epiglottic fold and arytenoid = Squamous cell carcinoma.  Will proceed with image guided placement of a gastrostomy tube today by Dr. Earleen Newport = may need to be a push through if unable to get past the tumor.  Risks and benefits image guided gastrostomy tube placement was discussed with the patient including, but not limited to the need for a barium enema during the procedure, bleeding, infection, peritonitis and/or damage to adjacent structures.  All of the patient's questions were answered, patient is agreeable to proceed.  Consent signed and in chart.  Thank you for this interesting consult.  I greatly enjoyed meeting [name] and look forward to participating in their care.  A copy of this report was sent to the requesting provider on this date.  Electronically Signed: Murrell Redden, PA-C   05/27/2020, 9:35 AM      I spent a total of 40 Minutes in face to face in clinical consultation, greater than 50% of which was counseling/coordinating care for gastrostomy tube placement.

## 2020-05-27 NOTE — Progress Notes (Signed)
Daily Progress Note   Patient Name: [name]       Date: 05/27/2020 DOB: 06/08/1941  Age: 79 y.o. MRN#: [id number] Attending Physician: Jonetta Osgood, MD Primary Care Physician: Elsie Stain, MD Admit Date: 05/21/2020  Reason for Consultation/Follow-up: Disposition, Establishing goals of care, Non pain symptom management, Pain control and Psychosocial/spiritual support  Subjective: Chart review performed. Received report from primary RN. Her most acute concern is regarding the patient's fluctuating HR - cardiology is managing.  Went to visit patient at bedside - no family present. Patient was sitting in chair eating breakfast. He no longer is requiring mitts for safety and seems more awake and oriented today. His communication remains limited. He still is only able to answer yes/no questions due to trach.   Patient denied pain - shook his head no when asked. No non-verbal signs or gestures of discomfort noted. Today, he nodded yes when asked if he knew where he was. Patient also nodded yes when asked if he felt better today than previous days. He was able to gesture yes when asked if he was going to receive treatment for his cancer and receive PEG tube. Updated him on plan for transfer to [facility] when bed becomes available.   Called sister/[name] and provided update - all questions answered.   Plan is for PEG placement and transfer to [facility] for evaluation with radiation oncology.   Length of Stay: 6  Current Medications: Scheduled Meds:  . (feeding supplement) PROSource Plus  30 mL Oral BID BM  . chlorhexidine  15 mL Mouth Rinse BID  . Chlorhexidine Gluconate Cloth  6 each Topical Daily  . diltiazem  120 mg Oral Daily  . feeding supplement (ENSURE ENLIVE)  237 mL Oral TID BM  .  mouth rinse  15 mL Mouth Rinse q12n4p  . methylPREDNISolone (SOLU-MEDROL) injection  20 mg Intravenous Q12H  . metoprolol tartrate  12.5 mg Oral BID  . mometasone-formoterol  2 puff Inhalation BID  . multivitamin with minerals  1 tablet Oral Daily    Continuous Infusions: . sodium chloride Stopped (05/24/20 1450)  . dextrose 5 % and 0.9% NaCl 20 mL/hr at 05/27/20 0604    PRN Meds: sodium chloride, acetaminophen **OR** acetaminophen, ipratropium-albuterol, metoprolol tartrate, ondansetron **OR** ondansetron (ZOFRAN) IV  Physical Exam Constitutional:  General: He is not in acute distress.    Appearance: He is ill-appearing.  Pulmonary:     Effort: No respiratory distress.     Comments: tracheostomy in present Skin:    General: Skin is warm and dry.  Neurological:     Mental Status: He is alert.     Motor: Weakness present.  Psychiatric:        Behavior: Behavior is cooperative.        Thought Content: Thought content normal.             Vital Signs: BP (!) 79/61 (BP Location: Left Arm)   Pulse 75   Temp 98.7 F (37.1 C) (Oral)   Resp 18   Ht 5\' 5"  (1.651 m)   Wt 45.8 kg   SpO2 97%   BMI 16.80 kg/m  SpO2: SpO2: 97 % O2 Device: O2 Device: Tracheostomy Collar O2 Flow Rate: O2 Flow Rate (L/min): 5 L/min  Intake/output summary:   Intake/Output Summary (Last 24 hours) at 05/27/2020 0950 Last data filed at 05/27/2020 0410 Gross per 24 hour  Intake 480 ml  Output 900 ml  Net -420 ml   LBM: Last BM Date:  (PTA) Baseline Weight: Weight: 50 kg Most recent weight: Weight: 45.8 kg       Palliative Assessment/Data: PPS 50%      Patient Active Problem List   Diagnosis Date Noted  . 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 Dysphagia   . Palliative care by specialist   . Goals of care, counseling/discussion   . DNR (do not resuscitate) discussion   . Supraglottic mass 05/22/2020  . SOB (shortness of breath) 05/21/2020  . Stridor 05/21/2020  . Benign tumor of epiglottis  (suprahyoid portion) 05/21/2020  . 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 Weight loss 03/18/2020  . Atrial fibrillation ([place]) 03/10/2020  . Secondary hypercoagulable state ([place]) 03/10/2020    Palliative Care Assessment & Plan   Patient Profile: 79 y.o. 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  05/23/20 tracheostomy placed  Assessment: Stridor Supraglottic tumor Atrial fibrillation with RVR Dysphagia History of idiopathic pulmonary fibrosis Anemia Weakness Inadequate oral intake  Recommendations/Plan:  Continue current medical treatment  Continue DNR/DNI  Plan is for PEG tube placement and transfer to [facility] for radiation oncology evaluation  Please keep sister/[name] updated on patient's hospital course/plan of care - she is clear in her desire to stay updated with information and is open to input from all disciplines   PMT will continue to follow peripherally. If there are any imminent needs please call the service directly  Goals of Care and Additional Recommendations:  Limitations on Scope of Treatment: Full Scope Treatment  Code Status:    Code Status Orders  (From admission, onward)         Start     Ordered   05/25/20 1037  Do not attempt resuscitation (DNR)  Continuous       Question Answer Comment  In the event of cardiac or respiratory ARREST Do not call a "code blue"   In the event of cardiac or respiratory ARREST Do not perform Intubation, CPR, defibrillation or ACLS   In the event of cardiac or respiratory ARREST Use medication by any route, position, wound care, and other measures to relive pain and suffering. May use oxygen, suction and manual  treatment of airway obstruction as needed for comfort.      05/25/20 1037        Code Status History    Date Active Date  Inactive Code Status Order ID Comments User Context   05/22/2020 0430 05/25/2020 1037 Full Code [id number]  Elwyn Reach, MD ED   07/10/2016 0226 07/10/2016 1005 Full Code [id number]  Rise Patience, MD Inpatient   Advance Care Planning Activity       Prognosis:   Unable to determine  Discharge Planning:  To Be Determined  Care plan was discussed with primary RN, Dr. Sloan Leiter, Dr. Marcelline Deist, patient, sister  Thank you for allowing the Palliative Medicine Team to assist in the care of this patient.   Total Time 25 minutes Prolonged Time Billed  no       Greater than 50%  of this time was spent counseling and coordinating care related to the above assessment and plan.  Lin Landsman, NP  Please contact Palliative Medicine Team phone at [phone] for questions and concerns.

## 2020-05-27 NOTE — Progress Notes (Signed)
PROGRESS NOTE        PATIENT DETAILS Name: [name] Age: 79 y.o. Sex: male Date of Birth: 1941/01/01 Admit Date: 05/21/2020 Admitting Physician Bonnielee Haff, MD XNA:[id number], Burnett Harry, MD  Brief Narrative: Patient is a 79 y.o. 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  Postoperatively-had bleeding around the tracheostomy site.  See below for further details.  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  Assessment/Plan: Shortness of breath with stridor secondary to supraglottic tumor: S/p tracheostomy with laryngeal biopsy on 2/02-[id number] course complicated by bleeding that has now resolved.  Biopsy showing squamous cell carcinoma.  ENT following-oncology/radiation oncology following.  After discussion with radiation oncology-Dr. Racheal Patches are to transfer the patient to [facility] long hospital for simulation/radiation treatment.  Oncology has ordered a CT chest for staging.  Leukocytosis: Likely secondary to steroids-afebrile-no other indication of infection-watch closely as he is at risk for aspiration.  Watch closely now that steroids have been discontinued.  Persistent A. fib with RVR: Rate controlled this  morning-remains on metoprolol and Cardizem-as discussed with ENT-Dr. Marcelline Deist on 7/27-no anticoagulation for 1 week from 7/25-if no further bleeding episodes-okay to resume anticoagulation.  Continue rate control with Cardizem-cardiology following- appreciate input.    Dysphagia: Secondary to laryngeal mass-spoke with SLP-recommendations are for a dysphagia 3 diet-in anticipation of radiation and due to ongoing poor oral intake-PEG tube placement ordered.  IR following with plans to place PEG tube later today.   HTN: BP stable-resume oral antihypertensives when able.  History of idiopathic pulmonary fibrosis: Appears stable-followed by Dr. Joya Gaskins in the outpatient setting-etiology thought to be recurrent aspiration.  Normocytic anemia: Mild-stable for follow-up.  Debility/deconditioning: Persistent patient's history-has been gradually declining in function over the past few months-now at ALF-we will obtain PT/OT eval.  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  Family Communication: Sister over [phone]  on 7/28  Disposition Plan: Status is: Inpatient  Remains inpatient appropriate because:Inpatient level of care appropriate due to severity of illness  Dispo:  Patient From: ALF  Planned Disposition: To be determined  Expected discharge date: 05/25/20  Medically stable for discharge: No   Barriers to Discharge: Stridor due to laryngeal mass-s/p tracheotomy-postoperative course complicated by bleeding-needing PEG tube placement-and initiation of radiation treatment.  Antimicrobial agents: Anti-infectives (From admission, onward)   None       Time spent: 25 minutes-Greater than 50% of this time was spent in counseling, explanation of diagnosis, planning of further management, and coordination of care.  MEDICATIONS: Scheduled Meds: . (feeding supplement) PROSource Plus  30 mL Oral BID BM  . chlorhexidine  15 mL Mouth Rinse BID  . Chlorhexidine Gluconate Cloth  6 each Topical Daily  . diltiazem  120 mg Oral Daily  . feeding supplement (ENSURE ENLIVE)  237 mL Oral TID BM  . mouth rinse  15 mL Mouth Rinse q12n4p  . methylPREDNISolone (SOLU-MEDROL) injection  20 mg Intravenous Q12H  . metoprolol tartrate  12.5 mg Oral BID  . mometasone-formoterol  2 puff Inhalation BID  . multivitamin with minerals  1 tablet Oral Daily   Continuous Infusions: . sodium chloride Stopped (05/24/20 1450)  . dextrose 5 % and 0.9% NaCl 20 mL/hr at 05/27/20 0604   PRN Meds:.sodium chloride, acetaminophen **OR** acetaminophen, ipratropium-albuterol, metoprolol tartrate, ondansetron **OR** ondansetron (ZOFRAN) IV   PHYSICAL EXAM: Vital signs: Vitals:   05/27/20 0725 05/27/20 0726 05/27/20 1003 05/27/20 1142  BP: (!) 79/61  112/79   Pulse: 63 75 88 98  Resp: 14 18 20 20   Temp: 98.7 F (37.1 C)     TempSrc: Oral     SpO2: 97% 97% 96% 93%  Weight:      Height:       Filed Weights   05/21/20 1900 05/23/20  2126 05/24/20 2101  Weight: 50 kg 46.6 kg 45.8 kg   Body mass index is 16.8 kg/m.   Gen Exam:Alert awake-not in any distress HEENT:atraumatic, normocephalic Chest: B/L clear to auscultation anteriorly CVS:S1S2 regular Abdomen:soft non tender, non distended Extremities:no edema Neurology: Non focal Skin: no rash  I have personally reviewed following labs and imaging studies  LABORATORY DATA: CBC: Recent Labs  Lab 05/21/20 1858 05/21/20 1858 05/22/20 0431 05/24/20 0119 05/25/20 0316 05/26/20 0221 05/27/20 0235  WBC 7.5   < > 5.4 16.9* 16.5* 23.3* 26.6*  NEUTROABS 5.3  --   --   --   --   --   --   HGB 11.9*   < > 12.2* 11.6* 12.1* 12.5* 11.1*  HCT 33.5*   < > 34.6* 33.2* 33.3* 35.1* 31.5*  MCV 82.9   < > 82.4 80.2 80.2 79.6* 79.9*  PLT 173   < > 164 173 259 182 174   < > = values in this interval not displayed.    Basic Metabolic Panel: Recent Labs  Lab 05/22/20 0431 05/24/20 0119 05/25/20 0316 05/26/20 0221 05/27/20 0235  NA 138 140 139 138 136  K 4.0 3.7 3.9 3.8 3.7  CL 103 110 107 105 103  CO2 25 25 24 24 23   GLUCOSE 147* 138* 138* 137* 173*  BUN 16 16 15 17 18   CREATININE 0.83 0.83 0.91 0.75 0.82  CALCIUM 9.1 8.6* 8.5* 8.5* 8.3*  MG  --  2.0 1.9  --   --     GFR: Estimated Creatinine Clearance: 46.5 mL/min (by C-G  formula based on SCr of 0.82 mg/dL).  Liver Function Tests: Recent Labs  Lab 05/22/20 0431 05/24/20 0119 05/25/20 0316 05/26/20 0221 05/27/20 0235  AST 14* 14* 14* 13* 13*  ALT 10 11 11 11 11   ALKPHOS 55 44 45 49 44  BILITOT 1.0 0.8 0.9 1.2 0.9  PROT 7.2 5.6* 5.7* 5.9* 5.5*  ALBUMIN 3.9 2.7* 2.7* 2.9* 2.5*   No results for input(s): LIPASE, AMYLASE in the last 168 hours. No results for input(s): AMMONIA in the last 168 hours.  Coagulation Profile: No results for input(s): INR, PROTIME in the last 168 hours.  Cardiac Enzymes: No results for input(s): CKTOTAL, CKMB, CKMBINDEX, TROPONINI in the last 168 hours.  BNP (last 3  results) No results for input(s): PROBNP in the last 8760 hours.  Lipid Profile: No results for input(s): CHOL, HDL, LDLCALC, TRIG, CHOLHDL, LDLDIRECT in the last 72 hours.  Thyroid Function Tests: No results for input(s): TSH, T4TOTAL, FREET4, T3FREE, THYROIDAB in the last 72 hours.  Anemia Panel: No results for input(s): VITAMINB12, FOLATE, FERRITIN, TIBC, IRON, RETICCTPCT in the last 72 hours.  Urine analysis:    Component Value Date/Time   COLORURINE YELLOW 07/14/2015 1352   APPEARANCEUR CLEAR 07/14/2015 1352   LABSPEC 1.015 07/14/2015 1352   PHURINE 7.5 07/14/2015 1352   GLUCOSEU NEGATIVE 07/14/2015 1352   HGBUR NEGATIVE 07/14/2015 1352   BILIRUBINUR NEGATIVE 07/14/2015 1352   KETONESUR NEGATIVE 07/14/2015 1352   PROTEINUR NEGATIVE 07/14/2015 1352   UROBILINOGEN 2.0 (H) 07/14/2015 1352   NITRITE NEGATIVE 07/14/2015 1352   LEUKOCYTESUR NEGATIVE 07/14/2015 1352    Sepsis Labs: Lactic Acid, Venous No results found for: LATICACIDVEN  MICROBIOLOGY: Recent Results (from the past 240 hour(s))  SARS Coronavirus 2 by RT PCR (hospital order, performed in [facility] hospital lab) Nasopharyngeal Nasopharyngeal Swab     Status: None   Collection Time: 05/21/20  7:19 PM   Specimen: Nasopharyngeal Swab  Result Value Ref Range Status   SARS Coronavirus 2 NEGATIVE NEGATIVE Final    Comment: (NOTE) SARS-CoV-2 target nucleic acids are NOT DETECTED.  The SARS-CoV-2 RNA is generally detectable in upper and lower respiratory specimens during the acute phase of infection. The lowest concentration of SARS-CoV-2 viral copies this assay can detect is 250 copies / mL. A negative result does not preclude SARS-CoV-2 infection and should not be used as the sole basis for treatment or other patient management decisions.  A negative result may occur with improper specimen collection / handling, submission of specimen other than nasopharyngeal swab, presence of viral mutation(s) within  the areas targeted by this assay, and inadequate number of viral copies (<250 copies / mL). A negative result must be combined with clinical observations, patient history, and epidemiological information.  Fact Sheet for Patients:   StrictlyIdeas.no  Fact Sheet for Healthcare Providers: BankingDealers.co.za  This test is not yet approved or  cleared by the [place] FDA and has been authorized for detection and/or diagnosis of SARS-CoV-2 by FDA under an Emergency Use Authorization (EUA).  This EUA will remain in effect (meaning this test can be used) for the duration of the COVID-19 declaration under Section 564(b)(1) of the Act, 21 U.S.C. section 360bbb-3(b)(1), unless the authorization is terminated or revoked sooner.  Performed at [facility], [place] [address], [place], [place] [address]   MRSA PCR Screening     Status: None   Collection Time: 05/22/20  8:54 PM   Specimen: Nasal Mucosa; Nasopharyngeal  Result Value Ref  Range Status   MRSA by PCR NEGATIVE NEGATIVE Final    Comment:        The GeneXpert MRSA Assay (FDA approved for NASAL specimens only), is one component of a comprehensive MRSA colonization surveillance program. It is not intended to diagnose MRSA infection nor to guide or monitor treatment for MRSA infections. Performed at [facility], [place] [address], [place], [place] [address]     RADIOLOGY STUDIES/RESULTS: No results found.   LOS: 6 days   Oren Binet, MD  [facility]    To contact the attending provider between 7A-7P or the covering provider during after hours 7P-7A, please log into the web site [email or web address] and access using universal [place] password for that web site. If you do not have the password, please call the hospital operator.  05/27/2020, 1:48 PM

## 2020-05-27 NOTE — Progress Notes (Signed)
Called for pt to come to IR for gtube procedure. RN states that pt had breakfast this morning at 830-9am. Advised this will delay procedure and I will return call if IR is able to accommodate procedure this afternoon.

## 2020-05-28 ENCOUNTER — Inpatient Hospital Stay (HOSPITAL_COMMUNITY): Payer: Medicare Other

## 2020-05-28 DIAGNOSIS — J387 Other diseases of larynx: Secondary | ICD-10-CM | POA: Diagnosis not present

## 2020-05-28 DIAGNOSIS — D649 Anemia, unspecified: Secondary | ICD-10-CM | POA: Diagnosis not present

## 2020-05-28 DIAGNOSIS — I4821 Permanent atrial fibrillation: Secondary | ICD-10-CM | POA: Diagnosis not present

## 2020-05-28 HISTORY — PX: IR GASTROSTOMY TUBE MOD SED: IMG625

## 2020-05-28 LAB — COMPREHENSIVE METABOLIC PANEL
ALT: 15 U/L (ref 0–44)
AST: 18 U/L (ref 15–41)
Albumin: 2.4 g/dL — ABNORMAL LOW (ref 3.5–5.0)
Alkaline Phosphatase: 49 U/L (ref 38–126)
Anion gap: 8 (ref 5–15)
BUN: 14 mg/dL (ref 8–23)
CO2: 26 mmol/L (ref 22–32)
Calcium: 8.4 mg/dL — ABNORMAL LOW (ref 8.9–10.3)
Chloride: 104 mmol/L (ref 98–111)
Creatinine, Ser: 0.69 mg/dL (ref 0.61–1.24)
GFR calc Af Amer: >60 mL/min (ref >60)
GFR calc non Af Amer: >60 mL/min (ref >60)
Glucose, Bld: 102 mg/dL — ABNORMAL HIGH (ref 70–99)
Potassium: 3.7 mmol/L (ref 3.5–5.1)
Sodium: 138 mmol/L (ref 135–145)
Total Bilirubin: 1.1 mg/dL (ref 0.3–1.2)
Total Protein: 5.4 g/dL — ABNORMAL LOW (ref 6.5–8.1)

## 2020-05-28 LAB — CBC
HCT: 32.5 % — ABNORMAL LOW (ref 39.0–52.0)
Hemoglobin: 11.7 g/dL — ABNORMAL LOW (ref 13.0–17.0)
MCH: 29.1 pg (ref 26.0–34.0)
MCHC: 36 g/dL (ref 30.0–36.0)
MCV: 80.8 fL (ref 80.0–100.0)
Platelets: 183 10*3/uL (ref 150–400)
RBC: 4.02 MIL/uL — ABNORMAL LOW (ref 4.22–5.81)
RDW: 13.5 % (ref 11.5–15.5)
WBC: 19 10*3/uL — ABNORMAL HIGH (ref 4.0–10.5)
nRBC: 0 % (ref 0.0–0.2)

## 2020-05-28 MED ORDER — IOHEXOL 300 MG/ML  SOLN
50.0000 mL | Freq: Once | INTRAMUSCULAR | Status: AC | PRN
  Administered 2020-05-28: 10 mL

## 2020-05-28 MED ORDER — LIDOCAINE HCL 1 % IJ SOLN
INTRAMUSCULAR | Status: AC | PRN
  Administered 2020-05-28: 5 mL

## 2020-05-28 MED ORDER — MIDAZOLAM HCL 2 MG/2ML IJ SOLN
INTRAMUSCULAR | Status: AC | PRN
  Administered 2020-05-28: 0.5 mg via INTRAVENOUS

## 2020-05-28 MED ORDER — GLUCAGON HCL (RDNA) 1 MG IJ SOLR
INTRAMUSCULAR | Status: AC | PRN
  Administered 2020-05-28: 1 mg via INTRAVENOUS

## 2020-05-28 MED ORDER — FENTANYL CITRATE (PF) 100 MCG/2ML IJ SOLN
INTRAMUSCULAR | Status: AC
  Filled 2020-05-28: qty 2

## 2020-05-28 MED ORDER — FENTANYL CITRATE (PF) 100 MCG/2ML IJ SOLN
INTRAMUSCULAR | Status: AC | PRN
  Administered 2020-05-28 (×2): 12.5 ug via INTRAVENOUS

## 2020-05-28 MED ORDER — METOPROLOL TARTRATE 5 MG/5ML IV SOLN
5.0000 mg | Freq: Four times a day (QID) | INTRAVENOUS | Status: DC
  Administered 2020-05-29 – 2020-05-30 (×6): 5 mg via INTRAVENOUS
  Filled 2020-05-28 (×6): qty 5

## 2020-05-28 MED ORDER — MIDAZOLAM HCL 2 MG/2ML IJ SOLN
INTRAMUSCULAR | Status: AC
  Filled 2020-05-28: qty 2

## 2020-05-28 MED ORDER — CEFAZOLIN SODIUM-DEXTROSE 2-4 GM/100ML-% IV SOLN
INTRAVENOUS | Status: AC | PRN
  Administered 2020-05-28: 2 g via INTRAVENOUS

## 2020-05-28 MED ORDER — LIDOCAINE HCL 1 % IJ SOLN
INTRAMUSCULAR | Status: AC
  Filled 2020-05-28: qty 20

## 2020-05-28 NOTE — Progress Notes (Signed)
PROGRESS NOTE        PATIENT DETAILS Name: [name] Age: 79 y.o. Sex: male Date of Birth: 1941/05/08 Admit Date: 05/21/2020 Admitting Physician Bonnielee Haff, MD MWN:[id number], Burnett Harry, MD  Brief Narrative: Patient is a 79 y.o. 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  Postoperatively-had bleeding around the tracheostomy site.  See below for further details.  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  Assessment/Plan: Shortness of breath with stridor secondary to supraglottic tumor: S/p tracheostomy with laryngeal biopsy on 6/64-[id number] course complicated by bleeding that has now resolved.  Biopsy showing squamous cell carcinoma.  ENT following-oncology/radiation oncology following.  After discussion with radiation oncology-Dr. Racheal Patches are to transfer the patient to [facility] long hospital for simulation/radiation treatment. Staging CT chest-poor study but no evidence of metastatic disease.  Leukocytosis: Likely secondary to steroids-afebrile-no other indication of infection-watch closely  as he is at risk for aspiration.  Watch closely now that steroids have been discontinued.  Persistent A. fib with RVR: Rate controlled this morning-remains on metoprolol and Cardizem-as discussed with ENT-Dr. Marcelline Deist on 7/27-no anticoagulation for 1 week from 7/25-if no further bleeding episodes-okay to resume anticoagulation.  Continue rate control with Cardizem-cardiology following- appreciate input.    Dysphagia: Secondary to laryngeal mass-spoke with SLP-recommendations are for a dysphagia 3 diet-in anticipation of radiation and due to ongoing poor oral intake-PEG tube placed by IR on 7/30. Will need to consult nutrition therapy for PEG tube feeding once IR okays to use PEG tube.   HTN: BP stable-continue metoprolol and Cardizem.  History of idiopathic pulmonary fibrosis: Appears stable-followed by Dr. Joya Gaskins in the outpatient setting-etiology thought to be recurrent aspiration.  Normocytic anemia: Mild-stable for follow-up.  Debility/deconditioning: Persistent patient's history-has been gradually declining in function over the past few months-now at ALF-we will obtain PT/OT eval.  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  Family Communication: Sister over [phone] on 7/30  Disposition Plan: Status is: Inpatient  Remains inpatient appropriate because:Inpatient level of care appropriate due to severity of illness  Dispo:  Patient From: ALF  Planned Disposition: To be determined  Expected discharge date: 05/25/20  Medically stable for discharge: No   Barriers to Discharge: Stridor due to laryngeal mass-s/p tracheotomy-postoperative course complicated by bleeding-needing PEG tube placement-and initiation of radiation treatment.  Antimicrobial agents: Anti-infectives (From admission, onward)   Start     Dose/Rate Route Frequency Ordered Stop   05/28/20 1002  ceFAZolin (ANCEF) IVPB 2g/100 mL premix        over 30 Minutes  Continuous PRN 05/28/20 1008 05/28/20 1002       Time spent: 25 minutes-Greater than 50% of this time was spent in counseling, explanation of diagnosis, planning of further management, and coordination of care.  MEDICATIONS: Scheduled Meds: . (feeding supplement) PROSource Plus  30 mL Oral BID BM  . chlorhexidine  15 mL Mouth Rinse BID  . Chlorhexidine Gluconate Cloth  6 each Topical Daily  . diltiazem  120 mg Oral Daily  . feeding supplement (ENSURE ENLIVE)  237 mL Oral TID BM  . lidocaine      . mouth rinse  15 mL Mouth Rinse q12n4p  . metoprolol tartrate  12.5 mg Oral BID  . mometasone-formoterol  2 puff Inhalation BID  . multivitamin with minerals  1 tablet Oral Daily   Continuous Infusions: . sodium chloride Stopped (05/24/20 1450)  . dextrose 5 % and 0.9% NaCl 20 mL/hr at 05/28/20 0330   PRN Meds:.sodium chloride, acetaminophen **OR** acetaminophen, ipratropium-albuterol, metoprolol tartrate, ondansetron **OR** ondansetron (ZOFRAN) IV   PHYSICAL EXAM: Vital  signs: Vitals:   05/28/20 1010 05/28/20 1015 05/28/20 1020 05/28/20 1125  BP: 125/67 128/73 105/73   Pulse: 78 86 (!) 154 51  Resp: 18 17 (!) 27 22  Temp:      TempSrc:      SpO2: 99% 98% 97% 95%  Weight:      Height:       Filed Weights   05/21/20 1900 05/23/20 2126 05/24/20 2101  Weight: 50 kg 46.6 kg 45.8 kg   Body mass index is 16.8 kg/m.   Gen Exam:Alert awake-not in any distress HEENT:atraumatic, normocephalic Chest: B/L clear to auscultation anteriorly CVS:S1S2 regular Abdomen:soft non tender, non distended Extremities:no edema Neurology: Non focal Skin: no rash  I have personally reviewed following labs and imaging studies  LABORATORY DATA: CBC: Recent Labs  Lab 05/21/20 1858 05/22/20 0431 05/24/20 0119 05/25/20 0316 05/26/20 0221 05/27/20 0235 05/28/20 0358  WBC 7.5   < > 16.9* 16.5* 23.3* 26.6* 19.0*  NEUTROABS 5.3  --   --   --   --   --   --   HGB 11.9*   < > 11.6* 12.1* 12.5* 11.1* 11.7*  HCT 33.5*   < > 33.2* 33.3* 35.1* 31.5* 32.5*  MCV 82.9   < > 80.2 80.2 79.6* 79.9* 80.8  PLT 173   < > 173 259 182 174 183   < > = values in this interval not displayed.    Basic Metabolic Panel: Recent Labs  Lab 05/24/20 0119 05/25/20 0316 05/26/20 0221 05/27/20 0235 05/28/20 0358  NA 140 139 138 136 138  K 3.7 3.9 3.8 3.7 3.7  CL 110 107 105 103 104  CO2 25 24 24 23 26   GLUCOSE 138* 138* 137* 173* 102*  BUN 16 15 17 18 14   CREATININE 0.83 0.91 0.75 0.82 0.69  CALCIUM 8.6* 8.5* 8.5* 8.3* 8.4*  MG 2.0 1.9  --   --   --     GFR: Estimated Creatinine Clearance: 47.7 mL/min (by C-G formula based on SCr of 0.69 mg/dL).  Liver Function Tests: Recent Labs  Lab 05/24/20 0119 05/25/20 0316 05/26/20 0221 05/27/20 0235 05/28/20 0358  AST 14* 14* 13* 13* 18  ALT 11 11 11 11 15   ALKPHOS 44 45 49 44 49  BILITOT 0.8 0.9 1.2 0.9 1.1  PROT 5.6* 5.7* 5.9* 5.5* 5.4*  ALBUMIN 2.7* 2.7* 2.9* 2.5* 2.4*   No results for input(s): LIPASE, AMYLASE in the  last 168 hours. No results for input(s): AMMONIA in the last 168 hours.  Coagulation Profile: No results for input(s): INR, PROTIME in the last 168 hours.  Cardiac Enzymes: No results for input(s): CKTOTAL, CKMB, CKMBINDEX, TROPONINI in the last 168 hours.  BNP (last 3 results) No results for input(s): PROBNP in the last 8760 hours.  Lipid Profile: No results for input(s): CHOL, HDL, LDLCALC, TRIG, CHOLHDL, LDLDIRECT in the last 72 hours.  Thyroid Function Tests: No results for input(s): TSH, T4TOTAL, FREET4, T3FREE, THYROIDAB in the last 72 hours.  Anemia Panel: No results for input(s): VITAMINB12, FOLATE, FERRITIN, TIBC, IRON, RETICCTPCT in the last 72 hours.  Urine analysis:    Component Value Date/Time   COLORURINE YELLOW 07/14/2015 1352   APPEARANCEUR CLEAR 07/14/2015 1352   LABSPEC 1.015 07/14/2015 1352   PHURINE 7.5 07/14/2015 1352   GLUCOSEU NEGATIVE 07/14/2015 1352   HGBUR NEGATIVE 07/14/2015 1352   BILIRUBINUR NEGATIVE 07/14/2015 1352   KETONESUR NEGATIVE 07/14/2015 1352   PROTEINUR NEGATIVE 07/14/2015 1352   UROBILINOGEN 2.0 (H) 07/14/2015 1352   NITRITE NEGATIVE 07/14/2015 1352   LEUKOCYTESUR NEGATIVE 07/14/2015 1352    Sepsis Labs: Lactic Acid, Venous No results found for: LATICACIDVEN  MICROBIOLOGY: Recent Results (from the past 240 hour(s))  SARS Coronavirus 2 by RT PCR (hospital order, performed in [place] hospital lab) Nasopharyngeal Nasopharyngeal Swab     Status: None   Collection Time: 05/21/20  7:19 PM   Specimen: Nasopharyngeal Swab  Result Value Ref Range Status   SARS Coronavirus 2 NEGATIVE NEGATIVE Final    Comment: (NOTE) SARS-CoV-2 target nucleic acids are NOT DETECTED.  The SARS-CoV-2 RNA is generally detectable in upper and lower respiratory specimens during the acute phase of infection. The lowest concentration of SARS-CoV-2 viral copies this assay can detect is 250 copies / mL. A negative result does not preclude SARS-CoV-2  infection and should not be used as the sole basis for treatment or other patient management decisions.  A negative result may occur with improper specimen collection / handling, submission of specimen other than nasopharyngeal swab, presence of viral mutation(s) within the areas targeted by this assay, and inadequate number of viral copies (<250 copies / mL). A negative result must be combined with clinical observations, patient history, and epidemiological information.  Fact Sheet for Patients:   StrictlyIdeas.no  Fact Sheet for Healthcare Providers: BankingDealers.co.za  This test is not yet approved or  cleared by the [place] FDA and has been authorized for detection and/or diagnosis of SARS-CoV-2 by FDA under an Emergency Use Authorization (EUA).  This EUA will remain in effect (meaning this test can be used) for the duration of the COVID-19 declaration under Section 564(b)(1) of the Act, 21 U.S.C. section 360bbb-3(b)(1), unless the authorization is terminated or revoked  sooner.  Performed at [facility], [place] [address], [place], [place] [address]   MRSA PCR Screening     Status: None   Collection Time: 05/22/20  8:54 PM   Specimen: Nasal Mucosa; Nasopharyngeal  Result Value Ref Range Status   MRSA by PCR NEGATIVE NEGATIVE Final    Comment:        The GeneXpert MRSA Assay (FDA approved for NASAL specimens only), is one component of a comprehensive MRSA colonization surveillance program. It is not intended to diagnose MRSA infection nor to guide or monitor treatment for MRSA infections. Performed at [facility], [place] [address] [place],  [address]     RADIOLOGY STUDIES/RESULTS: CT CHEST W CONTRAST  Result Date: 05/28/2020 CLINICAL DATA:  New diagnosis head and neck cancer, staging EXAM: CT CHEST WITH CONTRAST TECHNIQUE: Multidetector CT imaging of the chest was  performed during intravenous contrast administration. CONTRAST:  74mL OMNIPAQUE IOHEXOL 300 MG/ML  SOLN COMPARISON:  CT neck, 05/21/2020, CT chest, 03/25/2020 FINDINGS: Lower neck: Right-sided supraglottic mass is again noted, better assessed by recent CT of the neck. Cardiovascular: Scattered aortic atherosclerosis. Cardiomegaly. Left coronary artery calcifications. No pericardial effusion. Mediastinum/Nodes: No enlarged mediastinal, hilar, or axillary lymph nodes. Interval tracheostomy with associated pneumomediastinum and subcutaneous emphysema about the neck. Frothy debris in the lower trachea. Esophagus demonstrates no significant findings. Lungs/Pleura: Mild centrilobular emphysema. Examination of the lung parenchyma is significantly limited by breath motion artifact. Within this limitation, unchanged, bibasilar predominant pattern of pulmonary fibrosis featuring irregular peripheral interstitial opacity, traction bronchiectasis, subpleural bronchiolectasis, and honeycombing at the lung bases. No pleural effusion or pneumothorax. Upper Abdomen: No acute abnormality. Musculoskeletal: No chest wall mass or suspicious bone lesions identified. IMPRESSION: 1. Examination is limited by breath motion artifact. Within this limitation, no evidence of metastatic disease in the chest. 2. Redemonstrated moderate UIP pattern pulmonary fibrosis, in keeping with clinical diagnosis of IPF. 3. Interval tracheostomy with associated pneumomediastinum and subcutaneous emphysema about the neck. 4. Emphysema (ICD10-J43.9). 5. Coronary artery disease. Aortic Atherosclerosis (ICD10-I70.0). Electronically Signed   By: Eddie Candle M.D.   On: 05/28/2020 08:07   IR GASTROSTOMY TUBE MOD SED  Result Date: 05/28/2020 INDICATION: History of head neck cancer. Please place percutaneous gastrostomy tube for enteric nutrition supplementation purposes. EXAM: PULL TROUGH GASTROSTOMY TUBE PLACEMENT COMPARISON:  Chest CT-05/28/2020; 03/26/2020;  CT abdomen and pelvis-07/10/2016 MEDICATIONS: Ancef 2 gm IV; Antibiotics were administered within 1 hour of the procedure. Glucagon 1 mg IV CONTRAST:  50 mL of Omnipaque 300 administered into the gastric lumen. ANESTHESIA/SEDATION: Moderate (conscious) sedation was employed during this procedure. A total of Versed 0.5 mg and Fentanyl 25 mcg was administered intravenously. Moderate Sedation Time: 22 minutes. The patient's level of consciousness and vital signs were monitored continuously by radiology nursing throughout the procedure under my direct supervision. FLUOROSCOPY TIME:  2 minutes, 24 seconds (2 mGy) COMPLICATIONS: None immediate. PROCEDURE: Informed written consent was obtained from the patient following explanation of the procedure, risks, benefits and alternatives. A time out was performed prior to the initiation of the procedure. Ultrasound scanning was performed to demarcate the edge of the left lobe of the liver. Maximal barrier sterile technique utilized including caps, mask, sterile gowns, sterile gloves, large sterile drape, hand hygiene and Betadine prep. The left upper quadrant was sterilely prepped and draped. An oral gastric catheter was inserted into the stomach under fluoroscopy. The existing nasogastric feeding tube was removed. The left costal margin and air opacified transverse colon  were identified and avoided. Air was injected into the stomach for insufflation and visualization under fluoroscopy. Under sterile conditions a 17 gauge trocar needle was utilized to access the stomach percutaneously beneath the left subcostal margin after the overlying soft tissues were anesthetized with 1% Lidocaine with epinephrine. Needle position was confirmed within the stomach with aspiration of air and injection of small amount of contrast. A single T tack was deployed for gastropexy. Over an Amplatz guide wire, a 9-French sheath was inserted into the stomach. A snare device was utilized to capture the  oral gastric catheter. The snare device was pulled retrograde from the stomach up the esophagus and out the oropharynx. The 20-French pull-through gastrostomy was connected to the snare device and pulled antegrade through the oropharynx down the esophagus into the stomach and then through the percutaneous tract external to the patient. The gastrostomy was assembled externally. Contrast injection confirms position in the stomach. Several spot radiographic images were obtained in various obliquities for documentation. The patient tolerated procedure well without immediate post procedural complication. FINDINGS: After successful fluoroscopic guided placement, the gastrostomy tube is appropriately positioned with internal disc against the ventral aspect of the gastric lumen. IMPRESSION: Successful fluoroscopic insertion of a 20-French pull-through gastrostomy tube. The gastrostomy may be used immediately for medication administration and in 24 hrs for the initiation of feeds. Electronically Signed   By: Sandi Mariscal M.D.   On: 05/28/2020 11:06     LOS: 7 days   Oren Binet, MD  [facility]    To contact the attending provider between 7A-7P or the covering provider during after hours 7P-7A, please log into the web site [email or web address] and access using universal [place] password for that web site. If you do not have the password, please call the hospital operator.  05/28/2020, 11:38 AM

## 2020-05-28 NOTE — Progress Notes (Signed)
Physical Therapy Treatment Patient Details Name: [name] MRN: [id number] DOB: [birth year] Today's Date: 05/28/2020    History of Present Illness Pt is 79yo male presenting to the ED with increased stridor and SOB, recent history of choking on his food. 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 Received direct laryngoscopy and trach tube placement 05/23/20. Pt had PEG placed 05/28/20.  Plan is to tx to [facility] for chemo/radiation. PMH supraglottic carcinoma/mass, hx seizurse, A-fib, COPD, cardiomyopathy, alcohol abuse    PT Comments    Pt motivated to participate but was limited due to significantly elevated HR with activity , but without signs of distress or fatigue.  Requiring min A for transfers and to take a few steps.  Required cues for full ROM and controlled movements with exercises. Cont POC as able.    Follow Up Recommendations  SNF;Supervision/Assistance - 24 hour     Equipment Recommendations  Rolling walker with 5" wheels    Recommendations for Other Services       Precautions / Restrictions Precautions Precautions: Fall;Other (comment) Precaution Comments: Trach; Watch HR and O2    Mobility  Bed Mobility Overal bed mobility: Needs Assistance Bed Mobility: Supine to Sit;Sit to Supine     Supine to sit: Min assist Sit to supine: Min guard   General bed mobility comments: Assist to manage lines/leads and to elevate trunk  Transfers Overall transfer level: Needs assistance Equipment used: Rolling walker (2 wheeled) Transfers: Sit to/from Stand Sit to Stand: Min guard         General transfer comment: Min guard for safety; cues for safe hand placement; performed x 2  Ambulation/Gait Ambulation/Gait assistance: Min guard Gait Distance (Feet): 5 Feet Assistive device: Rolling walker (2 wheeled) Gait Pattern/deviations: Decreased stride length Gait velocity: decreased   General Gait Details: limited due to elevated HR; cues  for RW use   Stairs             Wheelchair Mobility    Modified Rankin (Stroke Patients Only)       Balance Overall balance assessment: Needs assistance Sitting-balance support: No upper extremity supported;Feet supported Sitting balance-Leahy Scale: Good     Standing balance support: Bilateral upper extremity supported Standing balance-Leahy Scale: Poor Standing balance comment: required RW                            Cognition Arousal/Alertness: Awake/alert Behavior During Therapy: Flat affect Overall Cognitive Status: Difficult to assess                                 General Comments: followed simple commands very well and able to answer yes/no to simple questions; communication limited by trach collar; no family present to help determine true baseline      Exercises General Exercises - Lower Extremity Ankle Circles/Pumps: AROM;Both;10 reps;Seated Long Arc Quad: AROM;Both;10 reps;Seated Hip Flexion/Marching: AROM;Both;10 reps;Seated    General Comments General comments (skin integrity, edema, etc.):  Pt was on trach collar 5 LPM with sats 90 or >.   HR was 90's rest, up to 115-120 sitting, 120-130 standing, took a few steps and HR 160-170 and short time in 180's.  Pt was returned to sitting with HR coming down to 120-140.  Returned to supine and back to 90's.  BP was stable in all positions.  Noted clear rhythm without artifact.  Pt does have afib.  Notified RN.      Pertinent Vitals/Pain Pain Assessment: No/denies pain    Home Living                      Prior Function            PT Goals (current goals can now be found in the care plan section) Acute Rehab PT Goals Patient Stated Goal: not stated PT Goal Formulation: Patient unable to participate in goal setting Time For Goal Achievement: 06/09/20 Potential to Achieve Goals: Fair Progress towards PT goals: Progressing toward goals    Frequency    Min  2X/week      PT Plan Current plan remains appropriate    Co-evaluation              AM-PAC PT "6 Clicks" Mobility   Outcome Measure  Help needed turning from your back to your side while in a flat bed without using bedrails?: A Little Help needed moving from lying on your back to sitting on the side of a flat bed without using bedrails?: A Little Help needed moving to and from a bed to a chair (including a wheelchair)?: A Little Help needed standing up from a chair using your arms (e.g., wheelchair or bedside chair)?: A Little Help needed to walk in hospital room?: A Little Help needed climbing 3-5 steps with a railing? : A Lot 6 Click Score: 17    End of Session Equipment Utilized During Treatment: Oxygen Activity Tolerance: Other (comment) (limited by HR) Patient left: in bed;with call bell/phone within reach;with bed alarm set Nurse Communication: Mobility status (HR) PT Visit Diagnosis: Unsteadiness on feet (R26.81);Muscle weakness (generalized) (M62.81);Difficulty in walking, not elsewhere classified (R26.2)     Time: 0630-1601 PT Time Calculation (min) (ACUTE ONLY): 20 min  Charges:  $Therapeutic Activity: 8-22 mins                     Abran Richard, PT Acute Rehab Services Pager [phone] [facility] [phone]     Karlton Lemon 05/28/2020, 4:07 PM

## 2020-05-28 NOTE — Progress Notes (Signed)
Agree with plan for XRT.  Appreciate PEG by radiology.  I will sign off for now.  Please reconsult prn.  Patient needs follow up with ENT one month after finishing XRT.

## 2020-05-28 NOTE — Significant Event (Signed)
HOSPITAL MEDICINE OVERNIGHT EVENT NOTE  Notified by nursing bed at [facility] is available.    Chart reviewed, patient to proceed to be transferred to [facility] bed for radiation therapy.  Of note, patient has been occasionally going into rapid Afib due to not taking his scheduled oral Metoprolol.  Each episode of Afib has been successfully treated with PRN IV Metoprolol.  Will discontinue oral Metoprolol and place on scheduled Metoprolol until patient is able to use recently placed PEG.  Patient should go to a telemetry bed at [facility].   Sherryll Burger Larri Brewton

## 2020-05-28 NOTE — Sedation Documentation (Signed)
Patient transported back to 2w. Once in room, hooked back up to bedside monitor. Suctioned via trach and split gauze changed around trach. Patient attached back to humidified oxygen, sats in the high 90s. Patient denies any pain at this time.

## 2020-05-28 NOTE — Progress Notes (Signed)
Pt prepared for transfer to [facility]. Report given Pt HR has maintained in 80-100 after IV Lopressor given VSS. trach care performed and dressing changed. Belongings at bedside, no distress noted. Dr Cyd Silence aware of pt transfer and notified of increased HR at beginning of shift with given IV Lopressor. Receiving Nurse Franchista aware that IV Lopressor scheduled now after rapid afib episode and next dose due at 0100. Awaiting transport to [facility].

## 2020-05-28 NOTE — Procedures (Signed)
Pre procedure Dx: Dysphagia Post Procedure Dx: Same  Successful fluoroscopic guided insertion of gastrostomy tube.   The gastrostomy tube may be used immediately for medications.   Tube feeds may be initiated in 24 hours as per the primary team.    EBL: Trace  Complications: None immediate  Jay Sharetha Newson, MD Pager #: [phone]     

## 2020-05-29 DIAGNOSIS — R0602 Shortness of breath: Secondary | ICD-10-CM | POA: Diagnosis not present

## 2020-05-29 LAB — GLUCOSE, CAPILLARY
Glucose-Capillary: 82 mg/dL (ref 70–99)
Glucose-Capillary: 84 mg/dL (ref 70–99)
Glucose-Capillary: 114 mg/dL — ABNORMAL HIGH (ref 70–99)
Glucose-Capillary: 137 mg/dL — ABNORMAL HIGH (ref 70–99)

## 2020-05-29 MED ORDER — OSMOLITE 1.2 CAL PO LIQD
1000.0000 mL | ORAL | Status: DC
  Administered 2020-05-29: 1000 mL
  Filled 2020-05-29 (×8): qty 1000

## 2020-05-29 MED ORDER — ADULT MULTIVITAMIN W/MINERALS CH
1.0000 | ORAL_TABLET | Freq: Every day | ORAL | Status: DC
  Administered 2020-05-30 – 2020-06-03 (×5): 1
  Filled 2020-05-29 (×5): qty 1

## 2020-05-29 MED ORDER — PROSOURCE TF PO LIQD
45.0000 mL | Freq: Every day | ORAL | Status: DC
  Administered 2020-05-30 – 2020-06-03 (×5): 45 mL
  Filled 2020-05-29 (×5): qty 45

## 2020-05-29 MED ORDER — DILTIAZEM HCL ER COATED BEADS 180 MG PO CP24
180.0000 mg | ORAL_CAPSULE | Freq: Every day | ORAL | Status: DC
  Administered 2020-05-29 – 2020-06-02 (×5): 180 mg via ORAL
  Filled 2020-05-29 (×6): qty 1

## 2020-05-29 NOTE — Progress Notes (Signed)
Patient seen for g-tube site s/p placement of 20 Fr pull-through gastrostomy tube on 7.30.21 in IR with Dr. Pascal Lux   Insertion site is clean, dry, dressed and appropriately tender to palpation. Tube is to capped on exam. No bleeding or leakage. Per staff tube has not yet been used.    Ok to begin using g-tube for medicines, tube feeds, free water, etc.  Please call IR for any questions or concerns regarding the g-tube.

## 2020-05-29 NOTE — Progress Notes (Signed)
PROGRESS NOTE  [name] CHY:[id number] DOB: 1940/11/03 DOA: 05/21/2020 PCP: Elsie Stain, MD  Brief History   Patient is a 79 y.o. male with a history of persistent atrial fibrillation, pulmonary fibrosis, COPD, remote alcohol/tobacco use-who presented with stridor/shortness of breath. 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  He was found to have epiglottic mass. ENT was consulted and he underwent tracheostomy on 7/25. 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 Postoperatively the patient had bleeding around the tracheostomy site.    CT of the chest performed on 05/28/2020 demonstrated no evidence of metastatic disease in the chest. PEG tube was placed on 05/28/2020.  Also on 05/28/2020 due to the patient's inability to take PO medications, he missed doses of his rate limiting medications and went into atrial fibrillation with RVR. After transfer the patient has been evaluated by cardiology. They have started him on Cardizem CD 180 mg that can go down his PEG tube. Heart rate has been well controlled.  The patient was transferred to [facility] for the initiation of radiation therapy. Nutrition has been asked to assist with tube feeds.  Consultants   ENT  PCCM  Procedures   05/23/2020 - Tracheostomy  05/23/2020 - Direct Laryngostomy with biopsy of right larynx  05/28/2020 - PEG tube insertion  Antibiotics   Anti-infectives (From admission, onward)   Start     Dose/Rate Route Frequency Ordered Stop   05/28/20 1002  ceFAZolin (ANCEF) IVPB 2g/100 mL premix        over 30 Minutes  Continuous PRN 05/28/20 1008 05/28/20 1002      Subjective  The patient is resting comfortably. No new complaints.  Objective   Vitals:  Vitals:   05/29/20 1534 05/29/20 1606  BP: 124/77   Pulse: 88 (!) 114  Resp:  16  Temp: 98.1 F (36.7 C)   SpO2: 100% 98%    Exam:  Constitutional:   The patient is awake, alert, and oriented x 3. No acute distress. He appears cachectic, weak, and frail. Respiratory:   No increased work of breathing.  No wheezes, rales, or rhonchi  No tactile fremitus  Trach in place. Cardiovascular:   Regular rate and rhythm  No murmurs, ectopy, or gallups.  No lateral PMI. No thrills. Abdomen:   Abdomen is soft, non-tender, non-distended  No hernias, masses, or organomegaly  Normoactive bowel sounds.  Musculoskeletal:   No cyanosis, clubbing, or edema Skin:   No rashes, lesions, ulcers  palpation of skin: no induration or nodules Neurologic:   Pt is moving all extremities. Psychiatric:   Unable to evaluate as the patient is unable to cooperate with exam.  I have personally reviewed the following:   Today's Data   Vitals, CMP, CBC, Glucoses  Imaging   CT chest with contrast  Cardiology Data   EKG: Atrial fibrillation with RVR.  Scheduled Meds:  chlorhexidine  15 mL Mouth Rinse BID   Chlorhexidine Gluconate Cloth  6 each Topical Daily   diltiazem  180 mg Oral Daily   [START ON 05/30/2020] feeding supplement (PROSource TF)  45 mL Per Tube Daily   mouth rinse  15 mL Mouth Rinse q12n4p   metoprolol tartrate  5 mg Intravenous Q6H   [START ON 05/30/2020] multivitamin with minerals  1 tablet Per Tube Daily   Continuous Infusions:  sodium chloride Stopped (05/24/20 1450)   feeding supplement (OSMOLITE 1.2 CAL)  Principal Problem:   SOB (shortness of breath) Active Problems:   Atrial fibrillation with RVR (HCC)   IPF (idiopathic pulmonary fibrosis) (HCC)   Stridor   Benign tumor of epiglottis (suprahyoid portion)   Supraglottic mass   Dysphagia   Palliative care by specialist   Goals of care, counseling/discussion   DNR (do not resuscitate) discussion   Cancer of larynx ([place])   Weakness   Tracheostomy status ([place])   Protein-calorie malnutrition, severe   LOS: 8  days   A & P   Shortness of breath with stridor secondary to supraglottic tumor: ENT was consulted. The patient is s/p tracheostomy with laryngeal biopsy on 7/25. The patient's postoperative course has been complicated by bleeding that has now resolved.  Biopsy shows squamous cell carcinoma.  ENT following-oncology/radiation oncology following.  After discussion with radiation oncology-Dr. Racheal Patches are to transfer the patient to [facility] long hospital for simulation/radiation treatment. Staging CT chest-poor study but no evidence of metastatic disease.  Leukocytosis: Likely secondary to steroids-afebrile. No overt indications of infection. Continue to watch closely as he is at risk for aspiration. I anticipate that his WBC will decrease now that he is off of steroids. Monitor.  Persistent A. fib with RVR: Rate controlled this morning on Cardizem CD 180 mg per PEG as prescribed by cardiology and as needed IV metoprolol. Per ENT (Dr. Marcelline Deist) the patient's anticoagulation must be held for 1 week following 05/23/2020. Anticoagulation may then be resumed if there are no further bleeding complications. I appreciate cardiology's assistance.  Dysphagia: Secondary to laryngeal mass. As per SLP, recommendations are for a dysphagia 3 diet. This is in anticipation of radiation and required due to ongoing poor oral intake. PEG tube placed by IR on 7/30. PEG has been cleared for use. Nutrition to place ordered for tube feeds.   HTN: BP stable. Will continue metoprolol and Cardizem. Monitor.  History of idiopathic pulmonary fibrosis: Appears stable-followed by Dr. Joya Gaskins in the outpatient setting-etiology thought to be recurrent aspiration.  Normocytic anemia: Mild-stable for follow-up.  Debility/deconditioning: Persistent. Per the patient's history, he has been gradually declining in function over the past few months. He is now residing at ALF. PT/OT to evaluate and help with determination of  disposition needs.  Palliative care discussion: The patient is an 79 year old with numerous medical problems, as outlined above. He now has a supraglottic tumor. He has been discussed with sister over the phone. She realizes that the patient has a long road ahead of him in terms of treatment. She has expressed concern that he may not tolerate treatment for laryngeal cancer.  Appreciate palliative care evaluation. Initially the family wanted to proceed with comfort measures. However, after further discussion, they are now willing to consider radiation/chemotx.   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 Nutrition has been consulted, and the patient has been started on Ensure Enlive, Magic cup, and MVI. I appreciate nutrition's services. Nutrition Problem: Severe Malnutrition  I have seen and examined this patient myself. I have spent 35 minutes in his evaluation and care.  DVT Prophylaxis: SCD's Code Status: DNR per palliative care. Family Communication: None available today.  Disposition Plan: Status is: Inpatient  Remains inpatient appropriate because:Inpatient level of care appropriate due to severity of illness  Dispo:             Patient From: ALF             Planned Disposition: To be determined  Expected discharge date: 05/25/20             Medically stable for discharge: No  Barriers to Discharge: Stridor due to laryngeal mass. The patient is s/p tracheotomy. His postoperative course has been complicated by bleeding. The patient is also needing PEG tube placement and initiation of radiation treatment.  Tally Mckinnon, DO [facility] Direct contact: see [email or web address]  7PM-7AM contact night coverage as above 05/29/2020, 6:09 PM  LOS: 8 days

## 2020-05-29 NOTE — Progress Notes (Addendum)
Pt had tube feeding bolus early afternoon and 2 hours later, vomited and had large BM, sats are stable at 95-97% on 5 liters trach collar. Will cont to monitor and MD made aware and orders noted for feeding from [place]. SRP, RN

## 2020-05-29 NOTE — Progress Notes (Signed)
Nutrition Follow-up  RD working remotely.  DOCUMENTATION CODES:   Severe malnutrition in context of chronic illness  INTERVENTION:   Tube feeding via PEG: - Initiate Osmolite 1.2 @ 20 ml/hr and increase rate by 10 ml q 6 hours until goal rate of 60 ml/hr is reached (1440 ml/day) - ProSource TF 45 ml once daily - Free water flushes per MD, recommend 100 ml free water flushes q 4 hours once tube feeding regimen is at goal  Tube feeding regimen at goal provides 1768 kcal, 91 grams of protein, and 1181 ml of H2O.   Recommended free water flushes would provide a total of 1781 ml of free water.  - Change MVI with minerals daily to per tube  NUTRITION DIAGNOSIS:   Severe Malnutrition related to chronic illness (supraglottic squamous cell carcinoma) as evidenced by percent weight loss, energy intake < or equal to 75% for > or equal to 1 month, severe muscle depletion, severe fat depletion.  Ongoing, being addressed via initiation of TF  GOAL:   Patient will meet greater than or equal to 90% of their needs  Met via TF at goal  MONITOR:   PO intake, Supplement acceptance, Labs, Weight trends, I & O's  REASON FOR ASSESSMENT:   Consult Enteral/tube feeding initiation and management  ASSESSMENT:   Pt with PMH significant for seizures, a.fib, COPD, pulmonary fibrosis, alcohol abuse admitted on 05/21/2020 with shortness of breath and stridor, then was found to have supraglottic squamous cell carcinoma.  7/25 - s/p tracheostomy and direct laryngoscopy 7/27 - s/p MBS, diet advanced to dysphagia 2 with thin liquids 7/28 - diet advanced to dysphagia 3 with thin liquids 7/29 - NPO 7/30 - s/p PEG placement  Noted plan for palliative radiation.  Discussed pt with RN via phone call. RN administered Ensure Enlive as a bolus via PEG along with ProSource Plus (both ordered to be administered PO and pt is now NPO) and pt had an episode of emesis. Will d/c ProSource Plus and Ensure Enlive  supplements given current NPO status. Discussed plan with MD. Will initiate tube feeds at low rate and slowly titrate to goal. RN aware.  Weight down 10 lbs from 7/23 to 7/26. Question accuracy given significant weight loss in short period of time (3 days). No new weights since 7/26.  Per RN edema assessment, pt with non-pitting edema to BUE and BLE.  Meal Completion: 75-100% x 4 meals prior to NPO  Medications reviewed and include: Ensure Enlive TID, ProSource Plus BID, MVI with minerals  Labs reviewed. CBG's: 82, 84  Diet Order:   Diet Order            Diet NPO time specified  Diet effective now                 EDUCATION NEEDS:   Not appropriate for education at this time  Skin:  Skin Assessment: Skin Integrity Issues: Incisions: neck Other: G-tube insertion site  Last BM:  05/29/20 large BM per RN  Height:   Ht Readings from Last 1 Encounters:  05/21/20 _0  (1.651 m)    Weight:   Wt Readings from Last 1 Encounters:  05/24/20 45.8 kg    BMI:  Body mass index is 16.8 kg/m.  Estimated Nutritional Needs:   Kcal:  1700-1900  Protein:  85-95 grams  Fluid:  >/= 1.7L/d    Gaynell Face, MS, RD, LDN Inpatient Clinical Dietitian Please see AMiON for contact information.

## 2020-05-29 NOTE — Progress Notes (Addendum)
Progress Note  Patient Name: [name] Date of Encounter: 05/29/2020  [facility] Cardiologist: No primary care provider on file. Dr. Loletha Grayer  Subjective   No new complaints.  No chest pain no shortness of breath.  Resting comfortably.   Inpatient Medications    Scheduled Meds: . (feeding supplement) PROSource Plus  30 mL Oral BID BM  . chlorhexidine  15 mL Mouth Rinse BID  . Chlorhexidine Gluconate Cloth  6 each Topical Daily  . diltiazem  120 mg Oral Daily  . feeding supplement (ENSURE ENLIVE)  237 mL Oral TID BM  . mouth rinse  15 mL Mouth Rinse q12n4p  . metoprolol tartrate  5 mg Intravenous Q6H  . mometasone-formoterol  2 puff Inhalation BID  . multivitamin with minerals  1 tablet Oral Daily   Continuous Infusions: . sodium chloride Stopped (05/24/20 1450)  . dextrose 5 % and 0.9% NaCl 20 mL/hr at 05/29/20 0758   PRN Meds: sodium chloride, acetaminophen **OR** acetaminophen, ipratropium-albuterol, ondansetron **OR** ondansetron (ZOFRAN) IV   Vital Signs    Vitals:   05/29/20 0100 05/29/20 0436 05/29/20 0640 05/29/20 0828  BP:   (!) 109/61   Pulse: 94 85 79 74  Resp:  18 16 15   Temp:   97.8 F (36.6 C)   TempSrc:   Oral   SpO2: 99% 95% 100% 97%  Weight:      Height:        Intake/Output Summary (Last 24 hours) at 05/29/2020 0905 Last data filed at 05/28/2020 2300 Gross per 24 hour  Intake 0 ml  Output 200 ml  Net -200 ml   Last 3 Weights 05/24/2020 05/23/2020 05/21/2020  Weight (lbs) 100 lb 15.5 oz 102 lb 11.8 oz 110 lb 3.2 oz  Weight (kg) 45.8 kg 46.6 kg 49.986 kg      Telemetry    Atrial fibrillation, occasional rapid ventricular response, for the most part heart rate is in the 80s and 90s.- Personally Reviewed  ECG    No new- Personally Reviewed  Physical Exam  Elderly ill, tracheostomy GEN: No acute distress.   Neck: No JVD Cardiac: Irregularly irregular, no murmurs, rubs, or gallops.  Respiratory: Clear to auscultation  bilaterally. GI: Soft, nontender, non-distended  MS: No edema; No deformity. Neuro:  Nonfocal  Psych: Normal affect   Labs    High Sensitivity Troponin:  No results for input(s): TROPONINIHS in the last 720 hours.    Chemistry Recent Labs  Lab 05/26/20 0221 05/27/20 0235 05/28/20 0358  NA 138 136 138  K 3.8 3.7 3.7  CL 105 103 104  CO2 24 23 26   GLUCOSE 137* 173* 102*  BUN 17 18 14   CREATININE 0.75 0.82 0.69  CALCIUM 8.5* 8.3* 8.4*  PROT 5.9* 5.5* 5.4*  ALBUMIN 2.9* 2.5* 2.4*  AST 13* 13* 18  ALT 11 11 15   ALKPHOS 49 44 49  BILITOT 1.2 0.9 1.1  GFRNONAA >60 >60 >60  GFRAA >60 >60 >60  ANIONGAP 9 10 8      Hematology Recent Labs  Lab 05/26/20 0221 05/27/20 0235 05/28/20 0358  WBC 23.3* 26.6* 19.0*  RBC 4.41 3.94* 4.02*  HGB 12.5* 11.1* 11.7*  HCT 35.1* 31.5* 32.5*  MCV 79.6* 79.9* 80.8  MCH 28.3 28.2 29.1  MCHC 35.6 35.2 36.0  RDW 13.3 13.4 13.5  PLT 182 174 183    BNPNo results for input(s): BNP, PROBNP in the last 168 hours.   DDimer No results for input(s): DDIMER in the  last 168 hours.   Radiology    CT CHEST W CONTRAST  Result Date: 05/28/2020 CLINICAL DATA:  New diagnosis head and neck cancer, staging EXAM: CT CHEST WITH CONTRAST TECHNIQUE: Multidetector CT imaging of the chest was performed during intravenous contrast administration. CONTRAST:  37mL OMNIPAQUE IOHEXOL 300 MG/ML  SOLN COMPARISON:  CT neck, 05/21/2020, CT chest, 03/25/2020 FINDINGS: Lower neck: Right-sided supraglottic mass is again noted, better assessed by recent CT of the neck. Cardiovascular: Scattered aortic atherosclerosis. Cardiomegaly. Left coronary artery calcifications. No pericardial effusion. Mediastinum/Nodes: No enlarged mediastinal, hilar, or axillary lymph nodes. Interval tracheostomy with associated pneumomediastinum and subcutaneous emphysema about the neck. Frothy debris in the lower trachea. Esophagus demonstrates no significant findings. Lungs/Pleura: Mild  centrilobular emphysema. Examination of the lung parenchyma is significantly limited by breath motion artifact. Within this limitation, unchanged, bibasilar predominant pattern of pulmonary fibrosis featuring irregular peripheral interstitial opacity, traction bronchiectasis, subpleural bronchiolectasis, and honeycombing at the lung bases. No pleural effusion or pneumothorax. Upper Abdomen: No acute abnormality. Musculoskeletal: No chest wall mass or suspicious bone lesions identified. IMPRESSION: 1. Examination is limited by breath motion artifact. Within this limitation, no evidence of metastatic disease in the chest. 2. Redemonstrated moderate UIP pattern pulmonary fibrosis, in keeping with clinical diagnosis of IPF. 3. Interval tracheostomy with associated pneumomediastinum and subcutaneous emphysema about the neck. 4. Emphysema (ICD10-J43.9). 5. Coronary artery disease. Aortic Atherosclerosis (ICD10-I70.0). Electronically Signed   By: Eddie Candle M.D.   On: 05/28/2020 08:07   IR GASTROSTOMY TUBE MOD SED  Result Date: 05/28/2020 INDICATION: History of head neck cancer. Please place percutaneous gastrostomy tube for enteric nutrition supplementation purposes. EXAM: PULL TROUGH GASTROSTOMY TUBE PLACEMENT COMPARISON:  Chest CT-05/28/2020; 03/26/2020; CT abdomen and pelvis-07/10/2016 MEDICATIONS: Ancef 2 gm IV; Antibiotics were administered within 1 hour of the procedure. Glucagon 1 mg IV CONTRAST:  50 mL of Omnipaque 300 administered into the gastric lumen. ANESTHESIA/SEDATION: Moderate (conscious) sedation was employed during this procedure. A total of Versed 0.5 mg and Fentanyl 25 mcg was administered intravenously. Moderate Sedation Time: 22 minutes. The patient's level of consciousness and vital signs were monitored continuously by radiology nursing throughout the procedure under my direct supervision. FLUOROSCOPY TIME:  2 minutes, 24 seconds (2 mGy) COMPLICATIONS: None immediate. PROCEDURE: Informed  written consent was obtained from the patient following explanation of the procedure, risks, benefits and alternatives. A time out was performed prior to the initiation of the procedure. Ultrasound scanning was performed to demarcate the edge of the left lobe of the liver. Maximal barrier sterile technique utilized including caps, mask, sterile gowns, sterile gloves, large sterile drape, hand hygiene and Betadine prep. The left upper quadrant was sterilely prepped and draped. An oral gastric catheter was inserted into the stomach under fluoroscopy. The existing nasogastric feeding tube was removed. The left costal margin and air opacified transverse colon were identified and avoided. Air was injected into the stomach for insufflation and visualization under fluoroscopy. Under sterile conditions a 17 gauge trocar needle was utilized to access the stomach percutaneously beneath the left subcostal margin after the overlying soft tissues were anesthetized with 1% Lidocaine with epinephrine. Needle position was confirmed within the stomach with aspiration of air and injection of small amount of contrast. A single T tack was deployed for gastropexy. Over an Amplatz guide wire, a 9-French sheath was inserted into the stomach. A snare device was utilized to capture the oral gastric catheter. The snare device was pulled retrograde from the stomach up the esophagus and out  the oropharynx. The 20-French pull-through gastrostomy was connected to the snare device and pulled antegrade through the oropharynx down the esophagus into the stomach and then through the percutaneous tract external to the patient. The gastrostomy was assembled externally. Contrast injection confirms position in the stomach. Several spot radiographic images were obtained in various obliquities for documentation. The patient tolerated procedure well without immediate post procedural complication. FINDINGS: After successful fluoroscopic guided placement,  the gastrostomy tube is appropriately positioned with internal disc against the ventral aspect of the gastric lumen. IMPRESSION: Successful fluoroscopic insertion of a 20-French pull-through gastrostomy tube. The gastrostomy may be used immediately for medication administration and in 24 hrs for the initiation of feeds. Electronically Signed   By: Sandi Mariscal M.D.   On: 05/28/2020 11:06    Cardiac Studies   Echo-EF 45 to 50%  Patient Profile     79 y.o. 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  Assessment & Plan    Atrial fibrillation with rapid ventricular response persistent -Overall better rate control.  During stimulation, activity heart rate does increase into the 130s transiently but then comes down nicely during rest.  During sleep, often is in the 50s to 60s.  I think we are at an adequate place for rate control.  I will gently increase his diltiazem from 120 up to 180 mg a day CD. -Diltiazem CD 120, metoprolol as needed 5 mg given once today at 6 AM.   Chronic anticoagulation -Once safe to do so, resume Eliquis 2.5 mg twice a day.  COPD tracheostomy -Per primary team.       For questions or updates, please contact [place] [facility] Please consult [email or web address] for contact info under        Signed, Candee Furbish, MD  05/29/2020, 9:05 AM

## 2020-05-29 NOTE — Progress Notes (Signed)
Will begin Tube feedings slowly per dietician orders. SRP, RN

## 2020-05-30 ENCOUNTER — Inpatient Hospital Stay (HOSPITAL_COMMUNITY): Payer: Medicare Other

## 2020-05-30 DIAGNOSIS — I4891 Unspecified atrial fibrillation: Secondary | ICD-10-CM | POA: Diagnosis not present

## 2020-05-30 DIAGNOSIS — R0602 Shortness of breath: Secondary | ICD-10-CM | POA: Diagnosis not present

## 2020-05-30 LAB — BASIC METABOLIC PANEL
Anion gap: 11 (ref 5–15)
BUN: 22 mg/dL (ref 8–23)
CO2: 26 mmol/L (ref 22–32)
Calcium: 8.4 mg/dL — ABNORMAL LOW (ref 8.9–10.3)
Chloride: 100 mmol/L (ref 98–111)
Creatinine, Ser: 0.65 mg/dL (ref 0.61–1.24)
GFR calc Af Amer: >60 mL/min (ref >60)
GFR calc non Af Amer: >60 mL/min (ref >60)
Glucose, Bld: 146 mg/dL — ABNORMAL HIGH (ref 70–99)
Potassium: 3.7 mmol/L (ref 3.5–5.1)
Sodium: 137 mmol/L (ref 135–145)

## 2020-05-30 LAB — CBC WITH DIFFERENTIAL/PLATELET
Abs Immature Granulocytes: 0.14 10*3/uL — ABNORMAL HIGH (ref 0.00–0.07)
Basophils Absolute: 0 10*3/uL (ref 0.0–0.1)
Basophils Relative: 0 %
Eosinophils Absolute: 0.4 10*3/uL (ref 0.0–0.5)
Eosinophils Relative: 2 %
HCT: 34.1 % — ABNORMAL LOW (ref 39.0–52.0)
Hemoglobin: 12 g/dL — ABNORMAL LOW (ref 13.0–17.0)
Immature Granulocytes: 1 %
Lymphocytes Relative: 4 %
Lymphs Abs: 0.6 10*3/uL — ABNORMAL LOW (ref 0.7–4.0)
MCH: 28.8 pg (ref 26.0–34.0)
MCHC: 35.2 g/dL (ref 30.0–36.0)
MCV: 82 fL (ref 80.0–100.0)
Monocytes Absolute: 1 10*3/uL (ref 0.1–1.0)
Monocytes Relative: 6 %
Neutro Abs: 14.5 10*3/uL — ABNORMAL HIGH (ref 1.7–7.7)
Neutrophils Relative %: 87 %
Platelets: 198 10*3/uL (ref 150–400)
RBC: 4.16 MIL/uL — ABNORMAL LOW (ref 4.22–5.81)
RDW: 13.9 % (ref 11.5–15.5)
WBC: 16.6 10*3/uL — ABNORMAL HIGH (ref 4.0–10.5)
nRBC: 0 % (ref 0.0–0.2)

## 2020-05-30 LAB — GLUCOSE, CAPILLARY
Glucose-Capillary: 116 mg/dL — ABNORMAL HIGH (ref 70–99)
Glucose-Capillary: 169 mg/dL — ABNORMAL HIGH (ref 70–99)
Glucose-Capillary: 158 mg/dL — ABNORMAL HIGH (ref 70–99)
Glucose-Capillary: 149 mg/dL — ABNORMAL HIGH (ref 70–99)
Glucose-Capillary: 147 mg/dL — ABNORMAL HIGH (ref 70–99)
Glucose-Capillary: 125 mg/dL — ABNORMAL HIGH (ref 70–99)

## 2020-05-30 MED ORDER — METOPROLOL TARTRATE 50 MG PO TABS
50.0000 mg | ORAL_TABLET | Freq: Two times a day (BID) | ORAL | Status: DC
  Administered 2020-05-30 – 2020-05-31 (×2): 50 mg via ORAL
  Filled 2020-05-30 (×3): qty 1

## 2020-05-30 MED ORDER — DEXTROSE 10 % IV SOLN: INTRAVENOUS | Status: DC

## 2020-05-30 MED ORDER — METOPROLOL TARTRATE 5 MG/5ML IV SOLN: 5.0000 mg | Freq: Four times a day (QID) | INTRAVENOUS | Status: DC | PRN

## 2020-05-30 NOTE — Progress Notes (Signed)
Occupational Therapy Treatment Patient Details Name: [name] MRN: [id number] DOB: 03-14-1941 Today's Date: 05/30/2020    History of present illness Pt is 79yo male presenting to the ED with increased stridor and SOB, recent history of choking on his food. 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 Received direct laryngoscopy and trach tube placement 05/23/20. Pt had PEG placed 05/28/20.  Plan is to tx to [facility] for chemo/radiation. PMH supraglottic carcinoma/mass, hx seizurse, A-fib, COPD, cardiomyopathy, alcohol abuse   OT comments  Rn reports patient has been bradycardic prior to treatment. HR 49, 02 sat 100% on 4L and 28% Fio2. Treatment focused on improving activity tolerance and strength. Patient transferred to side of bed with min assist for hand hold to scoot to edge of bed and min guard to transfer to recliner. Patient performed exercises while seated in recliner. O2 sat predominantly 98% with activity (otherwise unreadable due to using hand with probe) and HR in the 60s and up to 77. Patient tolerated well without complaints. Cont POC.    Follow Up Recommendations  SNF    Equipment Recommendations  Other (comment)    Recommendations for Other Services      Precautions / Restrictions Precautions Precautions: Fall;Other (comment) Precaution Comments: Trach; Peg Tube, Watch HR and O2 Restrictions Weight Bearing Restrictions: No       Mobility Bed Mobility Overal bed mobility: Needs Assistance Bed Mobility: Supine to Sit     Supine to sit: Min assist     General bed mobility comments: Min assist to scoot forward to edge of bed with hand hold.  Transfers Overall transfer level: Needs assistance   Transfers: Sit to/from Stand;Stand Pivot Transfers Sit to Stand: Min guard Stand pivot transfers: Min guard       General transfer comment: Min guard to stand from bed, min guard to stand pivot to recliner holding onto bed rail and chair  arm. Therapist managed lines and trach tube    Balance Overall balance assessment: Needs assistance Sitting-balance support: No upper extremity supported;Feet supported Sitting balance-Leahy Scale: Good     Standing balance support: Bilateral upper extremity supported Standing balance-Leahy Scale: Fair                             ADL either performed or assessed with clinical judgement   ADL                                               Vision       Perception     Praxis      Cognition Arousal/Alertness: Awake/alert Behavior During Therapy: Flat affect Overall Cognitive Status: Difficult to assess                                 General Comments: Appears functional - limited by trach. Able to follow commands.        Exercises Other Exercises Other Exercises: Overhead reaching (scaption) unilaterally with 500 ml saline bottle (approx 2 lbs) x 10  each arm, 2 sets each Other Exercises: Forward Reaching (elbow extension, with minimalt trunkl flexion) holding 760 ml bottole (approx 3 lbs) with both hands x 10, 2 sets   Shoulder Instructions  General Comments      Pertinent Vitals/ Pain       Pain Assessment: No/denies pain  Home Living                                          Prior Functioning/Environment              Frequency  Min 2X/week        Progress Toward Goals  OT Goals(current goals can now be found in the care plan section)  Progress towards OT goals: Progressing toward goals  Acute Rehab OT Goals Patient Stated Goal: not stated OT Goal Formulation: With patient Time For Goal Achievement: 06/10/20 Potential to Achieve Goals: Good  Plan Discharge plan remains appropriate    Co-evaluation                 AM-PAC OT "6 Clicks" Daily Activity     Outcome Measure   Help from another person eating meals?: A Little Help from another person taking care of  personal grooming?: A Little Help from another person toileting, which includes using toliet, bedpan, or urinal?: A Lot Help from another person bathing (including washing, rinsing, drying)?: A Little Help from another person to put on and taking off regular upper body clothing?: A Little Help from another person to put on and taking off regular lower body clothing?: A Lot 6 Click Score: 16    End of Session Equipment Utilized During Treatment: Oxygen  OT Visit Diagnosis: Unsteadiness on feet (R26.81);Other abnormalities of gait and mobility (R26.89);Muscle weakness (generalized) (M62.81)   Activity Tolerance Patient tolerated treatment well   Patient Left in chair;with call bell/phone within reach;with chair alarm set   Nurse Communication Mobility status        Time: 5465-0354 OT Time Calculation (min): 25 min  Charges: OT General Charges $OT Visit: 1 Visit OT Treatments $Therapeutic Activity: 8-22 mins $Therapeutic Exercise: 8-22 mins  Derl Barrow, OTR/L [place]  Office [phone] Pager: [place] 05/30/2020, 4:30 PM

## 2020-05-30 NOTE — Progress Notes (Signed)
Progress Note  Patient Name: [name] Date of Encounter: 05/30/2020  [facility] Cardiologist: No primary care provider on file. Dr. Lorenza [place], new  Subjective   Comfortable in bed no chest pain or shortness of breath.  Nods his head yes to answer questions   Inpatient Medications    Scheduled Meds: . chlorhexidine  15 mL Mouth Rinse BID  . Chlorhexidine Gluconate Cloth  6 each Topical Daily  . diltiazem  180 mg Oral Daily  . feeding supplement (PROSource TF)  45 mL Per Tube Daily  . mouth rinse  15 mL Mouth Rinse q12n4p  . metoprolol tartrate  5 mg Intravenous Q6H  . multivitamin with minerals  1 tablet Per Tube Daily   Continuous Infusions: . sodium chloride 10 mL/hr at 05/29/20 2330  . dextrose    . feeding supplement (OSMOLITE 1.2 CAL) Stopped (05/30/20 0654)   PRN Meds: sodium chloride, acetaminophen **OR** acetaminophen, ipratropium-albuterol, ondansetron **OR** ondansetron (ZOFRAN) IV   Vital Signs    Vitals:   05/29/20 2310 05/29/20 2320 05/30/20 0302 05/30/20 0642  BP: 109/81   (!) 127/91  Pulse: 96 86 80 100  Resp: 16  18 18   Temp: (!) 97.5 F (36.4 C)   98.7 F (37.1 C)  TempSrc: Oral   Oral  SpO2: 100% 98% 98% 100%  Weight:      Height:        Intake/Output Summary (Last 24 hours) at 05/30/2020 0821 Last data filed at 05/30/2020 0500 Gross per 24 hour  Intake 193.1 ml  Output 725 ml  Net -531.9 ml   Last 3 Weights 05/24/2020 05/23/2020 05/21/2020  Weight (lbs) 100 lb 15.5 oz 102 lb 11.8 oz 110 lb 3.2 oz  Weight (kg) 45.8 kg 46.6 kg 49.986 kg      Telemetry    Atrial fibrillation with majority of heart rates in the 80s to 90s at rest.  Occasional 50s at night.  Sometimes will go up to 120s to 130s with activity (highest for approximately 10 seconds 189 bpm.- Personally Reviewed  ECG    No new- Personally Reviewed  Physical Exam  GEN: Elderly in bed tracheostomy, comfortable, eyes open.  Thin HEENT: normal Tracheostomy Neck: no JVD,  carotid bruits, or masses Cardiac: Irregularly irregular  no murmurs, rubs, or gallops,no edema  Respiratory:  clear to auscultation bilaterally, normal work of breathing GI: soft, nontender, nondistended, + BS MS: no deformity or atrophy  Skin: warm and dry, no rash Neuro:  Alert and Oriented x 3, Strength and sensation are intact Psych: euthymic mood, full affect   Labs    High Sensitivity Troponin:  No results for input(s): TROPONINIHS in the last 720 hours.    Chemistry Recent Labs  Lab 05/26/20 0221 05/26/20 0221 05/27/20 0235 05/28/20 0358 05/30/20 0445  NA 138   < > 136 138 137  K 3.8   < > 3.7 3.7 3.7  CL 105   < > 103 104 100  CO2 24   < > 23 26 26   GLUCOSE 137*   < > 173* 102* 146*  BUN 17   < > 18 14 22   CREATININE 0.75   < > 0.82 0.69 0.65  CALCIUM 8.5*   < > 8.3* 8.4* 8.4*  PROT 5.9*  --  5.5* 5.4*  --   ALBUMIN 2.9*  --  2.5* 2.4*  --   AST 13*  --  13* 18  --   ALT 11  --  11 15  --   ALKPHOS 49  --  44 49  --   BILITOT 1.2  --  0.9 1.1  --   GFRNONAA >60   < > >60 >60 >60  GFRAA >60   < > >60 >60 >60  ANIONGAP 9   < > 10 8 11    < > = values in this interval not displayed.     Hematology Recent Labs  Lab 05/27/20 0235 05/28/20 0358 05/30/20 0445  WBC 26.6* 19.0* 16.6*  RBC 3.94* 4.02* 4.16*  HGB 11.1* 11.7* 12.0*  HCT 31.5* 32.5* 34.1*  MCV 79.9* 80.8 82.0  MCH 28.2 29.1 28.8  MCHC 35.2 36.0 35.2  RDW 13.4 13.5 13.9  PLT 174 183 198    BNPNo results for input(s): BNP, PROBNP in the last 168 hours.   DDimer No results for input(s): DDIMER in the last 168 hours.   Radiology    IR GASTROSTOMY TUBE MOD SED  Result Date: 05/28/2020 INDICATION: History of head neck cancer. Please place percutaneous gastrostomy tube for enteric nutrition supplementation purposes. EXAM: PULL TROUGH GASTROSTOMY TUBE PLACEMENT COMPARISON:  Chest CT-05/28/2020; 03/26/2020; CT abdomen and pelvis-07/10/2016 MEDICATIONS: Ancef 2 gm IV; Antibiotics were administered  within 1 hour of the procedure. Glucagon 1 mg IV CONTRAST:  50 mL of Omnipaque 300 administered into the gastric lumen. ANESTHESIA/SEDATION: Moderate (conscious) sedation was employed during this procedure. A total of Versed 0.5 mg and Fentanyl 25 mcg was administered intravenously. Moderate Sedation Time: 22 minutes. The patient's level of consciousness and vital signs were monitored continuously by radiology nursing throughout the procedure under my direct supervision. FLUOROSCOPY TIME:  2 minutes, 24 seconds (2 mGy) COMPLICATIONS: None immediate. PROCEDURE: Informed written consent was obtained from the patient following explanation of the procedure, risks, benefits and alternatives. A time out was performed prior to the initiation of the procedure. Ultrasound scanning was performed to demarcate the edge of the left lobe of the liver. Maximal barrier sterile technique utilized including caps, mask, sterile gowns, sterile gloves, large sterile drape, hand hygiene and Betadine prep. The left upper quadrant was sterilely prepped and draped. An oral gastric catheter was inserted into the stomach under fluoroscopy. The existing nasogastric feeding tube was removed. The left costal margin and air opacified transverse colon were identified and avoided. Air was injected into the stomach for insufflation and visualization under fluoroscopy. Under sterile conditions a 17 gauge trocar needle was utilized to access the stomach percutaneously beneath the left subcostal margin after the overlying soft tissues were anesthetized with 1% Lidocaine with epinephrine. Needle position was confirmed within the stomach with aspiration of air and injection of small amount of contrast. A single T tack was deployed for gastropexy. Over an Amplatz guide wire, a 9-French sheath was inserted into the stomach. A snare device was utilized to capture the oral gastric catheter. The snare device was pulled retrograde from the stomach up the  esophagus and out the oropharynx. The 20-French pull-through gastrostomy was connected to the snare device and pulled antegrade through the oropharynx down the esophagus into the stomach and then through the percutaneous tract external to the patient. The gastrostomy was assembled externally. Contrast injection confirms position in the stomach. Several spot radiographic images were obtained in various obliquities for documentation. The patient tolerated procedure well without immediate post procedural complication. FINDINGS: After successful fluoroscopic guided placement, the gastrostomy tube is appropriately positioned with internal disc against the ventral aspect of the gastric lumen. IMPRESSION: Successful fluoroscopic insertion  of a 20-French pull-through gastrostomy tube. The gastrostomy may be used immediately for medication administration and in 24 hrs for the initiation of feeds. Electronically Signed   By: Sandi Mariscal M.D.   On: 05/28/2020 11:06    Cardiac Studies   Echo-EF 45 to 50%  Patient Profile     79 y.o. 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  Assessment & Plan    Atrial fibrillation with rapid ventricular response persistent -Yesterday I increased his diltiazem CD to 180 mg once a day from 120. -He is still receiving IV metoprolol 5 mg every 6 hours-checked MAR. -Today lets try p.o. metoprolol instead of IV, 50 mg p.o. twice daily -I will modify his IV 5 mg metoprolol every 6 hours to as needed heart rate greater than 120 bpm. -If he is still requiring IV metoprolol, increase metoprolol p.o. to 100 mg twice a day tomorrow.  Chronic anticoagulation -Once safe to do so, resume Eliquis 2.5 mg twice a day.  COPD tracheostomy -Per primary team.  Appreciate palliative care discussion.   For questions or updates, please contact [place] Please consult [email or web address] for contact info under         Signed, Candee Furbish, MD  05/30/2020, 8:21 AM

## 2020-05-30 NOTE — Progress Notes (Signed)
PROGRESS NOTE  [name] LFY:[id number] DOB: June 12, 1941 DOA: 05/21/2020 PCP: Elsie Stain, MD  Brief History   Patient is a 79 y.o. male with a history of persistent atrial fibrillation, pulmonary fibrosis, COPD, remote alcohol/tobacco use-who presented with stridor/shortness of breath. 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  He was found to have epiglottic mass. ENT was consulted and he underwent tracheostomy on 7/25. 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 Postoperatively the patient had bleeding around the tracheostomy site.    CT of the chest performed on 05/28/2020 demonstrated no evidence of metastatic disease in the chest. PEG tube was placed on 05/28/2020.  Also on 05/28/2020 due to the patient's inability to take PO medications, he missed doses of his rate limiting medications and went into atrial fibrillation with RVR. After transfer the patient has been evaluated by cardiology. They have started him on Cardizem CD 180 mg that can go down his PEG tube. Heart rate has been well controlled while the patient is at rest, but not with activity.  The patient was transferred to [facility] for the initiation of radiation therapy. Nutrition has been asked to assist with tube feeds. Heart rate has remained intermittently out of control when the patient is active. Cardiology following. Today they have added back metoprolol 50 mg bid in place of the IV metoprolol. Heart rate in the 40's and 50's this afternoon after the change.  Consultants  . ENT . PCCM  Procedures  . 05/23/2020 - Tracheostomy . 05/23/2020 - Direct Laryngostomy with biopsy of right larynx . 05/28/2020 - PEG tube insertion  Antibiotics   Anti-infectives (From admission, onward)   Start     Dose/Rate Route Frequency Ordered Stop   05/28/20 1002  ceFAZolin (ANCEF) IVPB 2g/100 mL premix        over 30 Minutes   Continuous PRN 05/28/20 1008 05/28/20 1002     Subjective  The patient is resting comfortably. No new complaints.  Objective   Vitals:  Vitals:   05/30/20 1214 05/30/20 1300  BP: (!) 116/86   Pulse: 81 66  Resp:  14  Temp: (!) 97.5 F (36.4 C)   SpO2: 99% 98%   Exam:  Constitutional:  . The patient is awake, alert, and oriented x 3. No acute distress. He appears cachectic, weak, and frail. Respiratory:  . No increased work of breathing. . No wheezes, rales, or rhonchi . No tactile fremitus . Trach in place. Cardiovascular:  . Regular rate and rhythm . No murmurs, ectopy, or gallups. . No lateral PMI. No thrills. Abdomen:  . Abdomen is soft, non-tender, non-distended . No hernias, masses, or organomegaly . Normoactive bowel sounds.  Musculoskeletal:  . No cyanosis, clubbing, or edema Skin:  . No rashes, lesions, ulcers . palpation of skin: no induration or nodules Neurologic:  . Pt is moving all extremities. Psychiatric:  . Unable to evaluate as the patient is unable to cooperate with exam.  I have personally reviewed the following:   Today's Data  . Vitals, BMP, CBC, Glucoses  Imaging  . CT chest with contrast  Cardiology Data  . EKG: Atrial fibrillation with RVR.  Scheduled Meds: . chlorhexidine  15 mL Mouth Rinse BID  . diltiazem  180 mg Oral Daily  . feeding supplement (PROSource TF)  45 mL Per Tube Daily  . mouth rinse  15 mL Mouth Rinse q12n4p  . metoprolol tartrate  50  mg Oral BID  . multivitamin with minerals  1 tablet Per Tube Daily   Continuous Infusions: . sodium chloride 10 mL/hr at 05/29/20 2330  . dextrose 30 mL/hr at 05/30/20 0832  . feeding supplement (OSMOLITE 1.2 CAL) Stopped (05/30/20 0654)    Principal Problem:   SOB (shortness of breath) Active Problems:   Atrial fibrillation with RVR (HCC)   IPF (idiopathic pulmonary fibrosis) (HCC)   Stridor   Benign tumor of epiglottis (suprahyoid portion)   Supraglottic mass    Dysphagia   Palliative care by specialist   Goals of care, counseling/discussion   DNR (do not resuscitate) discussion   Cancer of larynx ([place])   Weakness   Tracheostomy status ([place])   Protein-calorie malnutrition, severe   LOS: 9 days   A & P   Shortness of breath with stridor secondary to supraglottic tumor: ENT was consulted. The patient is s/p tracheostomy with laryngeal biopsy on 7/25. The patient's postoperative course has been complicated by bleeding that has now resolved.  Biopsy shows squamous cell carcinoma.  ENT following-oncology/radiation oncology following. The patient has been transferred to [facility] for radiology therapy. Staging CT chest was performed at [facility]. Although it was a poor study, it showed no evidence of metastatic disease.  Leukocytosis: Likely secondary to steroids-afebrile. No overt indications of infection. Continue to watch closely as he is at risk for aspiration. I anticipate that his WBC will decrease now that he is off of steroids. Monitor.  Persistent A. fib with RVR: Rate controlled this morning on Cardizem CD 180 mg per PEG as prescribed by cardiology and as needed IV metoprolol. Per ENT (Dr. Marcelline Deist) the patient's anticoagulation must be held for 1 week following 05/23/2020. Today cardiology has added back metoprolol 50 mg bid. This afternoon HR at rest has been in the 40's and 50's. Anticoagulation may then be resumed if there are no further bleeding complications. I appreciate cardiology's assistance.  Dysphagia: Secondary to laryngeal mass. As per SLP, recommendations are for a dysphagia 3 diet. This is in anticipation of radiation and required due to ongoing poor oral intake. PEG tube placed by IR on 7/30. PEG has been cleared for use. Nutrition to place ordered for tube feeds.   HTN: BP stable. Will continue metoprolol and Cardizem. Monitor.  History of idiopathic pulmonary fibrosis: Appears stable-followed by Dr. Joya Gaskins in the  outpatient setting-etiology thought to be recurrent aspiration.  Normocytic anemia: Mild-stable for follow-up.  Debility/deconditioning: Persistent. Per the patient's history, he has been gradually declining in function over the past few months. He is now residing at ALF. PT/OT to evaluate and help with determination of disposition needs.  Palliative care discussion: The patient is an 79 year old with numerous medical problems, as outlined above. He now has a supraglottic tumor. He has been discussed with sister over the phone. She realizes that the patient has a long road ahead of him in terms of treatment. She has expressed concern that he may not tolerate treatment for laryngeal cancer.  Appreciate palliative care evaluation. Initially the family wanted to proceed with comfort measures. However, after further discussion, they are now willing to consider radiation/chemotx.   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 Nutrition has been consulted, and the patient has been started on Ensure Enlive, Magic cup, and MVI. I appreciate nutrition's services. Nutrition Problem: Severe Malnutrition  I have seen and examined this patient myself. I have spent 32 minutes in his evaluation  and care.  DVT Prophylaxis: SCD's Code Status: DNR per palliative care. Family Communication: None available today.  Disposition Plan: Status is: Inpatient  Remains inpatient appropriate because:Inpatient level of care appropriate due to severity of illness  Dispo:             Patient From: ALF             Planned Disposition: To be determined             Expected discharge date: 05/25/20             Medically stable for discharge: No  Barriers to Discharge: Stridor due to laryngeal mass. The patient is s/p tracheotomy. His postoperative course has been complicated by bleeding. The patient is also needing PEG tube placement and initiation of  radiation treatment.  Zeev Deakins, DO [facility] Direct contact: see [email or web address]  7PM-7AM contact night coverage as above 05/30/2020, 4:07 PM  LOS: 8 days

## 2020-05-31 ENCOUNTER — Ambulatory Visit
Admit: 2020-05-31 | Discharge: 2020-05-31 | Disposition: A | Discharge status: Home / Self Care | Payer: Medicare Other | Attending: Radiation Oncology | Admitting: Radiation Oncology

## 2020-05-31 LAB — CBC WITH DIFFERENTIAL/PLATELET
Abs Immature Granulocytes: 0.15 10*3/uL — ABNORMAL HIGH (ref 0.00–0.07)
Basophils Absolute: 0 10*3/uL (ref 0.0–0.1)
Basophils Relative: 0 %
Eosinophils Absolute: 0.3 10*3/uL (ref 0.0–0.5)
Eosinophils Relative: 2 %
HCT: 33.2 % — ABNORMAL LOW (ref 39.0–52.0)
Hemoglobin: 11.6 g/dL — ABNORMAL LOW (ref 13.0–17.0)
Immature Granulocytes: 1 %
Lymphocytes Relative: 5 %
Lymphs Abs: 0.9 10*3/uL (ref 0.7–4.0)
MCH: 28.8 pg (ref 26.0–34.0)
MCHC: 34.9 g/dL (ref 30.0–36.0)
MCV: 82.4 fL (ref 80.0–100.0)
Monocytes Absolute: 1.2 10*3/uL — ABNORMAL HIGH (ref 0.1–1.0)
Monocytes Relative: 7 %
Neutro Abs: 15.4 10*3/uL — ABNORMAL HIGH (ref 1.7–7.7)
Neutrophils Relative %: 85 %
Platelets: 211 10*3/uL (ref 150–400)
RBC: 4.03 MIL/uL — ABNORMAL LOW (ref 4.22–5.81)
RDW: 13.8 % (ref 11.5–15.5)
WBC: 17.8 10*3/uL — ABNORMAL HIGH (ref 4.0–10.5)
nRBC: 0 % (ref 0.0–0.2)

## 2020-05-31 LAB — BASIC METABOLIC PANEL
Anion gap: 13 (ref 5–15)
BUN: 27 mg/dL — ABNORMAL HIGH (ref 8–23)
CO2: 28 mmol/L (ref 22–32)
Calcium: 8.6 mg/dL — ABNORMAL LOW (ref 8.9–10.3)
Chloride: 95 mmol/L — ABNORMAL LOW (ref 98–111)
Creatinine, Ser: 0.88 mg/dL (ref 0.61–1.24)
GFR calc Af Amer: >60 mL/min (ref >60)
GFR calc non Af Amer: >60 mL/min (ref >60)
Glucose, Bld: 142 mg/dL — ABNORMAL HIGH (ref 70–99)
Potassium: 3.5 mmol/L (ref 3.5–5.1)
Sodium: 136 mmol/L (ref 135–145)

## 2020-05-31 LAB — GLUCOSE, CAPILLARY
Glucose-Capillary: 114 mg/dL — ABNORMAL HIGH (ref 70–99)
Glucose-Capillary: 116 mg/dL — ABNORMAL HIGH (ref 70–99)
Glucose-Capillary: 119 mg/dL — ABNORMAL HIGH (ref 70–99)
Glucose-Capillary: 131 mg/dL — ABNORMAL HIGH (ref 70–99)
Glucose-Capillary: 142 mg/dL — ABNORMAL HIGH (ref 70–99)
Glucose-Capillary: 147 mg/dL — ABNORMAL HIGH (ref 70–99)

## 2020-05-31 MED ORDER — METOPROLOL TARTRATE 25 MG PO TABS
25.0000 mg | ORAL_TABLET | Freq: Two times a day (BID) | ORAL | Status: DC
  Administered 2020-05-31 – 2020-06-02 (×5): 25 mg via ORAL
  Filled 2020-05-31 (×6): qty 1

## 2020-05-31 MED ORDER — OSMOLITE 1.2 CAL PO LIQD
1000.0000 mL | ORAL | Status: DC
  Administered 2020-05-31: 1000 mL
  Filled 2020-05-31 (×2): qty 1000

## 2020-05-31 NOTE — Progress Notes (Signed)
  Speech Language Pathology Treatment: Dysphagia  Patient Details Name: [name] MRN: [id number] DOB: [birth year] Today's Date: 05/31/2020 Time: 3888-7579 SLP Time Calculation (min) (ACUTE ONLY): 35 min  Assessment / Plan / Recommendation Clinical Impression  Skilled intervention including swallowing treatment for pt's dysphagia due to his epiglottic cancer.  Pt today fully alert with copious viscous secretions located just below his trach tube.  SLP cleared secretions with [place] and set up another set of suction for pt to use orally.  Labeled oral suction to keep from cross contamination.  Pt with clear oral cavity (sans dentures) but instructed him to clean his mouth for oral hygiene, pulmonary health.  Pt able to perform oral care independently.  Provided him with thin water and ice intake. No indication of aspiration with approximately 4 ounces of water.  Note on MBS - minimal silent aspiration of thin noted * SLP noticed barium in trachea when flouro turned on thus presumed it was silent.    Recommend pt at least resume po of ice chips and thin water after oral care.  Educated pt to findings and recommendations - with written precaution sign.  Provided pt with communication board and demonstrated its use, pt able to correctly point to 1/3 pictures for scenarios.    Advised RN to attempt to look line by line with pt for his potential needs, uncertain to his baseline level of literacy.  Messaged Dr Benny Lennert with recommendations.        HPI HPI: Patient is a 79 y.o. 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  He underwent MBS on 05/25/2020 with recommendation for regular/thin diet - problems with HR occured during study and SWAT team was called.  Pt had been comfort care with plans to dc to hospice, but changed to  treatment as goals.  He aspirated tube feeding and was made npo.  Concern for adynamic ileus presen per imaging of abdomen on 8/1/2021t - RN reports plan to resume tube feeding "slowly".  SLP follow up to determine readiness for po intake.        SLP Plan  Continue with current plan of care       Recommendations  Diet recommendations: Thin liquid (clears vs ice and water) Liquids provided via: Cup Medication Administration: Via alternative means Supervision: Patient able to self feed (full if diet ok) Compensations: Slow rate;Small sips/bites Postural Changes and/or Swallow Maneuvers: Seated upright 90 degrees                Oral Care Recommendations: Oral care prior to ice chip/H20 SLP Visit Diagnosis: Dysphagia, pharyngeal phase (R13.13) Plan: Continue with current plan of care       GO                [name] 05/31/2020, 11:56 AM  Terry Lime, MS [place] Office [phone]

## 2020-05-31 NOTE — Progress Notes (Signed)
Progress Note  Patient Name: [name] Date of Encounter: 05/31/2020  [facility] Cardiologist: Sanda Klein, MD   Subjective   No pain in chest.  Sitting up in bed.  No SOB, + drainage around trach Nurse called  Inpatient Medications    Scheduled Meds: . chlorhexidine  15 mL Mouth Rinse BID  . diltiazem  180 mg Oral Daily  . feeding supplement (PROSource TF)  45 mL Per Tube Daily  . mouth rinse  15 mL Mouth Rinse q12n4p  . metoprolol tartrate  50 mg Oral BID  . multivitamin with minerals  1 tablet Per Tube Daily   Continuous Infusions: . sodium chloride 10 mL/hr at 05/29/20 2330  . dextrose 30 mL/hr at 05/30/20 1735  . feeding supplement (OSMOLITE 1.2 CAL) Stopped (05/30/20 0654)   PRN Meds: sodium chloride, acetaminophen **OR** acetaminophen, ipratropium-albuterol, metoprolol tartrate, ondansetron **OR** ondansetron (ZOFRAN) IV   Vital Signs    Vitals:   05/31/20 0545 05/31/20 0642 05/31/20 0756 05/31/20 0815  BP: (!) 82/53 (!) 98/50  (!) 90/54  Pulse: 45 53 59 64  Resp: 15  14 22   Temp: 98.2 F (36.8 C) 97.6 F (36.4 C)  98.4 F (36.9 C)  TempSrc: Oral Oral  Oral  SpO2: 99% 99% 100% 96%  Weight:      Height:        Intake/Output Summary (Last 24 hours) at 05/31/2020 0906 Last data filed at 05/31/2020 0600 Gross per 24 hour  Intake 640.7 ml  Output 300 ml  Net 340.7 ml   Last 3 Weights 05/24/2020 05/23/2020 05/21/2020  Weight (lbs) 100 lb 15.5 oz 102 lb 11.8 oz 110 lb 3.2 oz  Weight (kg) 45.8 kg 46.6 kg 49.986 kg      Telemetry    Atrial fib HR to 44 at times up to 88 - Personally Reviewed  ECG    No new - Personally Reviewed  Physical Exam   GEN: No acute distress.   Neck: No JVD Cardiac: irreg irreg, no murmurs, rubs, or gallops.  Respiratory: Clear to auscultation bilaterally. Ant position, + drainage around trach GI: Soft, nontender, non-distended  MS: No edema; No deformity. Neuro:  Nonfocal  Psych: Normal affect   Labs      High Sensitivity Troponin:  No results for input(s): TROPONINIHS in the last 720 hours.    Chemistry Recent Labs  Lab 05/26/20 0221 05/26/20 0221 05/27/20 0235 05/27/20 0235 05/28/20 0358 05/30/20 0445 05/31/20 0451  NA 138   < > 136   < > 138 137 136  K 3.8   < > 3.7   < > 3.7 3.7 3.5  CL 105   < > 103   < > 104 100 95*  CO2 24   < > 23   < > 26 26 PENDING  GLUCOSE 137*   < > 173*   < > 102* 146* 142*  BUN 17   < > 18   < > 14 22 27*  CREATININE 0.75   < > 0.82   < > 0.69 0.65 0.88  CALCIUM 8.5*   < > 8.3*   < > 8.4* 8.4* 8.6*  PROT 5.9*  --  5.5*  --  5.4*  --   --   ALBUMIN 2.9*  --  2.5*  --  2.4*  --   --   AST 13*  --  13*  --  18  --   --   ALT 11  --  11  --  15  --   --   ALKPHOS 49  --  44  --  49  --   --   BILITOT 1.2  --  0.9  --  1.1  --   --   GFRNONAA >60   < > >60   < > >60 >60 >60  GFRAA >60   < > >60   < > >60 >60 >60  ANIONGAP 9   < > 10   < > 8 11 PENDING   < > = values in this interval not displayed.     Hematology Recent Labs  Lab 05/28/20 0358 05/30/20 0445 05/31/20 0451  WBC 19.0* 16.6* 17.8*  RBC 4.02* 4.16* 4.03*  HGB 11.7* 12.0* 11.6*  HCT 32.5* 34.1* 33.2*  MCV 80.8 82.0 82.4  MCH 29.1 28.8 28.8  MCHC 36.0 35.2 34.9  RDW 13.5 13.9 13.8  PLT 183 198 211    BNPNo results for input(s): BNP, PROBNP in the last 168 hours.   DDimer No results for input(s): DDIMER in the last 168 hours.   Radiology    DG Abd 1 View  Result Date: 05/30/2020 CLINICAL DATA:  Vomiting. EXAM: ABDOMEN - 1 VIEW COMPARISON:  CT, 07/10/2016 FINDINGS: There are gas-filled loops of mildly distended bowel. There is some contrast within a mostly decompressed descending and sigmoid colon and in the rectum. Gastrostomy tube projects in the left mid to upper abdomen. IMPRESSION: 1. Mild diffuse gaseous distention of bowel, most likely a mild diffuse adynamic ileus. Partial bowel obstruction is not excluded but felt less likely. Electronically Signed   By: Lajean Manes  M.D.   On: 05/30/2020 08:58    Cardiac Studies   Echo 03/23/20 IMPRESSIONS    1. Left ventricular ejection fraction, by estimation, is 45 to 50%. The  left ventricle has normal function. The left ventricle demonstrates global  hypokinesis. There is mild left ventricular hypertrophy of the basal  segment. Left ventricular diastolic  function could not be evaluated.  2. Right ventricular systolic function is mildly reduced. The right  ventricular size is normal. There is normal pulmonary artery systolic  pressure.  3. The mitral valve is grossly normal. Mild mitral valve regurgitation.  4. The aortic valve is tricuspid. Aortic valve regurgitation is trivial.  Mild aortic valve sclerosis is present, with no evidence of aortic valve  stenosis.  5. The inferior vena cava is normal in size with greater than 50%  respiratory variability, suggesting right atrial pressure of 3 mmHg.   FINDINGS  Left Ventricle: Left ventricular ejection fraction, by estimation, is 45  to 50%. The left ventricle has normal function. The left ventricle  demonstrates global hypokinesis. The left ventricular internal cavity size  was normal in size. There is mild left  ventricular hypertrophy of the basal segment. Left ventricular diastolic  function could not be evaluated due to atrial fibrillation. Left  ventricular diastolic function could not be evaluated.   Right Ventricle: The right ventricular size is normal. No increase in  right ventricular wall thickness. Right ventricular systolic function is  mildly reduced. There is normal pulmonary artery systolic pressure. The  tricuspid regurgitant velocity is 1.89  m/s, and with an assumed right atrial pressure of 3 mmHg, the estimated  right ventricular systolic pressure is 77.8 mmHg.   Left Atrium: Left atrial size was normal in size.   Right Atrium: Right atrial size was normal in size.   Pericardium: There is no evidence  of pericardial  effusion.   Mitral Valve: The mitral valve is grossly normal. Mild mitral valve  regurgitation.   Tricuspid Valve: The tricuspid valve is grossly normal. Tricuspid valve  regurgitation is trivial.   Aortic Valve: The aortic valve is tricuspid. Aortic valve regurgitation is  trivial. Mild aortic valve sclerosis is present, with no evidence of  aortic valve stenosis.   Pulmonic Valve: The pulmonic valve was normal in structure. Pulmonic valve  regurgitation is not visualized.   Aorta: The aortic root and ascending aorta are structurally normal, with  no evidence of dilitation.   Venous: The inferior vena cava is normal in size with greater than 50%  respiratory variability, suggesting right atrial pressure of 3 mmHg.   IAS/Shunts: No atrial level shunt detected by color flow Doppler.   Patient Profile     79 y.o. 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 -He is still receiving IV metoprolol 5 mg every 6 hours-checked MAR. -05/30/20 changed from IV to p.o. metoprolol, 50 mg p.o. twice daily May need lower dose with HR to 40s at night.  Will defer to Dr. Champ Mungo  -modified his IV 5 mg metoprolol every 6 hours to as needed heart rate greater than 120 bpm. -plan had been If he is still requiring IV metoprolol, increase metoprolol p.o. to 100 mg twice a day today.  Chronic anticoagulation -Once safe to do so, resume Eliquis 2.5 mg twice a day.  COPD tracheostomy -Per primary team.  PEG placed 05/28/20 per GI/IM  Appreciate palliative care discussion.      For questions or updates, please contact [place] Please consult [email or web address] for contact info under        Signed, Cecilie Kicks, NP  05/31/2020, 9:06 AM

## 2020-05-31 NOTE — Progress Notes (Signed)
Oncology Nurse Navigator Documentation  Met with patient during initial consult with Dr. Isidore Moos.  He was accompanied by his sister, [name] on speaker phone.  . Further introduced myself as his Navigator, explained my role as a member of the Care Team.   . Provided introductory explanation of radiation treatment including SIM planning and purpose of Aquaplast head and shoulder mask, showed them example.   . I encouraged them to contact me with questions/concerns as treatments/procedures begin.  They verbalized understanding of information provided.    His sister is aware to call with any further questions or concerns.   Harlow Asa, RN, BSN, OCN Head & Neck Oncology Nurse [place] [facility] [place] [phone]

## 2020-05-31 NOTE — Progress Notes (Signed)
PROGRESS NOTE  [name] WUJ:[id number] DOB: 10-26-1941 DOA: 05/21/2020 PCP: Elsie Stain, MD  Brief History   Patient is a 79 y.o. male with a history of persistent atrial fibrillation, pulmonary fibrosis, COPD, remote alcohol/tobacco use-who presented with stridor/shortness of breath. 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  He was found to have epiglottic mass. ENT was consulted and he underwent tracheostomy on 7/25. 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 Postoperatively the patient had bleeding around the tracheostomy site.    CT of the chest performed on 05/28/2020 demonstrated no evidence of metastatic disease in the chest. PEG tube was placed on 05/28/2020.  Also on 05/28/2020 due to the patient's inability to take PO medications, he missed doses of his rate limiting medications and went into atrial fibrillation with RVR. After transfer the patient has been evaluated by cardiology. They have started him on Cardizem CD 180 mg that can go down his PEG tube. Heart rate has been well controlled while the patient is at rest, but not with activity.  The patient was transferred to [facility] for the initiation of radiation therapy. Nutrition has been asked to assist with tube feeds. Heart rate has remained intermittently out of control when the patient is active. Cardiology following. Today they have added back metoprolol 50 mg bid in place of the IV metoprolol. Heart rate in the 40's and 50's this afternoon after the change.  The patient had 2 episodes of voluminous emesis yesterday. He had no change in oxygen requirements. X-ray of the abdomen demonstrated an adynamic ileus. Tube feeds were held overnight. He was given IV fluids with dextrose. This morning tube feeds were restarted at 10 cc/hr to be slowly advanced to goal rate of 40. At the recommendation of speech, the diet has  been upgraded slightly from NPO to sips of water and ice chips.  Consultants  . ENT . PCCM  Procedures  . 05/23/2020 - Tracheostomy . 05/23/2020 - Direct Laryngostomy with biopsy of right larynx . 05/28/2020 - PEG tube insertion  Antibiotics   Anti-infectives (From admission, onward)   Start     Dose/Rate Route Frequency Ordered Stop   05/28/20 1002  ceFAZolin (ANCEF) IVPB 2g/100 mL premix        over 30 Minutes  Continuous PRN 05/28/20 1008 05/28/20 1002     Subjective  The patient is resting comfortably. No new complaints.  Objective   Vitals:  Vitals:   05/31/20 1150 05/31/20 1300  BP:  (!) 91/51  Pulse: 86 (!) 55  Resp: 17 20  Temp:  98 F (36.7 C)  SpO2: 98% 99%   Exam:  Constitutional:  . The patient is awake, alert, and oriented x 3. No acute distress. He appears cachectic, weak, and frail. Respiratory:  . No increased work of breathing. . No wheezes, rales, or rhonchi . No tactile fremitus . Trach in place. Cardiovascular:  . Regular rate and rhythm . No murmurs, ectopy, or gallups. . No lateral PMI. No thrills. Abdomen:  . Abdomen is soft, non-tender, non-distended . No hernias, masses, or organomegaly . Normoactive bowel sounds.  Musculoskeletal:  . No cyanosis, clubbing, or edema Skin:  . No rashes, lesions, ulcers . palpation of skin: no induration or nodules Neurologic:  . Pt is moving all extremities. Psychiatric:  . Unable to evaluate as the patient is unable to cooperate with exam.  I have personally reviewed the following:  Today's Data  . Vitals, BMP, CBC, Glucoses  Imaging  . CT chest with contrast  Cardiology Data  . EKG: Atrial fibrillation with RVR.  Scheduled Meds: . chlorhexidine  15 mL Mouth Rinse BID  . diltiazem  180 mg Oral Daily  . feeding supplement (PROSource TF)  45 mL Per Tube Daily  . mouth rinse  15 mL Mouth Rinse q12n4p  . metoprolol tartrate  25 mg Oral BID  . multivitamin with minerals  1 tablet Per Tube  Daily   Continuous Infusions: . sodium chloride 10 mL/hr at 05/29/20 2330  . dextrose 30 mL/hr at 05/30/20 1735  . feeding supplement (OSMOLITE 1.2 CAL) Stopped (05/30/20 0654)  . feeding supplement (OSMOLITE 1.2 CAL)      Principal Problem:   SOB (shortness of breath) Active Problems:   Atrial fibrillation with RVR (HCC)   IPF (idiopathic pulmonary fibrosis) (HCC)   Stridor   Benign tumor of epiglottis (suprahyoid portion)   Supraglottic mass   Dysphagia   Palliative care by specialist   Goals of care, counseling/discussion   DNR (do not resuscitate) discussion   Cancer of larynx ([place])   Weakness   Tracheostomy status ([place])   Protein-calorie malnutrition, severe   LOS: 10 days   A & P   Shortness of breath with stridor secondary to supraglottic tumor: ENT was consulted. The patient is s/p tracheostomy with laryngeal biopsy on 7/25. The patient's postoperative course has been complicated by bleeding that has now resolved.  Biopsy shows squamous cell carcinoma.  ENT following-oncology/radiation oncology following. The patient has been transferred to [facility] for radiology therapy. Staging CT chest was performed at [facility]. Although it was a poor study, it showed no evidence of metastatic disease. The patient is awaiting radiation therapy.  Leukocytosis: Likely secondary to steroids-afebrile. No overt indications of infection. Continue to watch closely as he is at risk for aspiration. I anticipate that his WBC will decrease now that he is off of steroids. Monitor.  Persistent A. fib with RVR: Rate controlled this morning on Cardizem CD 180 mg per PEG as prescribed by cardiology and as needed IV metoprolol. Per ENT (Dr. Marcelline Deist) the patient's anticoagulation must be held for 1 week following 05/23/2020. Today cardiology has added back metoprolol 50 mg bid. This afternoon HR at rest has been in the 40's and 50's. Anticoagulation may then be resumed if there are no further  bleeding complications. I appreciate cardiology's assistance.  Adynamic ileus: Tube feeds held overnight. They have been restarted this morning at 10cc/hr. They will be increased slowly towards the goal rate of 40cc/hr.  Dysphagia: Secondary to laryngeal mass. As per SLP, recommendations are for a dysphagia 3 diet. This is in anticipation of radiation and required due to ongoing poor oral intake. PEG tube placed by IR on 7/30. PEG has been cleared for use. Nutrition to place ordered for tube feeds.   HTN: BP stable. Will continue metoprolol and Cardizem. Monitor.  History of idiopathic pulmonary fibrosis: Appears stable-followed by Dr. Joya Gaskins in the outpatient setting-etiology thought to be recurrent aspiration.  Normocytic anemia: Mild-stable for follow-up.  Debility/deconditioning: Persistent. Per the patient's history, he has been gradually declining in function over the past few months. He is now residing at ALF. PT/OT to evaluate and help with determination of disposition needs.  Palliative care discussion: The patient is an 79 year old with numerous medical problems, as outlined above. He now has a supraglottic tumor. He has been discussed with sister  over the phone. She realizes that the patient has a long road ahead of him in terms of treatment. She has expressed concern that he may not tolerate treatment for laryngeal cancer.  Appreciate palliative care evaluation. Initially the family wanted to proceed with comfort measures. However, after further discussion, they are now willing to consider radiation/chemotx.   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 Nutrition has been consulted, and the patient has been started on Ensure Enlive, Magic cup, and MVI. I appreciate nutrition's services. Nutrition Problem: Severe Malnutrition  I have seen and examined this patient myself. I have spent 30 minutes in his evaluation and  care.  DVT Prophylaxis: SCD's Code Status: DNR per palliative care. Family Communication: None available today.  Disposition Plan: Status is: Inpatient  Remains inpatient appropriate because:Inpatient level of care appropriate due to severity of illness  Dispo:             Patient From: ALF             Planned Disposition: To be determined             Expected discharge date: 05/25/20             Medically stable for discharge: No  Barriers to Discharge: Stridor due to laryngeal mass. The patient is s/p tracheotomy. His postoperative course has been complicated by bleeding. The patient is also needing PEG tube placement and initiation of radiation treatment.  Lanell Dubie, DO [facility] Direct contact: see [email or web address]  7PM-7AM contact night coverage as above 05/31/2020, 1:40 PM  LOS: 8 days

## 2020-05-31 NOTE — Care Management Important Message (Signed)
Important Message  Patient Details IM Letter given to the Patient Name: [name] MRN: [id number] Date of Birth: April 03, 1941   Medicare Important Message Given:  Yes     Kerin Salen 05/31/2020, 10:42 AM

## 2020-06-01 LAB — GLUCOSE, CAPILLARY
Glucose-Capillary: 109 mg/dL — ABNORMAL HIGH (ref 70–99)
Glucose-Capillary: 111 mg/dL — ABNORMAL HIGH (ref 70–99)
Glucose-Capillary: 117 mg/dL — ABNORMAL HIGH (ref 70–99)
Glucose-Capillary: 117 mg/dL — ABNORMAL HIGH (ref 70–99)
Glucose-Capillary: 120 mg/dL — ABNORMAL HIGH (ref 70–99)
Glucose-Capillary: 120 mg/dL — ABNORMAL HIGH (ref 70–99)
Glucose-Capillary: 120 mg/dL — ABNORMAL HIGH (ref 70–99)
Glucose-Capillary: 128 mg/dL — ABNORMAL HIGH (ref 70–99)
Glucose-Capillary: 128 mg/dL — ABNORMAL HIGH (ref 70–99)
Glucose-Capillary: 96 mg/dL (ref 70–99)

## 2020-06-01 LAB — CBC WITH DIFFERENTIAL/PLATELET
Abs Immature Granulocytes: 0.09 10*3/uL — ABNORMAL HIGH (ref 0.00–0.07)
Basophils Absolute: 0 10*3/uL (ref 0.0–0.1)
Basophils Relative: 0 %
Eosinophils Absolute: 0.4 10*3/uL (ref 0.0–0.5)
Eosinophils Relative: 3 %
HCT: 35.1 % — ABNORMAL LOW (ref 39.0–52.0)
Hemoglobin: 12.1 g/dL — ABNORMAL LOW (ref 13.0–17.0)
Immature Granulocytes: 1 %
Lymphocytes Relative: 6 %
Lymphs Abs: 0.8 10*3/uL (ref 0.7–4.0)
MCH: 28.7 pg (ref 26.0–34.0)
MCHC: 34.5 g/dL (ref 30.0–36.0)
MCV: 83.2 fL (ref 80.0–100.0)
Monocytes Absolute: 1.1 10*3/uL — ABNORMAL HIGH (ref 0.1–1.0)
Monocytes Relative: 8 %
Neutro Abs: 11 10*3/uL — ABNORMAL HIGH (ref 1.7–7.7)
Neutrophils Relative %: 82 %
Platelets: 197 10*3/uL (ref 150–400)
RBC: 4.22 MIL/uL (ref 4.22–5.81)
RDW: 14 % (ref 11.5–15.5)
WBC: 13.4 10*3/uL — ABNORMAL HIGH (ref 4.0–10.5)
nRBC: 0 % (ref 0.0–0.2)

## 2020-06-01 LAB — BASIC METABOLIC PANEL
Anion gap: 11 (ref 5–15)
BUN: 23 mg/dL (ref 8–23)
CO2: 30 mmol/L (ref 22–32)
Calcium: 8.6 mg/dL — ABNORMAL LOW (ref 8.9–10.3)
Chloride: 96 mmol/L — ABNORMAL LOW (ref 98–111)
Creatinine, Ser: 0.87 mg/dL (ref 0.61–1.24)
GFR calc Af Amer: >60 mL/min (ref >60)
GFR calc non Af Amer: >60 mL/min (ref >60)
Glucose, Bld: 133 mg/dL — ABNORMAL HIGH (ref 70–99)
Potassium: 3.3 mmol/L — ABNORMAL LOW (ref 3.5–5.1)
Sodium: 137 mmol/L (ref 135–145)

## 2020-06-01 MED ORDER — METOCLOPRAMIDE HCL 5 MG/ML IJ SOLN
5.0000 mg | Freq: Four times a day (QID) | INTRAMUSCULAR | Status: DC
  Administered 2020-06-01 – 2020-06-03 (×7): 5 mg via INTRAVENOUS
  Filled 2020-06-01 (×9): qty 2

## 2020-06-01 NOTE — Progress Notes (Signed)
PROGRESS NOTE  [name] VVZ:[id number] DOB: 1941/01/28 DOA: 05/21/2020 PCP: Elsie Stain, MD  Brief History   Patient is a 79 y.o. male with a history of persistent atrial fibrillation, pulmonary fibrosis, COPD, remote alcohol/tobacco use-who presented with stridor/shortness of breath. 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  He was found to have epiglottic mass. ENT was consulted and he underwent tracheostomy on 7/25. 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 Postoperatively the patient had bleeding around the tracheostomy site.    CT of the chest performed on 05/28/2020 demonstrated no evidence of metastatic disease in the chest. PEG tube was placed on 05/28/2020.  Also on 05/28/2020 due to the patient's inability to take PO medications, he missed doses of his rate limiting medications and went into atrial fibrillation with RVR. After transfer the patient has been evaluated by cardiology. They have started him on Cardizem CD 180 mg that can go down his PEG tube. Heart rate has been well controlled while the patient is at rest, but not with activity.  The patient was transferred to [facility] for the initiation of radiation therapy. Nutrition has been asked to assist with tube feeds. Heart rate has remained intermittently out of control when the patient is active. Cardiology following. Today they have added back metoprolol 50 mg bid in place of the IV metoprolol. Heart rate in the 40's and 50's this afternoon after the change.  The patient had 2 episodes of voluminous emesis yesterday. He had no change in oxygen requirements. X-ray of the abdomen demonstrated an adynamic ileus. Tube feeds were held overnight. He was given IV fluids with dextrose. On the morning of 05/31/2020 tube feeds were restarted at 10 cc/hr to be slowly advanced to goal rate of 40. At the recommendation of  speech, the diet has been upgraded slightly from NPO to sips of water and ice chips. However, the patient has not tolerated this and has started vomiting again. I will check an upper GI with small bowel follow through and start reglan. For the time being he will be managed on d5 1/2 NS.  Consultants  . ENT . PCCM  Procedures  . 05/23/2020 - Tracheostomy . 05/23/2020 - Direct Laryngostomy with biopsy of right larynx . 05/28/2020 - PEG tube insertion  Antibiotics   Anti-infectives (From admission, onward)   Start     Dose/Rate Route Frequency Ordered Stop   05/28/20 1002  ceFAZolin (ANCEF) IVPB 2g/100 mL premix        over 30 Minutes  Continuous PRN 05/28/20 1008 05/28/20 1002     Subjective  The patient is resting comfortably. He has been vomiting up tube feeds despite very low rate.   Objective   Vitals:  Vitals:   06/01/20 1121 06/01/20 1234  BP:  101/62  Pulse: 71 (!) 59  Resp: 17 14  Temp:  98 F (36.7 C)  SpO2: 96% 100%   Exam:  Constitutional:  . The patient is awake, alert, and oriented x 3. No acute distress. He appears cachectic, weak, and frail. Respiratory:  . No increased work of breathing. . No wheezes, rales, or rhonchi . No tactile fremitus . Trach in place. Cardiovascular:  . Regular rate and rhythm . No murmurs, ectopy, or gallups. . No lateral PMI. No thrills. Abdomen:  . Abdomen is soft, non-tender, non-distended . No hernias, masses, or organomegaly . Normoactive bowel sounds.  Musculoskeletal:  . No  cyanosis, clubbing, or edema Skin:  . No rashes, lesions, ulcers . palpation of skin: no induration or nodules Neurologic:  . Pt is moving all extremities. Psychiatric:  . Unable to evaluate as the patient is unable to cooperate with exam.  I have personally reviewed the following:   Today's Data  . Vitals, BMP, CBC, Glucoses  Imaging  . CT chest with contrast  Cardiology Data  . EKG: Atrial fibrillation with RVR.  Scheduled Meds: .  chlorhexidine  15 mL Mouth Rinse BID  . diltiazem  180 mg Oral Daily  . feeding supplement (PROSource TF)  45 mL Per Tube Daily  . mouth rinse  15 mL Mouth Rinse q12n4p  . metoprolol tartrate  25 mg Oral BID  . multivitamin with minerals  1 tablet Per Tube Daily   Continuous Infusions: . sodium chloride 10 mL/hr at 05/29/20 2330  . dextrose 30 mL/hr at 05/30/20 1735  . feeding supplement (OSMOLITE 1.2 CAL) Stopped (05/30/20 0654)  . feeding supplement (OSMOLITE 1.2 CAL) Stopped (05/31/20 2336)    Principal Problem:   SOB (shortness of breath) Active Problems:   Atrial fibrillation with RVR (HCC)   IPF (idiopathic pulmonary fibrosis) (HCC)   Stridor   Benign tumor of epiglottis (suprahyoid portion)   Supraglottic mass   Dysphagia   Palliative care by specialist   Goals of care, counseling/discussion   DNR (do not resuscitate) discussion   Cancer of larynx ([place])   Weakness   Tracheostomy status ([name])   Protein-calorie malnutrition, severe   LOS: 11 days   A & P   Shortness of breath with stridor secondary to supraglottic tumor: ENT was consulted. The patient is s/p tracheostomy with laryngeal biopsy on 7/25. The patient's postoperative course has been complicated by bleeding that has now resolved.  Biopsy shows squamous cell carcinoma.  ENT following-oncology/radiation oncology following. The patient has been transferred to [facility] for radiology therapy. Staging CT chest was performed at [facility]. Although it was a poor study, it showed no evidence of metastatic disease. The patient is awaiting radiation therapy.  Leukocytosis: Likely secondary to steroids-afebrile. No overt indications of infection. Continue to watch closely as he is at risk for aspiration. I anticipate that his WBC will decrease now that he is off of steroids. Monitor.  Persistent A. fib with RVR: Rate controlled this morning on Cardizem CD 180 mg per PEG as prescribed by cardiology and as needed IV  metoprolol. Per ENT (Dr. Marcelline Deist) the patient's anticoagulation must be held for 1 week following 05/23/2020. Today cardiology has added back metoprolol 50 mg bid. This afternoon HR at rest has been in the 40's and 50's. Anticoagulation may then be resumed if there are no further bleeding complications. I appreciate cardiology's assistance.  Adynamic ileus: Attempted to restart tube feeds at a very slow rate. The patient is still unable to tolerate them. I have started reglan and ordered UGI with SBFT. He will be maintained on D5 1/2 NS.  Dysphagia: Secondary to laryngeal mass. As per SLP, recommendations are for a dysphagia 3 diet. This is in anticipation of radiation and required due to ongoing poor oral intake. PEG tube placed by IR on 7/30. PEG has been cleared for use. Nutrition has placed an order for tube feeds. The patient has been unable to tolerate them even at very slow rate. I have started IV Reglan and ordered UGI with SBFT.    HTN: BP stable. Will continue metoprolol and Cardizem. Monitor.  History  of idiopathic pulmonary fibrosis: Appears stable-followed by Dr. Joya Gaskins in the outpatient setting-etiology thought to be recurrent aspiration.  Normocytic anemia: Mild-stable for follow-up.  Debility/deconditioning: Persistent. Per the patient's history, he has been gradually declining in function over the past few months. He is now residing at ALF. PT/OT to evaluate and help with determination of disposition needs.  Palliative care discussion: The patient is an 79 year old with numerous medical problems, as outlined above. He now has a supraglottic tumor. He has been discussed with sister over the phone. She realizes that the patient has a long road ahead of him in terms of treatment. She has expressed concern that he may not tolerate treatment for laryngeal cancer.  Appreciate palliative care evaluation. Initially the family wanted to proceed with comfort measures. However, after further  discussion, they are now willing to consider radiation/chemotx.   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 Nutrition has been consulted, and the patient has been started on Ensure Enlive, Magic cup, and MVI. I appreciate nutrition's services. Thhis is greatly complicated by the patient's adynamic ileus and inability to tolerate tube feeds.   I have seen and examined this patient myself. I have spent 42 minutes in his evaluation and care.  DVT Prophylaxis: SCD's Code Status: DNR per palliative care. Family Communication: None available today.  Disposition Plan: Status is: Inpatient  Remains inpatient appropriate because:Inpatient level of care appropriate due to severity of illness  Dispo:             Patient From: ALF             Planned Disposition: To be determined             Expected discharge date: 05/25/20             Medically stable for discharge: No  Barriers to Discharge: Stridor due to laryngeal mass. The patient is s/p tracheotomy. His postoperative course has been complicated by bleeding. The patient is also needing PEG tube placement and initiation of radiation treatment.  Naida Escalante, DO [facility] Direct contact: see [email or web address]  7PM-7AM contact night coverage as above 06/01/2020, 3:33 PM  LOS: 8 days

## 2020-06-01 NOTE — Progress Notes (Addendum)
Progress Note  Patient Name: [name] Date of Encounter: 06/01/2020  [facility] Cardiologist: Sanda Klein, MD   Subjective   HR controlled and bradycardia resolved after decreasing Lopressor to 25mg  BID.  Denies any chest pain or SOB.    Inpatient Medications    Scheduled Meds: . chlorhexidine  15 mL Mouth Rinse BID  . diltiazem  180 mg Oral Daily  . feeding supplement (PROSource TF)  45 mL Per Tube Daily  . mouth rinse  15 mL Mouth Rinse q12n4p  . metoprolol tartrate  25 mg Oral BID  . multivitamin with minerals  1 tablet Per Tube Daily   Continuous Infusions: . sodium chloride 10 mL/hr at 05/29/20 2330  . dextrose 30 mL/hr at 05/30/20 1735  . feeding supplement (OSMOLITE 1.2 CAL) Stopped (05/30/20 0654)  . feeding supplement (OSMOLITE 1.2 CAL) Stopped (05/31/20 2336)   PRN Meds: sodium chloride, acetaminophen **OR** acetaminophen, ipratropium-albuterol, metoprolol tartrate, ondansetron **OR** ondansetron (ZOFRAN) IV   Vital Signs    Vitals:   06/01/20 0309 06/01/20 0636 06/01/20 0748 06/01/20 1121  BP:  100/62    Pulse: 69 (!) 59 65 71  Resp:  18 17 17   Temp:      TempSrc:      SpO2: 100%  100% 96%  Weight:      Height:       No intake or output data in the 24 hours ending 06/01/20 1147 Last 3 Weights 05/24/2020 05/23/2020 05/21/2020  Weight (lbs) 100 lb 15.5 oz 102 lb 11.8 oz 110 lb 3.2 oz  Weight (kg) 45.8 kg 46.6 kg 49.986 kg      Telemetry    Atrial fibrillation with CVR- Personally Reviewed  ECG    No new - Personally Reviewed  Physical Exam   GEN: thin, cachetic and ill appearing HEENT: Normal NECK: No JVD; No carotid bruits LYMPHATICS: No lymphadenopathy CARDIAC:irregularly irregular, no murmurs, rubs, gallops RESPIRATORY:  Clear to auscultation without rales, wheezing or rhonchi  ABDOMEN: Soft, non-tender, non-distended MUSCULOSKELETAL:  No edema; No deformity  SKIN: Warm and dry NEUROLOGIC:  Alert and oriented x  3 PSYCHIATRIC:  Normal affect    Labs    High Sensitivity Troponin:  No results for input(s): TROPONINIHS in the last 720 hours.    Chemistry Recent Labs  Lab 05/26/20 0221 05/26/20 0221 05/27/20 0235 05/27/20 0235 05/28/20 0358 05/28/20 0358 05/30/20 0445 05/31/20 0451 06/01/20 0458  NA 138   < > 136   < > 138   < > 137 136 137  K 3.8   < > 3.7   < > 3.7   < > 3.7 3.5 3.3*  CL 105   < > 103   < > 104   < > 100 95* 96*  CO2 24   < > 23   < > 26   < > 26 28 30   GLUCOSE 137*   < > 173*   < > 102*   < > 146* 142* 133*  BUN 17   < > 18   < > 14   < > 22 27* 23  CREATININE 0.75   < > 0.82   < > 0.69   < > 0.65 0.88 0.87  CALCIUM 8.5*   < > 8.3*   < > 8.4*   < > 8.4* 8.6* 8.6*  PROT 5.9*  --  5.5*  --  5.4*  --   --   --   --  ALBUMIN 2.9*  --  2.5*  --  2.4*  --   --   --   --   AST 13*  --  13*  --  18  --   --   --   --   ALT 11  --  11  --  15  --   --   --   --   ALKPHOS 49  --  44  --  49  --   --   --   --   BILITOT 1.2  --  0.9  --  1.1  --   --   --   --   GFRNONAA >60   < > >60   < > >60   < > >60 >60 >60  GFRAA >60   < > >60   < > >60   < > >60 >60 >60  ANIONGAP 9   < > 10   < > 8   < > 11 13 11    < > = values in this interval not displayed.     Hematology Recent Labs  Lab 05/30/20 0445 05/31/20 0451 06/01/20 0458  WBC 16.6* 17.8* 13.4*  RBC 4.16* 4.03* 4.22  HGB 12.0* 11.6* 12.1*  HCT 34.1* 33.2* 35.1*  MCV 82.0 82.4 83.2  MCH 28.8 28.8 28.7  MCHC 35.2 34.9 34.5  RDW 13.9 13.8 14.0  PLT 198 211 197    BNPNo results for input(s): BNP, PROBNP in the last 168 hours.   DDimer No results for input(s): DDIMER in the last 168 hours.   Radiology    No results found.  Cardiac Studies   Echo 03/23/20 IMPRESSIONS    1. Left ventricular ejection fraction, by estimation, is 45 to 50%. The  left ventricle has normal function. The left ventricle demonstrates global  hypokinesis. There is mild left ventricular hypertrophy of the basal  segment. Left  ventricular diastolic  function could not be evaluated.  2. Right ventricular systolic function is mildly reduced. The right  ventricular size is normal. There is normal pulmonary artery systolic  pressure.  3. The mitral valve is grossly normal. Mild mitral valve regurgitation.  4. The aortic valve is tricuspid. Aortic valve regurgitation is trivial.  Mild aortic valve sclerosis is present, with no evidence of aortic valve  stenosis.  5. The inferior vena cava is normal in size with greater than 50%  respiratory variability, suggesting right atrial pressure of 3 mmHg.   FINDINGS  Left Ventricle: Left ventricular ejection fraction, by estimation, is 45  to 50%. The left ventricle has normal function. The left ventricle  demonstrates global hypokinesis. The left ventricular internal cavity size  was normal in size. There is mild left  ventricular hypertrophy of the basal segment. Left ventricular diastolic  function could not be evaluated due to atrial fibrillation. Left  ventricular diastolic function could not be evaluated.   Right Ventricle: The right ventricular size is normal. No increase in  right ventricular wall thickness. Right ventricular systolic function is  mildly reduced. There is normal pulmonary artery systolic pressure. The  tricuspid regurgitant velocity is 1.89  m/s, and with an assumed right atrial pressure of 3 mmHg, the estimated  right ventricular systolic pressure is 39.7 mmHg.   Left Atrium: Left atrial size was normal in size.   Right Atrium: Right atrial size was normal in size.   Pericardium: There is no evidence of pericardial effusion.   Mitral Valve: The mitral valve  is grossly normal. Mild mitral valve  regurgitation.   Tricuspid Valve: The tricuspid valve is grossly normal. Tricuspid valve  regurgitation is trivial.   Aortic Valve: The aortic valve is tricuspid. Aortic valve regurgitation is  trivial. Mild aortic valve sclerosis is  present, with no evidence of  aortic valve stenosis.   Pulmonic Valve: The pulmonic valve was normal in structure. Pulmonic valve  regurgitation is not visualized.   Aorta: The aortic root and ascending aorta are structurally normal, with  no evidence of dilitation.   Venous: The inferior vena cava is normal in size with greater than 50%  respiratory variability, suggesting right atrial pressure of 3 mmHg.   IAS/Shunts: No atrial level shunt detected by color flow Doppler.   Patient Profile     79 y.o. 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  COPD tracheostomy -Per primary team. --PEG placed 05/28/20 per GI/IM  Hypokalemia -need to replete to keep K+>4 and Mag>2.   Appreciate palliative care discussion.    [facility] will sign off.   Medication Recommendations:  Lopressor 25mg  BID, Cardizem CD 180mg  daily.  Eliquis once ok with surgery Other recommendations (labs, testing, etc):  none Follow up as an outpatient:  Followup with Dr. Sallyanne Kuster in 2-3 weeks  For questions or updates, please contact [facility] Please consult [email or web address] for contact info under        Signed, Fransico Him, MD  06/01/2020, 11:47 AM

## 2020-06-01 NOTE — TOC Progression Note (Addendum)
Transition of Care [facility]) - Progression Note    Patient Details  Name: [name] MRN: [id number] Date of Birth: 1941-08-24  Transition of Care [facility]) CM/SW Contact  Ross Ludwig, [place] Phone Number: 06/01/2020, 3:49 PM  Clinical Narrative:     CSW was informed by Doroteo Bradford at [facility] that patient does meet criteria for [facility] ([facility]) and they do have a bed available.  Patient however will not get Chemo or Radiation while at [facility].  CSW to speak to patient's family.  CSW attempted to call patient's sister [name], left a message to discuss LTACH at [facility].  CSW waiting for call back.   Expected Discharge Plan: Assisted Living Barriers to Discharge: Continued Medical Work up  Expected Discharge Plan and Services Expected Discharge Plan: Assisted Living                                               Social Determinants of Health (SDOH) Interventions    Readmission Risk Interventions No flowsheet data found.

## 2020-06-01 NOTE — Progress Notes (Signed)
PT working with PT at this time. PT indicates he is breathing well at this time. PT is off bedside monitor at this time.

## 2020-06-01 NOTE — Progress Notes (Signed)
Physical Therapy Treatment Patient Details Name: [name] MRN: [id number] DOB: [birth year] Today's Date: 06/01/2020    History of Present Illness Pt is 79yo male presenting to the ED with increased stridor and SOB, recent history of choking on his food. 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 Received direct laryngoscopy and trach tube placement 05/23/20. Pt had PEG placed 05/28/20.  Plan is to tx to [facility] for chemo/radiation. PMH supraglottic carcinoma/mass, hx seizurse, A-fib, COPD, cardiomyopathy, alcohol abuse    PT Comments    Assisted OOB to amb an increased distance.  General Gait Details: + 2 assist for safety (equipment IV/O2 tank) remained on TRACH collar O2 5 lys at 28% with avg sats 92 but HR increased to 140's with activity. RN in room at time. Positioned in recliner with multiple pillows for pressure relief and comfort.   Follow Up Recommendations  SNF;Supervision/Assistance - 24 hour     Equipment Recommendations       Recommendations for Other Services       Precautions / Restrictions Precautions Precautions: Fall Precaution Comments: Trach; Peg Tube, Watch HR and O2 Restrictions Weight Bearing Restrictions: No    Mobility  Bed Mobility Overal bed mobility: Needs Assistance Bed Mobility: Supine to Sit     Supine to sit: Min assist     General bed mobility comments: Min assist to scoot forward to edge of bed.  Able to support self seated.  Transfers Overall transfer level: Needs assistance Equipment used: Rolling walker (2 wheeled) Transfers: Sit to/from [facility] Sit to Stand: Supervision;+2 safety/equipment Stand pivot transfers: Supervision;Min guard;+2 safety/equipment       General transfer comment: Min guard to stand from bed, min guard to stand. Therapist managed lines and trach tube  Ambulation/Gait Ambulation/Gait assistance: Min guard;Min assist Gait Distance (Feet): 35 Feet Assistive  device: Rolling walker (2 wheeled) Gait Pattern/deviations: Decreased stride length Gait velocity: decreased   General Gait Details: + 2 assist for safety (equipment IV/O2 tank) remained on TRACH collar O2 5 lys at 28% with avg sats 92 but HR increased to 140's with activity. RN in room at time.   Stairs             Wheelchair Mobility    Modified Rankin (Stroke Patients Only)       Balance                                            Cognition Arousal/Alertness: Awake/alert Behavior During Therapy: Flat affect Overall Cognitive Status: Difficult to assess                                 General Comments: Appears functional - limited by trach. Able to follow commands.      Exercises      General Comments        Pertinent Vitals/Pain Pain Assessment: No/denies pain    Home Living                      Prior Function            PT Goals (current goals can now be found in the care plan section) Progress towards PT goals: Progressing toward goals    Frequency    Min 2X/week  PT Plan Current plan remains appropriate    Co-evaluation              AM-PAC PT "6 Clicks" Mobility   Outcome Measure  Help needed turning from your back to your side while in a flat bed without using bedrails?: A Little Help needed moving from lying on your back to sitting on the side of a flat bed without using bedrails?: A Little Help needed moving to and from a bed to a chair (including a wheelchair)?: A Little Help needed standing up from a chair using your arms (e.g., wheelchair or bedside chair)?: A Little Help needed to walk in hospital room?: A Little Help needed climbing 3-5 steps with a railing? : A Lot 6 Click Score: 17    End of Session Equipment Utilized During Treatment: Oxygen Activity Tolerance: Patient tolerated treatment well Patient left: in chair;with call bell/phone within reach Nurse Communication:  Mobility status PT Visit Diagnosis: Unsteadiness on feet (R26.81);Muscle weakness (generalized) (M62.81);Difficulty in walking, not elsewhere classified (R26.2)     Time: 6759-1638 PT Time Calculation (min) (ACUTE ONLY): 31 min  Charges:  $Gait Training: 8-22 mins $Therapeutic Activity: 8-22 mins                     Rica Koyanagi  PTA Acute  Rehabilitation Services Pager      [phone] Office      [phone]

## 2020-06-01 NOTE — Progress Notes (Signed)
Placed PT on new ATC- uneventful. Changed drain sponge and inner cannula- uneventful.. Suctioned PT which resulted in moderate, thick, yellow, tan secretions- uneventful. PT has all emergency items in room at this time based on [facility] guidelines.

## 2020-06-01 NOTE — Plan of Care (Signed)
°  Problem: Clinical Measurements: Goal: Ability to maintain clinical measurements within normal limits will improve Outcome: Progressing Goal: Will remain free from infection Outcome: Progressing Goal: Diagnostic test results will improve Outcome: Progressing Goal: Respiratory complications will improve Outcome: Progressing Goal: Cardiovascular complication will be avoided Outcome: Progressing   Problem: Activity: Goal: Risk for activity intolerance will decrease Outcome: Progressing   Problem: Elimination: Goal: Will not experience complications related to urinary retention Outcome: Progressing   Problem: Pain Managment: Goal: General experience of comfort will improve Outcome: Progressing   Problem: Safety: Goal: Ability to remain free from injury will improve Outcome: Progressing   Problem: Skin Integrity: Goal: Risk for impaired skin integrity will decrease Outcome: Progressing   Problem: Activity: Goal: Ability to tolerate increased activity will improve Outcome: Progressing   Problem: Respiratory: Goal: Ability to maintain a clear airway will improve Outcome: Progressing Goal: Levels of oxygenation will improve Outcome: Progressing Goal: Ability to maintain adequate ventilation will improve Outcome: Progressing

## 2020-06-01 NOTE — Consult Note (Signed)
Radiation Oncology         [phone] ________________________________  Initial inpatient Consultation  Name: [name] MRN: [id number]  Date: 05/21/2020  DOB: 02-29-1941  JI:RCVELF, Terry Harry, MD  No ref. 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 male with locally advanced supraglottic cancer, who was recently admitted and required tracheotomy due to significant stridor and airway obstruction. He has an ECOG PS of 3 and is quite frail.  The patient has been seen by medical oncology and ENT.  CT of neck, personally reviewed by me, shows a bulky supraglottic mass extending to subglottis with suspicious rounded nodes in the bilateral cervical neck.  CT chest shows no obvious metastases.  He cannot have a PET while inpatient.  He is receiving nutrition by PEG.    Sister is present by speaker phone today. Patient cannot speak.  PAST MEDICAL HISTORY:  has a past medical history of Alcohol abuse, Aortic atherosclerosis ([place]), Cardiomyopathy ([place]), COPD (chronic obstructive pulmonary disease) ([place]), Persistent atrial fibrillation ([place]), Seizures ([place]), and Squamous cell carcinoma - supraglottic mass.    PAST SURGICAL HISTORY: Past Surgical History:  Procedure Laterality Date  . DIRECT LARYNGOSCOPY N/A 05/23/2020   Procedure: DIRECT LARYNGOSCOPY with BIOPSY;  Surgeon: Melissa Montane, MD;  Location: [place];  Service: ENT;  Laterality: N/A;  . DIRECT LARYNGOSCOPY N/A 05/23/2020   Procedure: DIRECT LARYNGOSCOPY;  Surgeon: Melissa Montane, MD;  Location: [facility];  Service: ENT;  Laterality: N/A;  . IR GASTROSTOMY TUBE MOD SED   05/28/2020  . TRACHEOSTOMY TUBE PLACEMENT N/A 05/23/2020   Procedure: TRACHEOSTOMY;  Surgeon: Melissa Montane, MD;  Location: [place];  Service: ENT;  Laterality: N/A;  . TRACHEOSTOMY TUBE PLACEMENT N/A 05/23/2020   Procedure: TRACHEOSTOMY;  Surgeon: Melissa Montane, MD;  Location: [facility];  Service: ENT;  Laterality: N/A;    FAMILY HISTORY: family history includes Hypertension in an other family member.  SOCIAL HISTORY:  reports that he has quit smoking. He has never used smokeless tobacco. He reports previous alcohol use of about 6.0 standard drinks of alcohol per week. He reports that he does not use drugs.  ALLERGIES: Patient has no known allergies.  MEDICATIONS:  Current Facility-Administered Medications  Medication Dose Route Frequency Provider Last Rate Last Admin  . 0.9 %  sodium chloride infusion   Intravenous PRN Jonetta Osgood, MD 10 mL/hr at 05/29/20 2330 New Bag at 05/29/20 2330  . acetaminophen (TYLENOL) tablet 650 mg  650 mg Oral Q6H PRN Melissa Montane, MD   650 mg at 05/31/20 2054   Or  . acetaminophen (TYLENOL) suppository 650 mg  650 mg Rectal Q6H PRN Melissa Montane, MD      . chlorhexidine (PERIDEX) 0.12 % solution 15 mL  15 mL Mouth Rinse BID Jonetta Osgood, MD   15 mL at 05/31/20 2054  . dextrose 10 % infusion   Intravenous Continuous Swayze, Ava, DO 30 mL/hr at 05/30/20 1735 Rate Verify at 05/30/20 1735  . diltiazem (CARDIZEM CD) 24 hr capsule 180 mg  180 mg Oral Daily Jerline Pain, MD   180 mg at 05/31/20 1215  . feeding supplement (OSMOLITE  1.2 CAL) liquid 1,000 mL  1,000 mL Per Tube Continuous Swayze, Ava, DO   Stopped at 05/30/20 0654  . feeding supplement (OSMOLITE 1.2 CAL) liquid 1,000 mL  1,000 mL Per Tube Continuous Swayze, Ava, DO   Stopped at 05/31/20 2336  . feeding supplement (PROSource TF) liquid 45 mL  45 mL Per Tube Daily Swayze, Ava, DO   45 mL at 05/31/20 1215  . ipratropium-albuterol (DUONEB) 0.5-2.5 (3) MG/3ML nebulizer solution 3 mL  3 mL Nebulization Q6H PRN  Melissa Montane, MD   3 mL at 05/22/20 0806  . MEDLINE mouth rinse  15 mL Mouth Rinse q12n4p Jonetta Osgood, MD   15 mL at 05/31/20 1600  . metoprolol tartrate (LOPRESSOR) injection 5 mg  5 mg Intravenous Q6H PRN Jerline Pain, MD      . metoprolol tartrate (LOPRESSOR) tablet 25 mg  25 mg Oral BID Sueanne Margarita, MD   25 mg at 05/31/20 2054  . multivitamin with minerals tablet 1 tablet  1 tablet Per Tube Daily Swayze, Ava, DO   1 tablet at 05/31/20 1215  . ondansetron (ZOFRAN) tablet 4 mg  4 mg Oral Q6H PRN Melissa Montane, MD       Or  . ondansetron [facility]) injection 4 mg  4 mg Intravenous Q6H PRN Melissa Montane, MD   4 mg at 05/30/20 6237    REVIEW OF SYSTEMS:  Notable for that above.   PHYSICAL EXAM:  height is 5\' 5"  (1.651 m) and weight is 100 lb 15.5 oz (45.8 kg). His oral temperature is 98.7 F (37.1 C). His blood pressure is 100/62 and his pulse is 65. His respiration is 17 and oxygen saturation is 100%.   General: Alert and oriented, in no acute distress MSK: temporal wasting; frail HEENT:  Oropharynx is notable for no upper throat lesions, no oral lesions. Neck: Neck is notable for no obvious masses. + intact tracheostomy.   ECOG = 3  0 - Asymptomatic (Fully active, able to carry on all predisease activities without restriction)  1 - Symptomatic but completely ambulatory (Restricted in physically strenuous activity but ambulatory and able to carry out work of a light or sedentary nature. For example, light housework, office work)  2 - Symptomatic, <50% in bed during the day (Ambulatory and capable of all self care but unable to carry out any work activities. Up and about more than 50% of waking hours)  3 - Symptomatic, >50% in bed, but not bedbound (Capable of only limited self-care, confined to bed or chair 50% or more of waking hours)  4 - Bedbound (Completely disabled. Cannot carry on any self-care. Totally confined to bed or chair)  5 - Death   Eustace Pen MM, Creech RH, Tormey  DC, et al. [phone]). "Toxicity and response criteria of the [facility]". [place] Oncol. 5 (6): 649-55   LABORATORY DATA:  Lab Results  Component Value Date   WBC 13.4 (H) 06/01/2020   HGB 12.1 (L) 06/01/2020   HCT 35.1 (L) 06/01/2020   MCV 83.2 06/01/2020   PLT 197 06/01/2020   CMP     Component Value Date/Time   NA 137 06/01/2020 0458   K 3.3 (L) 06/01/2020 0458   CL 96 (L) 06/01/2020 0458   CO2 30 06/01/2020 0458   GLUCOSE 133 (H) 06/01/2020 0458   BUN 23 06/01/2020 0458   CREATININE 0.87 06/01/2020 0458   CALCIUM 8.6 (L) 06/01/2020 0458   PROT 5.4 (L)  05/28/2020 0358   ALBUMIN 2.4 (L) 05/28/2020 0358   AST 18 05/28/2020 0358   ALT 15 05/28/2020 0358   ALKPHOS 49 05/28/2020 0358   BILITOT 1.1 05/28/2020 0358   GFRNONAA >60 06/01/2020 0458   GFRAA >60 06/01/2020 0458      Lab Results  Component Value Date   TSH 2.799 07/10/2016     RADIOGRAPHY: DG Abd 1 View  Result Date: 05/30/2020 CLINICAL DATA:  Vomiting. EXAM: ABDOMEN - 1 VIEW COMPARISON:  CT, 07/10/2016 FINDINGS: There are gas-filled loops of mildly distended bowel. There is some contrast within a mostly decompressed descending and sigmoid colon and in the rectum. Gastrostomy tube projects in the left mid to upper abdomen. IMPRESSION: 1. Mild diffuse gaseous distention of bowel, most likely a mild diffuse adynamic ileus. Partial bowel obstruction is not excluded but felt less likely. Electronically Signed   By: Lajean Manes M.D.   On: 05/30/2020 08:58   CT Soft Tissue Neck W Contrast  Result Date: 05/21/2020 CLINICAL DATA:  Right laryngeal mass.  Stridor. EXAM: CT NECK WITH CONTRAST TECHNIQUE: Multidetector CT imaging of the neck was performed using the standard protocol following the bolus administration of intravenous contrast. CONTRAST:  74mL OMNIPAQUE IOHEXOL 300 MG/ML  SOLN COMPARISON:  CT chest 07/10/2016 and 03/25/2020 FINDINGS: Pharynx and larynx: Nasopharynx is clear. Soft palate  is within normal limits. Tongue base and palatine tonsils are normal. The epiglottis is within normal limits. Soft tissue tumor fills the right area epiglottic fold. Tumor measures 3.3 x 3.7 x 3.0 cm. Tumor crosses midline posteriorly. Tumor extends at least 5 mm below the right vocal cord. Lower trachea is within normal limits. Salivary glands: Submandibular and parotid glands and ducts are within normal limits. Thyroid: Normal Lymph nodes: Right level 2 rounded nodes measuring 7 and 8 mm raise some concern for malignancy. Rounded posterior left level 3 lymph nodes are present. Vascular: Atherosclerotic changes are present at the carotid bifurcations bilaterally without significant stenosis. Additional calcifications are present at the origin of the left subclavian artery without significant stenosis. Limited intracranial: Negative. Visualized orbits: The globes and orbits are within normal limits. Mastoids and visualized paranasal sinuses: The paranasal sinuses and mastoid air cells are clear. Skeleton: Multilevel degenerative changes the cervical spine are most severe at C6-7 and C7-T1. 2 body heights are maintained. The patient is edentulous. Upper chest: Centrilobular emphysematous changes are present. Lung apices are otherwise clear. Thoracic inlet is within normal limits. IMPRESSION: 1. 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 Tumor crosses midline posteriorly and extends at least 5 mm below the right vocal cord. 2. Rounded right level 2 and posterior left level 3 lymph nodes are concerning for malignancy. PET scan of the neck would be useful to evaluate for any malignant nodes. 3. Aortic Atherosclerosis (ICD10-I70.0) and Emphysema (ICD10-J43.9). Electronically Signed   By: San Morelle M.D.   On: 05/21/2020 22:00   CT CHEST W CONTRAST  Result Date: 05/28/2020 CLINICAL DATA:  New diagnosis head and neck cancer, staging EXAM: CT CHEST WITH  CONTRAST TECHNIQUE: Multidetector CT imaging of the chest was performed during intravenous contrast administration. CONTRAST:  13mL OMNIPAQUE IOHEXOL 300 MG/ML  SOLN COMPARISON:  CT neck, 05/21/2020, CT chest, 03/25/2020 FINDINGS: Lower neck: Right-sided supraglottic mass is again noted, better assessed by recent CT of the neck. Cardiovascular: Scattered aortic atherosclerosis. Cardiomegaly. Left coronary artery calcifications. No pericardial effusion. Mediastinum/Nodes: No enlarged mediastinal, hilar, or axillary lymph  nodes. Interval tracheostomy with associated pneumomediastinum and subcutaneous emphysema about the neck. Frothy debris in the lower trachea. Esophagus demonstrates no significant findings. Lungs/Pleura: Mild centrilobular emphysema. Examination of the lung parenchyma is significantly limited by breath motion artifact. Within this limitation, unchanged, bibasilar predominant pattern of pulmonary fibrosis featuring irregular peripheral interstitial opacity, traction bronchiectasis, subpleural bronchiolectasis, and honeycombing at the lung bases. No pleural effusion or pneumothorax. Upper Abdomen: No acute abnormality. Musculoskeletal: No chest wall mass or suspicious bone lesions identified. IMPRESSION: 1. Examination is limited by breath motion artifact. Within this limitation, no evidence of metastatic disease in the chest. 2. Redemonstrated moderate UIP pattern pulmonary fibrosis, in keeping with clinical diagnosis of IPF. 3. Interval tracheostomy with associated pneumomediastinum and subcutaneous emphysema about the neck. 4. Emphysema (ICD10-J43.9). 5. Coronary artery disease. Aortic Atherosclerosis (ICD10-I70.0). Electronically Signed   By: Eddie Candle M.D.   On: 05/28/2020 08:07   IR GASTROSTOMY TUBE MOD SED  Result Date: 05/28/2020 INDICATION: History of head neck cancer. Please place percutaneous gastrostomy tube for enteric nutrition supplementation purposes. EXAM: PULL TROUGH  GASTROSTOMY TUBE PLACEMENT COMPARISON:  Chest CT-05/28/2020; 03/26/2020; CT abdomen and pelvis-07/10/2016 MEDICATIONS: Ancef 2 gm IV; Antibiotics were administered within 1 hour of the procedure. Glucagon 1 mg IV CONTRAST:  50 mL of Omnipaque 300 administered into the gastric lumen. ANESTHESIA/SEDATION: Moderate (conscious) sedation was employed during this procedure. A total of Versed 0.5 mg and Fentanyl 25 mcg was administered intravenously. Moderate Sedation Time: 22 minutes. The patient's level of consciousness and vital signs were monitored continuously by radiology nursing throughout the procedure under my direct supervision. FLUOROSCOPY TIME:  2 minutes, 24 seconds (2 mGy) COMPLICATIONS: None immediate. PROCEDURE: Informed written consent was obtained from the patient following explanation of the procedure, risks, benefits and alternatives. A time out was performed prior to the initiation of the procedure. Ultrasound scanning was performed to demarcate the edge of the left lobe of the liver. Maximal barrier sterile technique utilized including caps, mask, sterile gowns, sterile gloves, large sterile drape, hand hygiene and Betadine prep. The left upper quadrant was sterilely prepped and draped. An oral gastric catheter was inserted into the stomach under fluoroscopy. The existing nasogastric feeding tube was removed. The left costal margin and air opacified transverse colon were identified and avoided. Air was injected into the stomach for insufflation and visualization under fluoroscopy. Under sterile conditions a 17 gauge trocar needle was utilized to access the stomach percutaneously beneath the left subcostal margin after the overlying soft tissues were anesthetized with 1% Lidocaine with epinephrine. Needle position was confirmed within the stomach with aspiration of air and injection of small amount of contrast. A single T tack was deployed for gastropexy. Over an Amplatz guide wire, a 9-French sheath  was inserted into the stomach. A snare device was utilized to capture the oral gastric catheter. The snare device was pulled retrograde from the stomach up the esophagus and out the oropharynx. The 20-French pull-through gastrostomy was connected to the snare device and pulled antegrade through the oropharynx down the esophagus into the stomach and then through the percutaneous tract external to the patient. The gastrostomy was assembled externally. Contrast injection confirms position in the stomach. Several spot radiographic images were obtained in various obliquities for documentation. The patient tolerated procedure well without immediate post procedural complication. FINDINGS: After successful fluoroscopic guided placement, the gastrostomy tube is appropriately positioned with internal disc against the ventral aspect of the gastric lumen. IMPRESSION: Successful fluoroscopic insertion of a 20-French pull-through gastrostomy  tube. The gastrostomy may be used immediately for medication administration and in 24 hrs for the initiation of feeds. Electronically Signed   By: Sandi Mariscal M.D.   On: 05/28/2020 11:06   DG Chest Port 1 View  Result Date: 05/24/2020 CLINICAL DATA:  Leukocytosis EXAM: PORTABLE CHEST 1 VIEW COMPARISON:  May 21, 2020 chest radiograph and chest CT Mar 25, 2020 FINDINGS: Tracheostomy catheter tip is 5.8 cm above the carina. No pneumothorax evident. There is, however, pneumomediastinum as well as subcutaneous air tracking in the supraclavicular regions bilaterally. Fibrotic type changes noted bilaterally, primarily in the mid and lower lung regions. There is no frank edema or airspace opacity. Heart is upper normal in size with pulmonary vascularity normal. No adenopathy. No bone lesions. IMPRESSION: Pneumomediastinum and supraclavicular soft tissue air of uncertain etiology. No pneumothorax evident. No tracheostomy present. Question pneumomediastinum and subcutaneous air secondary to recent  tracheostomy placement. Areas of fibrotic change bilaterally without frank edema or airspace opacity. Stable cardiac silhouette. Electronically Signed   By: Lowella Grip III M.D.   On: 05/24/2020 05:24   DG Chest Port 1 View  Result Date: 05/21/2020 CLINICAL DATA:  Difficulty breathing, COPD, CHF EXAM: PORTABLE CHEST 1 VIEW COMPARISON:  03/04/2020, 03/25/2020 FINDINGS: Single frontal view of the chest demonstrates a stable cardiac silhouette. There are chronic bilateral areas of scarring, with fibrosis greatest at the lung bases left greater than right. No acute airspace disease. No effusion or pneumothorax. No acute bony abnormalities. IMPRESSION: 1. 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 No acute intrathoracic process. Electronically Signed   By: Randa Ngo M.D.   On: 05/21/2020 19:21   DG Swallowing Func-Speech Pathology  Result Date: 05/25/2020 Objective Swallowing Evaluation: Type of Study: MBS-Modified Barium Swallow Study  Patient Details Name: Joeanthony Seeling MRN: [id number] Date of Birth: 10/02/1941 Today's Date: 05/25/2020 Time: SLP Start Time (ACUTE ONLY): 1045 -SLP Stop Time (ACUTE ONLY): 1103 SLP Time Calculation (min) (ACUTE ONLY): 18 min Past Medical History: Past Medical History: Diagnosis Date . Alcohol abuse  . Aortic atherosclerosis ([place])   a. 04/2020 noted on CT. Marland Kitchen Cardiomyopathy ([place])   a. 12/2001 Echo: EF 55-65%; b. 02/2020 Echo: EF 45-50%, glob HK. Mild LVH of the basal segment.  Mildly reduced RV fxn. Nl RVSP. Mild MR. Triv AI. Marland Kitchen COPD (chronic obstructive pulmonary disease) ([place])  . Persistent atrial fibrillation ([place])  . Seizures ([place])  . Squamous cell carcinoma - supraglottic mass   a. 04/2020 CT Head/neck: 3.3 x 3.7 x 3.0 cm R supraglottic mass consistent w/ squamous cell carcinoma. Bilat lymph nodes concerning for malignancy. Past Surgical History: Past Surgical History: Procedure Laterality Date . DIRECT LARYNGOSCOPY N/A 05/23/2020   Procedure: DIRECT LARYNGOSCOPY with BIOPSY;  Surgeon: Melissa Montane, MD;  Location: [place];  Service: ENT;  Laterality: N/A; . TRACHEOSTOMY TUBE PLACEMENT N/A 05/23/2020  Procedure: TRACHEOSTOMY;  Surgeon: Melissa Montane, MD;  Location: [place];  Service: ENT;  Laterality: N/A; HPI:  Patient is a 79 y.o. 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 He has a trach and ENT advised againg Passy-Muir speaking valve presently. His laryngeal elevation and anterior hyoid excurison is decreased  and mass appears to obstruct laryngeal vestibule and in turn appears to prevent greater penetration/aspiration. There was mild-moderate vallecular residue consistently. Minimal amount of barium, seen close to and below vocal folds, possibly entered after the initial swallow. Pt began to have tachycardia, which RN was aware of. Towards end of study SAWT recommended pt go back to room and no strategies were attempted. Recommend regular texture and thin liquids, crushed meds. Therapist was made aware of pt's disposition plans to hospice today.        SLP Visit Diagnosis Dysphagia, pharyngeal phase (R13.13) Attention and concentration deficit following -- Frontal lobe and executive function deficit following -- Impact on safety and function Mild aspiration risk;Moderate aspiration risk   CHL IP TREATMENT RECOMMENDATION 05/25/2020 Treatment Recommendations Other (Comment)   Prognosis 07/12/2016 Prognosis for Safe Diet Advancement Fair Barriers to Reach Goals Cognitive deficits Barriers/Prognosis Comment -- CHL IP DIET  RECOMMENDATION 05/25/2020 SLP Diet Recommendations Regular solids;Thin liquid Liquid Administration via Cup;Straw Medication Administration Crushed with puree Compensations Slow rate;Small sips/bites Postural Changes Seated upright at 90 degrees   CHL IP OTHER RECOMMENDATIONS 05/25/2020 Recommended Consults -- Oral Care Recommendations Oral care BID Other Recommendations --   CHL IP FOLLOW UP RECOMMENDATIONS 05/25/2020 Follow up Recommendations None   CHL IP FREQUENCY AND DURATION 07/12/2016 Speech Therapy Frequency (ACUTE ONLY) min 2x/week Treatment Duration 1 week      CHL IP ORAL PHASE 05/25/2020 Oral Phase WFL Oral - Pudding Teaspoon -- Oral - Pudding Cup -- Oral - Honey Teaspoon -- Oral - Honey Cup -- Oral - Nectar Teaspoon -- Oral - Nectar Cup -- Oral - Nectar Straw -- Oral - Thin Teaspoon -- Oral - Thin Cup -- Oral - Thin Straw -- Oral - Puree -- Oral - Mech Soft -- Oral - Regular -- Oral - Multi-Consistency -- Oral - Pill -- Oral Phase - Comment --  CHL IP PHARYNGEAL PHASE 05/25/2020 Pharyngeal Phase Impaired Pharyngeal- Pudding Teaspoon -- Pharyngeal -- Pharyngeal- Pudding Cup -- Pharyngeal -- Pharyngeal- Honey Teaspoon -- Pharyngeal -- Pharyngeal- Honey Cup -- Pharyngeal -- Pharyngeal- Nectar Teaspoon Delayed swallow initiation-vallecula;Pharyngeal residue - valleculae;Reduced laryngeal elevation;Reduced anterior laryngeal mobility Pharyngeal -- Pharyngeal- Nectar Cup Pharyngeal residue - valleculae;Reduced anterior laryngeal mobility;Reduced laryngeal elevation Pharyngeal -- Pharyngeal- Nectar Straw -- Pharyngeal -- Pharyngeal- Thin Teaspoon -- Pharyngeal -- Pharyngeal- Thin Cup Pharyngeal residue - valleculae;Penetration/Apiration after swallow;Penetration/Aspiration during swallow;Reduced laryngeal elevation;Reduced anterior laryngeal mobility Pharyngeal Material enters airway, passes BELOW cords without attempt by patient to eject out (silent aspiration) Pharyngeal- Thin Straw Pharyngeal residue - valleculae  Pharyngeal -- Pharyngeal- Puree -- Pharyngeal -- Pharyngeal- Mechanical Soft -- Pharyngeal -- Pharyngeal- Regular Pharyngeal residue - valleculae Pharyngeal -- Pharyngeal- Multi-consistency -- Pharyngeal -- Pharyngeal- Pill -- Pharyngeal -- Pharyngeal Comment --  CHL IP CERVICAL ESOPHAGEAL PHASE 05/25/2020 Cervical Esophageal Phase (No Data) Pudding Teaspoon -- Pudding Cup -- Honey Teaspoon -- Honey Cup -- Nectar Teaspoon -- Nectar Cup -- Nectar Straw -- Thin Teaspoon -- Thin Cup -- Thin Straw -- Puree -- Mechanical Soft -- Regular -- Multi-consistency -- Pill -- Cervical Esophageal Comment -- [name] 05/25/2020, 1:13 PM                 IMPRESSION/PLAN:  This is a delightful patient with head and neck cancer. I recommend four weeks of definitive hypofractionated radiotherapy for this patient. Goal is cure and local control, but his prognosis is guarded given his performance status and my doubt that he can safely receive chemotherapy concurrently.  The patient, his sister, Harlow Asa RN, and I  discussed the potential risks, benefits, and side effects of radiotherapy. We talked in detail about acute and late effects. We discussed that some of the most bothersome acute effects may be mucositis, dysgeusia, salivary changes, skin irritation, hair loss, dehydration, weight loss and fatigue. We talked about late effects which include but are not necessarily limited to dysphagia, hypothyroidism, nerve injury, spinal cord injury, xerostomia, tissue necrosis, and neck edema. No guarantees of treatment were given. The alternative is hospice/comfort measures only, but we discussed that dying of local throat disease would be quite difficult and morbid. A consent form was signed and placed in the patient's medical record. The patient and sister are enthusiastic about proceeding with treatment. I look forward to participating in the patient's care.    Simulation (treatment planning) will take place  today and we will start RT in a week, to allow time for RT planning and bolstering patient's nutrition in the meantime.  Patient will also be reviewed at ENT board in 2 days.  On date of service, in total, I spent 40 minutes on this encounter. Patient was seen in person.   __________________________________________   Eppie [name], MD

## 2020-06-01 NOTE — Progress Notes (Signed)
  Speech Language Pathology Treatment: Dysphagia  Patient Details Name: [name] MRN: [id number] DOB: 10/08/1941 Today's Date: 06/01/2020 Time: 3762-8315 SLP Time Calculation (min) (ACUTE ONLY): 19 min  Assessment / Plan / Recommendation Clinical Impression  Per review of chart, pt with vomiting of tube feeding again, therefore he has not been receiving nutrition.  Pt is HOH but will answer some questions in context appropriately.  Able to lift left hand to indicate left handed, finally with delay articulated desire for ice not water.  However other questions eg: tube feeding staying down versus vomiting - pt did not answer - ? Baseline cognition.   Pt was seen by SLP in 2017 when he was admitted with ETOH induced seizures and he was able to answer meaningful questions at that time.  SLP questions baseline cognition due to decreased ability to follow directions answer some basic questions during session.  Advised RN to turn off the tv when communicating with pt due to his hearing loss and ? Cognition.   Will trial SLP dysphagia treatment but uncertain to ability to participate.  In the interim, recommend continue ice chips/clears - ? If MD would be comfortable attempting small amounts of clears as pt tolerates.   Informed RN.     HPI HPI: Patient is a 79 y.o. 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  He underwent MBS on 05/25/2020 with recommendation for regular/thin diet - problems with HR occured during study and SWAT team was called.  Pt had been comfort care with plans to dc to hospice, but changed to treatment as goals.  He aspirated tube feeding and was made npo.  Concern for adynamic ileus present - RN reports plan to resume tube feeding "slowly".  SLP follow up to determine readiness for po intake.      SLP Plan   Continue with current plan of care       Recommendations  Diet recommendations:  (clears in very limited amounts vs sips/ice chips) Liquids provided via: Cup Medication Administration: Via alternative means Supervision: Patient able to self feed (full if diet ok) Compensations: Slow rate;Small sips/bites Postural Changes and/or Swallow Maneuvers: Seated upright 90 degrees;Upright 30-60 min after meal                Oral Care Recommendations: Oral care prior to ice chip/H20 SLP Visit Diagnosis: Dysphagia, pharyngeal phase (R13.13) Plan: Continue with current plan of care       GO                Macario Golds 06/01/2020, 2:52 PM  Kathleen Lime, MS [place] Office [phone]

## 2020-06-02 LAB — CBC WITH DIFFERENTIAL/PLATELET
Abs Immature Granulocytes: 0.14 10*3/uL — ABNORMAL HIGH (ref 0.00–0.07)
Basophils Absolute: 0 10*3/uL (ref 0.0–0.1)
Basophils Relative: 0 %
Eosinophils Absolute: 0.5 10*3/uL (ref 0.0–0.5)
Eosinophils Relative: 4 %
HCT: 30.7 % — ABNORMAL LOW (ref 39.0–52.0)
Hemoglobin: 11 g/dL — ABNORMAL LOW (ref 13.0–17.0)
Immature Granulocytes: 1 %
Lymphocytes Relative: 8 %
Lymphs Abs: 1.1 10*3/uL (ref 0.7–4.0)
MCH: 28.9 pg (ref 26.0–34.0)
MCHC: 35.8 g/dL (ref 30.0–36.0)
MCV: 80.8 fL (ref 80.0–100.0)
Monocytes Absolute: 1.3 10*3/uL — ABNORMAL HIGH (ref 0.1–1.0)
Monocytes Relative: 9 %
Neutro Abs: 10.9 10*3/uL — ABNORMAL HIGH (ref 1.7–7.7)
Neutrophils Relative %: 78 %
Platelets: 211 10*3/uL (ref 150–400)
RBC: 3.8 MIL/uL — ABNORMAL LOW (ref 4.22–5.81)
RDW: 13.8 % (ref 11.5–15.5)
WBC: 13.8 10*3/uL — ABNORMAL HIGH (ref 4.0–10.5)
nRBC: 0 % (ref 0.0–0.2)

## 2020-06-02 LAB — BASIC METABOLIC PANEL
Anion gap: 10 (ref 5–15)
BUN: 17 mg/dL (ref 8–23)
CO2: 27 mmol/L (ref 22–32)
Calcium: 8.2 mg/dL — ABNORMAL LOW (ref 8.9–10.3)
Chloride: 96 mmol/L — ABNORMAL LOW (ref 98–111)
Creatinine, Ser: 0.81 mg/dL (ref 0.61–1.24)
GFR calc Af Amer: >60 mL/min (ref >60)
GFR calc non Af Amer: >60 mL/min (ref >60)
Glucose, Bld: 105 mg/dL — ABNORMAL HIGH (ref 70–99)
Potassium: 3.5 mmol/L (ref 3.5–5.1)
Sodium: 133 mmol/L — ABNORMAL LOW (ref 135–145)

## 2020-06-02 LAB — GLUCOSE, CAPILLARY
Glucose-Capillary: 100 mg/dL — ABNORMAL HIGH (ref 70–99)
Glucose-Capillary: 92 mg/dL (ref 70–99)
Glucose-Capillary: 113 mg/dL — ABNORMAL HIGH (ref 70–99)
Glucose-Capillary: 114 mg/dL — ABNORMAL HIGH (ref 70–99)
Glucose-Capillary: 120 mg/dL — ABNORMAL HIGH (ref 70–99)

## 2020-06-02 MED ORDER — APIXABAN 2.5 MG PO TABS
2.5000 mg | ORAL_TABLET | Freq: Two times a day (BID) | ORAL | Status: DC
  Administered 2020-06-02 – 2020-06-03 (×3): 2.5 mg via ORAL
  Filled 2020-06-02 (×3): qty 1

## 2020-06-02 NOTE — Progress Notes (Signed)
PROGRESS NOTE  [name] ZMO:[id number] DOB: Aug 07, 1941 DOA: 05/21/2020 PCP: Elsie Stain, MD  Brief History   Patient is a 79 y.o. male with a history of persistent atrial fibrillation, pulmonary fibrosis, COPD, remote alcohol/tobacco use-who presented with stridor/shortness of breath. 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  He was found to have epiglottic mass. ENT was consulted and he underwent tracheostomy on 7/25. 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 Postoperatively the patient had bleeding around the tracheostomy site.    CT of the chest performed on 05/28/2020 demonstrated no evidence of metastatic disease in the chest. PEG tube was placed on 05/28/2020.  Also on 05/28/2020 due to the patient's inability to take PO medications, he missed doses of his rate limiting medications and went into atrial fibrillation with RVR. After transfer the patient has been evaluated by cardiology. They have started him on Cardizem CD 180 mg that can go down his PEG tube. Heart rate has been well controlled while the patient is at rest, but not with activity.  The patient was transferred to [facility] for the initiation of radiation therapy. Nutrition has been asked to assist with tube feeds. Heart rate has remained intermittently out of control when the patient is active. Cardiology following. Today they have added back metoprolol 50 mg bid in place of the IV metoprolol. Heart rate in the 40's and 50's this afternoon after the change.  The patient had 2 episodes of voluminous emesis yesterday. He had no change in oxygen requirements. X-ray of the abdomen demonstrated an adynamic ileus. Tube feeds were held overnight. He was given IV fluids with dextrose. On the morning of 05/31/2020 tube feeds were restarted at 10 cc/hr to be slowly advanced to goal rate of 40. At the recommendation of  speech, the diet has been upgraded slightly from NPO to sips of water and ice chips. However, the patient has not tolerated this and has started vomiting again. I will check an upper GI with small bowel follow through and start reglan. For the time being he will be managed on d5 1/2 NS.  Subjective   The patient was seen and examined this morning, lethargic, cachectic male, able to open his eyes, participate in exam somewhat.  Obviously unable to communicate due to tracheostomy. Per nursing staff could not tolerate tube feed overnight, was associated with nausea vomiting therefore was stopped  Discussed the patient with team including social worker pharmacy medications reviewed Dietitian will reattempt tube feeds at a slow rate   A & P   Shortness of breath    with stridor secondary to supraglottic tumor:  ENT was consulted. The patient is s/p tracheostomy with laryngeal biopsy on 7/25.  The patient's postoperative course has been complicated by bleeding that has now resolved.   Biopsy shows squamous cell carcinoma.   ENT following-oncology/radiation oncology following.  The patient has been transferred to [facility] for radiology therapy.  Staging CT chest was performed at [facility]. Although it was a poor study, it showed no evidence of metastatic disease. The patient is awaiting radiation therapy.  Leukocytosis: Likely secondary to steroids-afebrile. No overt indications of infection.  -Improving  Persistent A. fib with RVR:  - Cardizem CD 180 mg per PEG as prescribed by cardiology and as needed IV metoprolol. Per ENT (Dr. Marcelline Deist) the patient's anticoagulation must be held for 1 week following 05/23/2020. cardiology has added back metoprolol 50  mg bid.  -Recommended Eliquis to be started... Which will initiate in a.m. -Heart rate is stabilized -I appreciate cardiology's assistance.  Adynamic ileus:  -Patient could not tolerate to food yesterday, associated with nausea  vomiting -Reattempting today Attempted to restart tube feeds at a very slow rate. The patient is still unable to tolerate them. I have started reglan and ordered UGI with SBFT. He will be maintained on D5 1/2 NS.  Dysphagia:  -PEG tube functional, unable to tolerate tube feeds at this time  Secondary to laryngeal mass. As per SLP, recommendations are for a dysphagia 3 diet. This is in anticipation of radiation and required due to ongoing poor oral intake. PEG tube placed by IR on 7/30. PEG has been cleared for use.  Nutrition has placed an order for tube feeds. The patient has been unable to tolerate them even at very slow rate. I have started IV Reglan and ordered UGI with SBFT.    HTN: BP stable. Will continue metoprolol and Cardizem. Monitor.  History of idiopathic pulmonary fibrosis: Appears stable-followed by Dr. Joya Gaskins in the outpatient setting-etiology thought to be recurrent aspiration.  Normocytic anemia: Mild-stable for follow-up.  Debility/deconditioning: Persistent. Per the patient's history, he has been gradually declining in function over the past few months. He is now residing at ALF. PT/OT to evaluate and help with determination of disposition needs.  Palliative care discussion:  -DNR/DNI now -Pending LTAC for discharge  The patient is an 79 year old with numerous medical problems, as outlined above. He now has a supraglottic tumor. He has been discussed with sister over the phone. She realizes that the patient has a long road ahead of him in terms of treatment. She has expressed concern that he may not tolerate treatment for laryngeal cancer.  Appreciate palliative care evaluation. Initially the family wanted to proceed with comfort measures. However, after further discussion, they are now willing to consider radiation/chemotx.   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 Nutrition has been consulted, and the  patient has been started on Ensure Enlive, Magic cup, and MVI. I appreciate nutrition's services. Thhis is greatly complicated by the patient's adynamic ileus and inability to tolerate tube feeds.   I have seen and examined this patient myself. I have spent 42 minutes in his evaluation and care.  DVT Prophylaxis: SCD's Code Status: DNR per palliative care. Family Communication: None available today.  Disposition Plan: Status is: Inpatient  Remains inpatient appropriate because:Inpatient level of care appropriate due to severity of illness  Dispo:             Patient From: ALF             Planned Disposition: Pending to be discharged to [facility] once tolerating tube  feed             Expected discharge date: 05/25/20             Medically stable for discharge: No  Barriers to Discharge: Stridor due to laryngeal mass. The patient is s/p tracheotomy. His postoperative course has been complicated by bleeding. The patient is also needing PEG tube placement and initiation of radiation treatment.         Consultants  . ENT . PCCM  Procedures  . 05/23/2020 - Tracheostomy . 05/23/2020 - Direct Laryngostomy with biopsy of right larynx . 05/28/2020 - PEG tube insertion  Antibiotics   Anti-infectives (From admission, onward)   Start     Dose/Rate  Route Frequency Ordered Stop   05/28/20 1002  ceFAZolin (ANCEF) IVPB 2g/100 mL premix        over 30 Minutes  Continuous PRN 05/28/20 1008 05/28/20 1002     Objective   Vitals:  Vitals:   06/02/20 1111 06/02/20 1205  BP:  (!) 100/54  Pulse: 60 (!) 55  Resp: 16 16  Temp:  97.7 F (36.5 C)  SpO2: 98% 95%   Exam: No significant changes, cachectic, trach and PEG, could not tolerate tube feed yesterday Lethargic in bed Constitutional:  . The patient is awake, alert, and oriented x 1. No acute distress. He appears cachectic, weak, and frail. Respiratory:  . No increased work of breathing. . No wheezes, rales, or rhonchi . No  tactile fremitus . Trach in place.... O2 via trach, functional, Cardiovascular:  . Regular rate and rhythm -stable rhythm . No murmurs, ectopy, or gallups. . No lateral PMI. No thrills. Abdomen:  . Abdomen is soft, non-tender, non-distended . No hernias, masses, or organomegaly . Normoactive bowel sounds.  Musculoskeletal:  . No cyanosis, clubbing, or edema . Lethargic able to withdraw extremities in bed Skin:  . No rashes, lesions, ulcers . palpation of skin: no induration or nodules Neurologic:  . Pt is moving all extremities.- . Limited exam in bed moving all 4 extremities . Lethargic Psychiatric:  . Unable to evaluate as the patient is unable to cooperate with exam.  I have personally reviewed the following:   Today's Data  . Vitals, BMP, CBC, Glucoses  Imaging  . CT chest with contrast  Cardiology Data  . EKG: Atrial fibrillation with RVR.  Scheduled Meds: . apixaban  2.5 mg Oral BID  . chlorhexidine  15 mL Mouth Rinse BID  . diltiazem  180 mg Oral Daily  . feeding supplement (PROSource TF)  45 mL Per Tube Daily  . mouth rinse  15 mL Mouth Rinse q12n4p  . metoCLOPramide (REGLAN) injection  5 mg Intravenous Q6H  . metoprolol tartrate  25 mg Oral BID  . multivitamin with minerals  1 tablet Per Tube Daily   Continuous Infusions: . sodium chloride 10 mL/hr at 05/29/20 2330  . dextrose 30 mL/hr at 06/02/20 0600  . feeding supplement (OSMOLITE 1.2 CAL) Stopped (05/30/20 0654)  . feeding supplement (OSMOLITE 1.2 CAL) Stopped (05/31/20 2336)    Principal Problem:   SOB (shortness of breath) Active Problems:   Atrial fibrillation with RVR (HCC)   IPF (idiopathic pulmonary fibrosis) (HCC)   Stridor   Benign tumor of epiglottis (suprahyoid portion)   Supraglottic mass   Dysphagia   Palliative care by specialist   Goals of care, counseling/discussion   DNR (do not resuscitate) discussion   Cancer of larynx ([place])   Weakness   Tracheostomy status ([place])    Protein-calorie malnutrition, severe   LOS: 12 days

## 2020-06-03 ENCOUNTER — Inpatient Hospital Stay
Admission: AD | Admit: 2020-06-03 | Discharge: 2020-06-30 | Disposition: E | Payer: Medicare Other | Source: Other Acute Inpatient Hospital

## 2020-06-03 DIAGNOSIS — D099 Carcinoma in situ, unspecified: Secondary | ICD-10-CM | POA: Diagnosis present

## 2020-06-03 DIAGNOSIS — K567 Ileus, unspecified: Secondary | ICD-10-CM

## 2020-06-03 DIAGNOSIS — J8410 Pulmonary fibrosis, unspecified: Secondary | ICD-10-CM | POA: Diagnosis present

## 2020-06-03 DIAGNOSIS — I509 Heart failure, unspecified: Secondary | ICD-10-CM

## 2020-06-03 DIAGNOSIS — Z452 Encounter for adjustment and management of vascular access device: Secondary | ICD-10-CM

## 2020-06-03 DIAGNOSIS — J449 Chronic obstructive pulmonary disease, unspecified: Secondary | ICD-10-CM | POA: Diagnosis present

## 2020-06-03 DIAGNOSIS — J9690 Respiratory failure, unspecified, unspecified whether with hypoxia or hypercapnia: Secondary | ICD-10-CM

## 2020-06-03 DIAGNOSIS — I482 Chronic atrial fibrillation, unspecified: Secondary | ICD-10-CM | POA: Diagnosis present

## 2020-06-03 DIAGNOSIS — J9621 Acute and chronic respiratory failure with hypoxia: Secondary | ICD-10-CM | POA: Diagnosis present

## 2020-06-03 HISTORY — DX: Acute and chronic respiratory failure with hypoxia: J96.21

## 2020-06-03 HISTORY — DX: Chronic atrial fibrillation, unspecified: I48.20

## 2020-06-03 HISTORY — DX: Chronic obstructive pulmonary disease, unspecified: J44.9

## 2020-06-03 HISTORY — DX: Pulmonary fibrosis, unspecified: J84.10

## 2020-06-03 LAB — GLUCOSE, CAPILLARY
Glucose-Capillary: 100 mg/dL — ABNORMAL HIGH (ref 70–99)
Glucose-Capillary: 106 mg/dL — ABNORMAL HIGH (ref 70–99)
Glucose-Capillary: 112 mg/dL — ABNORMAL HIGH (ref 70–99)
Glucose-Capillary: 95 mg/dL (ref 70–99)

## 2020-06-03 LAB — BASIC METABOLIC PANEL
Anion gap: 7 (ref 5–15)
BUN: 12 mg/dL (ref 8–23)
CO2: 26 mmol/L (ref 22–32)
Calcium: 7.8 mg/dL — ABNORMAL LOW (ref 8.9–10.3)
Chloride: 96 mmol/L — ABNORMAL LOW (ref 98–111)
Creatinine, Ser: 0.78 mg/dL (ref 0.61–1.24)
GFR calc Af Amer: >60 mL/min (ref >60)
GFR calc non Af Amer: >60 mL/min (ref >60)
Glucose, Bld: 119 mg/dL — ABNORMAL HIGH (ref 70–99)
Potassium: 3 mmol/L — ABNORMAL LOW (ref 3.5–5.1)
Sodium: 129 mmol/L — ABNORMAL LOW (ref 135–145)

## 2020-06-03 LAB — CBC
HCT: 30 % — ABNORMAL LOW (ref 39.0–52.0)
Hemoglobin: 10.5 g/dL — ABNORMAL LOW (ref 13.0–17.0)
MCH: 28.8 pg (ref 26.0–34.0)
MCHC: 35 g/dL (ref 30.0–36.0)
MCV: 82.2 fL (ref 80.0–100.0)
Platelets: 180 10*3/uL (ref 150–400)
RBC: 3.65 MIL/uL — ABNORMAL LOW (ref 4.22–5.81)
RDW: 13.7 % (ref 11.5–15.5)
WBC: 11.2 10*3/uL — ABNORMAL HIGH (ref 4.0–10.5)
nRBC: 0 % (ref 0.0–0.2)

## 2020-06-03 LAB — MAGNESIUM: Magnesium: 2 mg/dL (ref 1.7–2.4)

## 2020-06-03 MED ORDER — ONDANSETRON HCL 4 MG PO TABS: 4.0000 mg | ORAL_TABLET | Freq: Four times a day (QID) | ORAL | 0 refills | Status: AC | PRN

## 2020-06-03 MED ORDER — METOCLOPRAMIDE HCL 5 MG/ML IJ SOLN: 5.0000 mg | Freq: Four times a day (QID) | INTRAMUSCULAR | 0 refills | Status: AC

## 2020-06-03 MED ORDER — POTASSIUM CHLORIDE 10 MEQ/100ML IV SOLN
10.0000 meq | INTRAVENOUS | Status: AC
  Administered 2020-06-03 (×4): 10 meq via INTRAVENOUS
  Filled 2020-06-03 (×4): qty 100

## 2020-06-03 MED ORDER — MAGNESIUM SULFATE 2 GM/50ML IV SOLN
2.0000 g | Freq: Once | INTRAVENOUS | Status: AC
  Administered 2020-06-03: 2 g via INTRAVENOUS
  Filled 2020-06-03: qty 50

## 2020-06-03 MED ORDER — DILTIAZEM HCL ER COATED BEADS 120 MG PO CP24: 120.0000 mg | ORAL_CAPSULE | Freq: Every day | ORAL | Status: DC

## 2020-06-03 MED ORDER — METOPROLOL TARTRATE 25 MG PO TABS: 12.5000 mg | ORAL_TABLET | Freq: Two times a day (BID) | ORAL | Status: DC

## 2020-06-03 MED ORDER — CHLORHEXIDINE GLUCONATE 0.12 % MT SOLN: 15.0000 mL | Freq: Two times a day (BID) | OROMUCOSAL | 0 refills | Status: AC

## 2020-06-03 NOTE — TOC Progression Note (Signed)
Transition of Care [facility]) - Progression Note    Patient Details  Name: [name] MRN: [id number] Date of Birth: 1941-04-27  Transition of Care [facility]) CM/SW Contact  Ross Ludwig, [place] Phone Number: 06/29/2020, 9:07 AM  Clinical Narrative:     CSW spoke to [place] at [facility], and she feels patient would be a good candidate for [facility] services.  CSW updated physician, CSW to try to contact patient's sister again.  CSW to continue to follow patient's progress throughout discharge planning.    Expected Discharge Plan: Assisted Living Barriers to Discharge: Continued Medical Work up  Expected Discharge Plan and Services Expected Discharge Plan: Assisted Living                                               Social Determinants of Health (SDOH) Interventions    Readmission Risk Interventions No flowsheet data found.

## 2020-06-03 NOTE — TOC Progression Note (Addendum)
Transition of Care [facility]) - Progression Note    Patient Details  Name: Narvel Kozub MRN: [id number] Date of Birth: Apr 10, 1941  [facility]) CM/SW Contact  Ross Ludwig,  Phone Number: 06/04/2020, 9:08 AM  Clinical Narrative:     CSW spoke to patient's sister Estill Batten [phone] to see if she is interested in [facility].  CSW gave a short overview about LTACH and asked if she is interested in having the LTACH representative speak to her.  Patient's sister said, we want what's best for him to get better.  CSW updated LTACH representative, she spoke to patient's sister, and they are in agreement for [facility] for patient if attending physician is in agreement to having patient go to [facility].  CSW to update physician.  10:00am  Physician is in agreement that patient can go to [facility].  CSW updated Doroteo Bradford at [facility]	[place], she said patient can come anytime today, CSW updated physician.  Expected Discharge Plan: Assisted Living Barriers to Discharge: Continued Medical Work up  Expected Discharge Plan and Services Expected Discharge Plan: Assisted Living                                               Social Determinants of Health (SDOH) Interventions    Readmission Risk Interventions No flowsheet data found.

## 2020-06-03 NOTE — Progress Notes (Signed)
Physical Therapy Treatment Patient Details Name: [name] MRN: [id number] DOB: [birth year] Today's Date: 06/07/2020    History of Present Illness Pt is 79yo male presenting to the ED with increased stridor and SOB, recent history of choking on his food. 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 Received direct laryngoscopy and trach tube placement 05/23/20. Pt had PEG placed 05/28/20.  Plan is to tx to [facility] for chemo/radiation. PMH supraglottic carcinoma/mass, hx seizurse, A-fib, COPD, cardiomyopathy, alcohol abuse    PT Comments    The patient  Assisted from recliner to ambulate x 8', then backed up to bed. Patient is to Dc to [facility] today. Continue PT while in acute care.  Follow Up Recommendations   LTACH     Equipment Recommendations    none   Recommendations for Other Services       Precautions / Restrictions Precautions Precautions: Fall Precaution Comments: Trach; Peg Tube, Watch HR and O2    Mobility  Bed Mobility Overal bed mobility: Needs Assistance Bed Mobility: Sit to Supine     Supine to sit: Min assist Sit to supine: Min guard   General bed mobility comments: able to place legs onto bed and return to supine  Transfers Overall transfer level: Needs assistance Equipment used: Rolling walker (2 wheeled) Transfers: Sit to/from Stand Sit to Stand: +2 safety/equipment;Min assist Stand pivot transfers: +2 safety/equipment;Min assist       General transfer comment: min steady assist from recliner, cues for hand placement.  Ambulation/Gait Ambulation/Gait assistance: Min assist;+2 safety/equipment Gait Distance (Feet): 8 Feet Assistive device: Rolling walker (2 wheeled) Gait Pattern/deviations: Decreased stride length Gait velocity: decreased   General Gait Details: + 2 assist for safety (equipment IV/O2 tank) remained on TRACH collar 28%with  sats 95% HR 100   Stairs             Wheelchair Mobility     Modified Rankin (Stroke Patients Only)       Balance Overall balance assessment: Needs assistance Sitting-balance support: No upper extremity supported;Feet supported Sitting balance-Leahy Scale: Good Sitting balance - Comments: close supervision statically   Standing balance support: Bilateral upper extremity supported Standing balance-Leahy Scale: Fair Standing balance comment: required RW                            Cognition Arousal/Alertness: Awake/alert Behavior During Therapy: WFL for tasks assessed/performed Overall Cognitive Status: Within Functional Limits for tasks assessed                                 General Comments: Appears functional - limited by trach. Able to follow commands.      Exercises      General Comments        Pertinent Vitals/Pain Pain Assessment: No/denies pain    Home Living                      Prior Function            PT Goals (current goals can now be found in the care plan section) Progress towards PT goals: Progressing toward goals    Frequency           PT Plan      Co-evaluation              AM-PAC PT "6 Clicks" Mobility  Outcome Measure                   End of Session               Time: 6063-0160 PT Time Calculation (min) (ACUTE ONLY): 17 min  Charges:  $Gait Training: 8-22 mins                     Tresa Endo PT Acute Rehabilitation Services Pager [phone] Office [phone]    Claretha Cooper 06/24/2020, 12:44 PM

## 2020-06-03 NOTE — Discharge Summary (Signed)
Physician Discharge Summary [facility]    Patient: [name]                   Admit date: 05/21/2020   DOB: 1941-01-01             Discharge date:06/19/2020/11:14 AM [id number]                          PCP: Elsie Stain, MD  Disposition: LTAC  Recommendations for Outpatient Follow-up:   . Follow up: in 1 day  Discharge Condition: Stable   Code Status:   Code Status: DNR  Diet recommendation: Tube feed may be initiated very slowly and advance as tolerated   Discharge Diagnoses:    Principal Problem:   SOB (shortness of breath) Active Problems:   Atrial fibrillation with RVR (HCC)   IPF (idiopathic pulmonary fibrosis) (HCC)   Stridor   Benign tumor of epiglottis (suprahyoid portion)   Supraglottic mass   Dysphagia   Palliative care by specialist   Goals of care, counseling/discussion   DNR (do not resuscitate) discussion   Malignant neoplasm of supraglottis ([place])   Weakness   Tracheostomy status ([place])   Protein-calorie malnutrition, severe   History of Present Illness/ Hospital Course Kathleen Argue Summary:   Brief History   Patient is an 79 y.o.malewith a history of persistent atrial fibrillation, pulmonary fibrosis, COPD, remote alcohol/tobacco use-who presented with stridor/shortness of breath. 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  He was found to have epiglottic mass. ENT was consulted and he underwent tracheostomy on 7/25. 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   CT of the chest performed on 05/28/2020 demonstrated no evidence of metastatic disease in the chest. PEG tube was placed on 05/28/2020.  Also on 05/28/2020 due to the patient's inability to take PO medications, he missed doses of his rate limiting medications and went into atrial fibrillation with  RVR. After transfer the patient has been evaluated by cardiology. They have started him on Cardizem CD 180 mg that can go down his PEG tube. Heart rate has been well controlled while the patient is at rest, but not with activity.  The patient was transferred to [facility] for the initiation of radiation therapy. Nutrition has been asked to assist with tube feeds. Heart rate has remained intermittently out of control when the patient is active. Cardiology following. Today they have added back metoprolol 50 mg bid in place of the IV metoprolol. Heart rate in the 40's and 50's this afternoon after the change.  The patient had 2 episodes of voluminous emesis yesterday. He had no change in oxygen requirements. X-ray of the abdomen demonstrated an adynamic ileus. Tube feeds were held overnight. He was given IV fluids with dextrose. On the morning of 05/31/2020 tube feeds were restarted at 10 cc/hr to be slowly advanced to goal rate of 40. At the recommendation of speech, the diet has been upgraded slightly from NPO to sips of water and ice chips. However, the patient has not tolerated this and has started vomiting again. I will check an upper GI with small bowel follow through and start reglan. For the time being he will be managed on d5 1/2 NS.    Hospital course/detail A&P   Shortness of breath    with stridor secondary to supraglottic tumor:  ENT  was consulted. The patient is s/p tracheostomy with laryngeal biopsy on 7/25.  The patient's postoperative course has been complicated by bleeding that has now resolved.  Biopsy shows squamous cell carcinoma.  ENT following-oncology/radiation oncology following.  The patient has been transferred to [facility] for radiology therapy. Staging CT chest was performed at [facility]. Although it was a poor study, it showed no evidence of metastatic disease. The patient is awaiting radiation therapy.  Leukocytosis:Likely secondary to steroids-afebrile. No  overt indications of infection.  -Improving  Persistent A. fib with RVR: - Cardizem CD 120 mg per PEG as prescribed by cardiology and as needed IV metoprolol. Per ENT (Dr. Marcelline Deist) the patient's anticoagulation must be held for 1 week following 05/23/2020. cardiology has added back metoprolol 12.5 mg bid.  -Started Eliquis. -Cardiology was following closely-signed off.  Adynamic ileus:  -Patient could not tolerate to food yesterday, associated with nausea vomiting -Reattempting today Attempted to restart tube feeds at a very slow rate. The patient is still unable to tolerate them. I have started reglan and ordered UGI with SBFT. He will be maintained on D5 1/2 NS.  Dysphagia: -PEG tube functional, unable to tolerate tube feeds at this time  Secondary to laryngeal mass. As per SLP, recommendations are for a dysphagia 3 diet. This is in anticipation of radiation and required due to ongoing poor oral intake. PEG tube placed by IR on 7/30. PEG has been cleared for use.  Nutrition has placed an order for tube feeds. The patient has been unable to tolerate them even at very slow rate. I have started IV Reglan and ordered UGI with SBFT.   HTN: BP stable. Will continue metoprolol and Cardizem. Monitor.  History of idiopathic pulmonary fibrosis:Appears stable-followed by Dr. Joya Gaskins in the outpatient setting-etiology thought to be recurrent aspiration.  Normocytic anemia: Mild-stable for follow-up.  Debility/deconditioning:Persistent. Per the patient's history, he has been gradually declining in function over the past few months. He is now residing at ALF. PT/OT to evaluate and help with determination of disposition needs.  Palliative care discussion:  -DNR/DNI now -Pending LTAC for discharge  The patient is an 79 year old with numerous medical problems, as outlined above. He now has a supraglottic tumor. He has been discussed with sister over the phone. She realizes that the  patient has a long road ahead of him in terms of treatment. She has expressed concern that he may not tolerate treatment for laryngeal cancer. Appreciate palliative care evaluation. Initially the family wanted to proceed with comfort measures. However, after further discussion, they are now willing to consider radiation/chemotx.   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 Nutrition has been consulted, and the patient has been started on Ensure Enlive, Magic cup, and MVI. I appreciate nutrition's services. Thhis is greatly complicated by the patient's adynamic ileus and inability to tolerate tube feeds.   I have seen and examined this patient myself. I have spent 42 minutes in his evaluation and care.  DVT Prophylaxis: SCD's Code Status: DNR per palliative care. Family Communication: None available today.  Disposition Plan: Status is: Inpatient  Remains inpatient appropriate because:Inpatient level of care appropriate due to severity of illness  Dispo: Patient From: ALF   Nutritional status:  Nutrition Problem: Severe Malnutrition Etiology: chronic illness (supraglottic squamous cell carcinoma) Signs/Symptoms: percent weight loss, energy intake < or equal to 75% for > or equal to 1 month, severe muscle depletion, severe fat depletion Percent weight loss:  20 % Interventions: Ensure Enlive (each supplement provides 350kcal and 20 grams of protein), Magic cup, MVI   Discharge Instructions:   Discharge Instructions    Activity as tolerated - No restrictions   Complete by: As directed    Change dressing (specify)   Complete by: As directed    Dressing change: Every 24-48 hours, wound care consultation recommended for further evaluation and recommendation. Patient needs to be repositioned every 2-hours   Discharge instructions   Complete by: As directed    Need to dietitian, needs to resume tube feeds very  slowly patient is very intolerable to high flow tube feeds, Patient has episodes of tachycardia then bradycardia medication has been adjusted needs to be monitored Cardizem and beta-blocker dose may need to be titrated Monitor electrolytes closely, potassium and magnesium was replaced today.   Increase activity slowly   Complete by: As directed        Medication List    TAKE these medications   acetaminophen 325 MG tablet Commonly known as: TYLENOL Take 2 tablets (650 mg total) by mouth every 6 (six) hours as needed for mild pain (or Fever >/= 101).   chlorhexidine 0.12 % solution Commonly known as: PERIDEX 15 mLs by Mouth Rinse route 2 (two) times daily.   diltiazem 120 MG 24 hr capsule Commonly known as: Cardizem CD Take 1 capsule (120 mg total) by mouth daily. Hold for HR less than 55 or BP less than 100/60   Eliquis 2.5 MG Tabs tablet Generic drug: apixaban TAKE 1 TABLET BY MOUTH TWICE A DAY.   feeding supplement (OSMOLITE 1.2 CAL) Liqd Place 1,000 mLs into feeding tube continuous.   feeding supplement (OSMOLITE 1.2 CAL) Liqd Place 1,000 mLs into feeding tube continuous.   feeding supplement (PROSource TF) liquid Place 45 mLs into feeding tube daily. Start taking on: June 04, 2020   ipratropium-albuterol 0.5-2.5 (3) MG/3ML Soln Commonly known as: DUONEB Take 3 mLs by nebulization every 6 (six) hours as needed (sob/wheezing).   metoCLOPramide 5 MG/ML injection Commonly known as: REGLAN Inject 1 mL (5 mg total) into the vein every 6 (six) hours.   metoprolol tartrate 25 MG tablet Commonly known as: LOPRESSOR Take 0.5 tablets (12.5 mg total) by mouth 2 (two) times daily. Start taking on: June 04, 2020   mouth rinse Liqd solution 15 mLs by Mouth Rinse route 2 times daily at 12 noon and 4 pm.   multivitamin with minerals Tabs tablet Place 1 tablet into feeding tube daily. Start taking on: June 04, 2020   ondansetron 4 MG tablet Commonly known as:  ZOFRAN Take 1 tablet (4 mg total) by mouth every 6 (six) hours as needed for nausea.   Symbicort 160-4.5 MCG/ACT inhaler Generic drug: budesonide-formoterol INHALE 2 PUFFS INTO THE LUNGS IN THE MORNING AND AT BEDTIME   Ventolin HFA 108 (90 Base) MCG/ACT inhaler Generic drug: albuterol INHALE 2 PUFFS INTO THE LUNGS EVERY 6 HOURS AS NEEDED FOR WHEEZING OR SHORTNESS OF BREATH. What changed: See the new instructions.       Follow-up Information    Croitoru, Mihai, MD. Call on 07/08/2020.   Specialty: Cardiology Why: with his Nurse Practitioner Coletta Memos  at 1045 AM, please arrive at 10:30 AM    Contact information: [address] [place] [address] [phone]              No Known Allergies   Procedures /Studies:   DG Abd 1 View  Result Date: 05/30/2020  CLINICAL DATA:  Vomiting. EXAM: ABDOMEN - 1 VIEW COMPARISON:  CT, 07/10/2016 FINDINGS: There are gas-filled loops of mildly distended bowel. There is some contrast within a mostly decompressed descending and sigmoid colon and in the rectum. Gastrostomy tube projects in the left mid to upper abdomen. IMPRESSION: 1. Mild diffuse gaseous distention of bowel, most likely a mild diffuse adynamic ileus. Partial bowel obstruction is not excluded but felt less likely. Electronically Signed   By: Lajean Manes M.D.   On: 05/30/2020 08:58   CT Soft Tissue Neck W Contrast  Result Date: 05/21/2020 CLINICAL DATA:  Right laryngeal mass.  Stridor. EXAM: CT NECK WITH CONTRAST TECHNIQUE: Multidetector CT imaging of the neck was performed using the standard protocol following the bolus administration of intravenous contrast. CONTRAST:  59mL OMNIPAQUE IOHEXOL 300 MG/ML  SOLN COMPARISON:  CT chest 07/10/2016 and 03/25/2020 FINDINGS: Pharynx and larynx: Nasopharynx is clear. Soft palate is within normal limits. Tongue base and palatine tonsils are normal. The epiglottis is within normal limits. Soft tissue tumor fills the right  area epiglottic fold. Tumor measures 3.3 x 3.7 x 3.0 cm. Tumor crosses midline posteriorly. Tumor extends at least 5 mm below the right vocal cord. Lower trachea is within normal limits. Salivary glands: Submandibular and parotid glands and ducts are within normal limits. Thyroid: Normal Lymph nodes: Right level 2 rounded nodes measuring 7 and 8 mm raise some concern for malignancy. Rounded posterior left level 3 lymph nodes are present. Vascular: Atherosclerotic changes are present at the carotid bifurcations bilaterally without significant stenosis. Additional calcifications are present at the origin of the left subclavian artery without significant stenosis. Limited intracranial: Negative. Visualized orbits: The globes and orbits are within normal limits. Mastoids and visualized paranasal sinuses: The paranasal sinuses and mastoid air cells are clear. Skeleton: Multilevel degenerative changes the cervical spine are most severe at C6-7 and C7-T1. 2 body heights are maintained. The patient is edentulous. Upper chest: Centrilobular emphysematous changes are present. Lung apices are otherwise clear. Thoracic inlet is within normal limits. IMPRESSION: 1. 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 Tumor crosses midline posteriorly and extends at least 5 mm below the right vocal cord. 2. Rounded right level 2 and posterior left level 3 lymph nodes are concerning for malignancy. PET scan of the neck would be useful to evaluate for any malignant nodes. 3. Aortic Atherosclerosis (ICD10-I70.0) and Emphysema (ICD10-J43.9). Electronically Signed   By: San Morelle M.D.   On: 05/21/2020 22:00   CT CHEST W CONTRAST  Result Date: 05/28/2020 CLINICAL DATA:  New diagnosis head and neck cancer, staging EXAM: CT CHEST WITH CONTRAST TECHNIQUE: Multidetector CT imaging of the chest was performed during intravenous contrast administration. CONTRAST:  58mL OMNIPAQUE IOHEXOL  300 MG/ML  SOLN COMPARISON:  CT neck, 05/21/2020, CT chest, 03/25/2020 FINDINGS: Lower neck: Right-sided supraglottic mass is again noted, better assessed by recent CT of the neck. Cardiovascular: Scattered aortic atherosclerosis. Cardiomegaly. Left coronary artery calcifications. No pericardial effusion. Mediastinum/Nodes: No enlarged mediastinal, hilar, or axillary lymph nodes. Interval tracheostomy with associated pneumomediastinum and subcutaneous emphysema about the neck. Frothy debris in the lower trachea. Esophagus demonstrates no significant findings. Lungs/Pleura: Mild centrilobular emphysema. Examination of the lung parenchyma is significantly limited by breath motion artifact. Within this limitation, unchanged, bibasilar predominant pattern of pulmonary fibrosis featuring irregular peripheral interstitial opacity, traction bronchiectasis, subpleural bronchiolectasis, and honeycombing at the lung bases. No pleural effusion or pneumothorax. Upper Abdomen: No acute abnormality.  Musculoskeletal: No chest wall mass or suspicious bone lesions identified. IMPRESSION: 1. Examination is limited by breath motion artifact. Within this limitation, no evidence of metastatic disease in the chest. 2. Redemonstrated moderate UIP pattern pulmonary fibrosis, in keeping with clinical diagnosis of IPF. 3. Interval tracheostomy with associated pneumomediastinum and subcutaneous emphysema about the neck. 4. Emphysema (ICD10-J43.9). 5. Coronary artery disease. Aortic Atherosclerosis (ICD10-I70.0). Electronically Signed   By: Eddie Candle M.D.   On: 05/28/2020 08:07   IR GASTROSTOMY TUBE MOD SED  Result Date: 05/28/2020 INDICATION: History of head neck cancer. Please place percutaneous gastrostomy tube for enteric nutrition supplementation purposes. EXAM: PULL TROUGH GASTROSTOMY TUBE PLACEMENT COMPARISON:  Chest CT-05/28/2020; 03/26/2020; CT abdomen and pelvis-07/10/2016 MEDICATIONS: Ancef 2 gm IV; Antibiotics were  administered within 1 hour of the procedure. Glucagon 1 mg IV CONTRAST:  50 mL of Omnipaque 300 administered into the gastric lumen. ANESTHESIA/SEDATION: Moderate (conscious) sedation was employed during this procedure. A total of Versed 0.5 mg and Fentanyl 25 mcg was administered intravenously. Moderate Sedation Time: 22 minutes. The patient's level of consciousness and vital signs were monitored continuously by radiology nursing throughout the procedure under my direct supervision. FLUOROSCOPY TIME:  2 minutes, 24 seconds (2 mGy) COMPLICATIONS: None immediate. PROCEDURE: Informed written consent was obtained from the patient following explanation of the procedure, risks, benefits and alternatives. A time out was performed prior to the initiation of the procedure. Ultrasound scanning was performed to demarcate the edge of the left lobe of the liver. Maximal barrier sterile technique utilized including caps, mask, sterile gowns, sterile gloves, large sterile drape, hand hygiene and Betadine prep. The left upper quadrant was sterilely prepped and draped. An oral gastric catheter was inserted into the stomach under fluoroscopy. The existing nasogastric feeding tube was removed. The left costal margin and air opacified transverse colon were identified and avoided. Air was injected into the stomach for insufflation and visualization under fluoroscopy. Under sterile conditions a 17 gauge trocar needle was utilized to access the stomach percutaneously beneath the left subcostal margin after the overlying soft tissues were anesthetized with 1% Lidocaine with epinephrine. Needle position was confirmed within the stomach with aspiration of air and injection of small amount of contrast. A single T tack was deployed for gastropexy. Over an Amplatz guide wire, a 9-French sheath was inserted into the stomach. A snare device was utilized to capture the oral gastric catheter. The snare device was pulled retrograde from the stomach  up the esophagus and out the oropharynx. The 20-French pull-through gastrostomy was connected to the snare device and pulled antegrade through the oropharynx down the esophagus into the stomach and then through the percutaneous tract external to the patient. The gastrostomy was assembled externally. Contrast injection confirms position in the stomach. Several spot radiographic images were obtained in various obliquities for documentation. The patient tolerated procedure well without immediate post procedural complication. FINDINGS: After successful fluoroscopic guided placement, the gastrostomy tube is appropriately positioned with internal disc against the ventral aspect of the gastric lumen. IMPRESSION: Successful fluoroscopic insertion of a 20-French pull-through gastrostomy tube. The gastrostomy may be used immediately for medication administration and in 24 hrs for the initiation of feeds. Electronically Signed   By: Sandi Mariscal M.D.   On: 05/28/2020 11:06   DG Chest Port 1 View  Result Date: 05/24/2020 CLINICAL DATA:  Leukocytosis EXAM: PORTABLE CHEST 1 VIEW COMPARISON:  May 21, 2020 chest radiograph and chest CT Mar 25, 2020 FINDINGS: Tracheostomy catheter tip is 5.8 cm above  the carina. No pneumothorax evident. There is, however, pneumomediastinum as well as subcutaneous air tracking in the supraclavicular regions bilaterally. Fibrotic type changes noted bilaterally, primarily in the mid and lower lung regions. There is no frank edema or airspace opacity. Heart is upper normal in size with pulmonary vascularity normal. No adenopathy. No bone lesions. IMPRESSION: Pneumomediastinum and supraclavicular soft tissue air of uncertain etiology. No pneumothorax evident. No tracheostomy present. Question pneumomediastinum and subcutaneous air secondary to recent tracheostomy placement. Areas of fibrotic change bilaterally without frank edema or airspace opacity. Stable cardiac silhouette. Electronically Signed    By: Lowella Grip III M.D.   On: 05/24/2020 05:24   DG Chest Port 1 View  Result Date: 05/21/2020 CLINICAL DATA:  Difficulty breathing, COPD, CHF EXAM: PORTABLE CHEST 1 VIEW COMPARISON:  03/04/2020, 03/25/2020 FINDINGS: Single frontal view of the chest demonstrates a stable cardiac silhouette. There are chronic bilateral areas of scarring, with fibrosis greatest at the lung bases left greater than right. No acute airspace disease. No effusion or pneumothorax. No acute bony abnormalities. IMPRESSION: 1. 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 No acute intrathoracic process. Electronically Signed   By: Randa Ngo M.D.   On: 05/21/2020 19:21   DG Swallowing Func-Speech Pathology  Result Date: 05/25/2020 Objective Swallowing Evaluation: Type of Study: MBS-Modified Barium Swallow Study  Patient Details Name: Braiden Rodman MRN: [id number] Date of Birth: 1941-07-30 Today's Date: 05/25/2020 Time: SLP Start Time (ACUTE ONLY): 1045 -SLP Stop Time (ACUTE ONLY): 1103 SLP Time Calculation (min) (ACUTE ONLY): 18 min Past Medical History: Past Medical History: Diagnosis Date . Alcohol abuse  . Aortic atherosclerosis ([place])   a. 04/2020 noted on CT. Marland Kitchen Cardiomyopathy ([place])   a. 12/2001 Echo: EF 55-65%; b. 02/2020 Echo: EF 45-50%, glob HK. Mild LVH of the basal segment.  Mildly reduced RV fxn. Nl RVSP. Mild MR. Triv AI. Marland Kitchen COPD (chronic obstructive pulmonary disease) ([place])  . Persistent atrial fibrillation ([place])  . Seizures ([place])  . Squamous cell carcinoma - supraglottic mass   a. 04/2020 CT Head/neck: 3.3 x 3.7 x 3.0 cm R supraglottic mass consistent w/ squamous cell carcinoma. Bilat lymph nodes concerning for malignancy. Past Surgical History: Past Surgical History: Procedure Laterality Date . DIRECT LARYNGOSCOPY N/A 05/23/2020  Procedure: DIRECT LARYNGOSCOPY with BIOPSY;  Surgeon: Melissa Montane, MD;  Location: [facility];  Service: ENT;  Laterality: N/A; . TRACHEOSTOMY TUBE PLACEMENT N/A  05/23/2020  Procedure: TRACHEOSTOMY;  Surgeon: Melissa Montane, MD;  Location: [facility];  Service: ENT;  Laterality: N/A; HPI:  Patient is a 79 y.o. 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 He has a trach and ENT advised againg Passy-Muir speaking valve presently. His laryngeal elevation and anterior hyoid excurison is decreased and mass appears to obstruct laryngeal vestibule and in turn appears to prevent greater penetration/aspiration. There was mild-moderate vallecular residue consistently. Minimal amount of barium, seen close to and below vocal folds, possibly entered after the initial swallow. Pt began to have tachycardia, which RN was aware of. Towards end of study SAWT recommended pt go back to room and no strategies were attempted. Recommend regular texture and thin liquids, crushed meds. Therapist was  made aware of pt's disposition plans to hospice today.        SLP Visit Diagnosis Dysphagia, pharyngeal phase (R13.13) Attention and concentration deficit following -- Frontal lobe and executive function deficit following -- Impact on safety and function Mild aspiration risk;Moderate aspiration risk   CHL IP TREATMENT RECOMMENDATION 05/25/2020 Treatment Recommendations Other (Comment)   Prognosis 07/12/2016 Prognosis for Safe Diet Advancement Fair Barriers to Reach Goals Cognitive deficits Barriers/Prognosis Comment -- CHL IP DIET RECOMMENDATION 05/25/2020 SLP Diet Recommendations Regular solids;Thin liquid Liquid Administration via Cup;Straw Medication Administration Crushed with puree  Compensations Slow rate;Small sips/bites Postural Changes Seated upright at 90 degrees   CHL IP OTHER RECOMMENDATIONS 05/25/2020 Recommended Consults -- Oral Care Recommendations Oral care BID Other Recommendations --   CHL IP FOLLOW UP RECOMMENDATIONS 05/25/2020 Follow up Recommendations None   CHL IP FREQUENCY AND DURATION 07/12/2016 Speech Therapy Frequency (ACUTE ONLY) min 2x/week Treatment Duration 1 week      CHL IP ORAL PHASE 05/25/2020 Oral Phase WFL Oral - Pudding Teaspoon -- Oral - Pudding Cup -- Oral - Honey Teaspoon -- Oral - Honey Cup -- Oral - Nectar Teaspoon -- Oral - Nectar Cup -- Oral - Nectar Straw -- Oral - Thin Teaspoon -- Oral - Thin Cup -- Oral - Thin Straw -- Oral - Puree -- Oral - Mech Soft -- Oral - Regular -- Oral - Multi-Consistency -- Oral - Pill -- Oral Phase - Comment --  CHL IP PHARYNGEAL PHASE 05/25/2020 Pharyngeal Phase Impaired Pharyngeal- Pudding Teaspoon -- Pharyngeal -- Pharyngeal- Pudding Cup -- Pharyngeal -- Pharyngeal- Honey Teaspoon -- Pharyngeal -- Pharyngeal- Honey Cup -- Pharyngeal -- Pharyngeal- Nectar Teaspoon Delayed swallow initiation-vallecula;Pharyngeal residue - valleculae;Reduced laryngeal elevation;Reduced anterior laryngeal mobility Pharyngeal -- Pharyngeal- Nectar Cup Pharyngeal residue - valleculae;Reduced anterior laryngeal mobility;Reduced laryngeal elevation Pharyngeal -- Pharyngeal- Nectar Straw -- Pharyngeal -- Pharyngeal- Thin Teaspoon -- Pharyngeal -- Pharyngeal- Thin Cup Pharyngeal residue - valleculae;Penetration/Apiration after swallow;Penetration/Aspiration during swallow;Reduced laryngeal elevation;Reduced anterior laryngeal mobility Pharyngeal Material enters airway, passes BELOW cords without attempt by patient to eject out (silent aspiration) Pharyngeal- Thin Straw Pharyngeal residue - valleculae Pharyngeal -- Pharyngeal- Puree -- Pharyngeal -- Pharyngeal- Mechanical Soft -- Pharyngeal -- Pharyngeal- Regular Pharyngeal residue - valleculae Pharyngeal  -- Pharyngeal- Multi-consistency -- Pharyngeal -- Pharyngeal- Pill -- Pharyngeal -- Pharyngeal Comment --  CHL IP CERVICAL ESOPHAGEAL PHASE 05/25/2020 Cervical Esophageal Phase (No Data) Pudding Teaspoon -- Pudding Cup -- Honey Teaspoon -- Honey Cup -- Nectar Teaspoon -- Nectar Cup -- Nectar Straw -- Thin Teaspoon -- Thin Cup -- Thin Straw -- Puree -- Mechanical Soft -- Regular -- Multi-consistency -- Pill -- Cervical Esophageal Comment -- [name] 05/25/2020, 1:13 PM                Subjective:   Patient was seen and examined 06/23/2020, 11:14 AM Patient stable today. No acute distress.  No issues overnight Stable for discharge.  Discharge Exam:    Vitals:   06/02/20 2018 06/26/2020 0244 06/06/2020 0535 06/27/2020 1047  BP: 105/72  (!) 94/50 (!) 102/52  Pulse: 76 (!) 50 68 75  Resp: 19  19   Temp: 98.4 F (36.9 C)  97.8 F (36.6 C)   TempSrc: Oral  Oral   SpO2: 98% 96% 99%   Weight:      Height:        General: Severe generalized cachexia, but awake alert pt lying comfortably in bed & appears in no obvious distress, nonverbal with tracheostomy, O2 via trach  collar Cardiovascular: S1 & S2 heard, RRR, S1/S2 +. No murmurs, rubs, gallops or clicks. No JVD or pedal edema. Respiratory: Trach in place, functional clear to auscultation without wheezing, rhonchi or crackles. No increased work of breathing. Abdominal: PEG tube in place functional non-distended, non-tender & soft. No organomegaly or masses appreciated. Normal bowel sounds heard. CNS: Alert and oriented. No focal deficits. Extremities: no edema, no cyanosis    The results of significant diagnostics from this hospitalization (including imaging, microbiology, ancillary and laboratory) are listed below for reference.      Microbiology:   No results found for this or any previous visit (from the past 240 hour(s)).   Labs:   CBC: Recent Labs  Lab 05/30/20 0445 05/31/20 0451 06/01/20 0458 06/02/20 0501  06/19/2020 0438  WBC 16.6* 17.8* 13.4* 13.8* 11.2*  NEUTROABS 14.5* 15.4* 11.0* 10.9*  --   HGB 12.0* 11.6* 12.1* 11.0* 10.5*  HCT 34.1* 33.2* 35.1* 30.7* 30.0*  MCV 82.0 82.4 83.2 80.8 82.2  PLT 198 211 197 211 784   Basic Metabolic Panel: Recent Labs  Lab 05/30/20 0445 05/31/20 0451 06/01/20 0458 06/02/20 0501 06/20/2020 0438  NA 137 136 137 133* 129*  K 3.7 3.5 3.3* 3.5 3.0*  CL 100 95* 96* 96* 96*  CO2 26 28 30 27 26   GLUCOSE 146* 142* 133* 105* 119*  BUN 22 27* 23 17 12   CREATININE 0.65 0.88 0.87 0.81 0.78  CALCIUM 8.4* 8.6* 8.6* 8.2* 7.8*  MG  --   --   --   --  2.0   Liver Function Tests: Recent Labs  Lab 05/28/20 0358  AST 18  ALT 15  ALKPHOS 49  BILITOT 1.1  PROT 5.4*  ALBUMIN 2.4*   BNP (last 3 results) Recent Labs    05/21/20 1858  BNP 284.5*   Cardiac Enzymes: No results for input(s): CKTOTAL, CKMB, CKMBINDEX, TROPONINI in the last 168 hours. CBG: Recent Labs  Lab 06/02/20 1615 06/02/20 2000 06/24/2020 0050 06/02/2020 0531 06/06/2020 0733  GLUCAP 114* 120* 100* 106* 112*   Hgb A1c No results for input(s): HGBA1C in the last 72 hours. Lipid Profile No results for input(s): CHOL, HDL, LDLCALC, TRIG, CHOLHDL, LDLDIRECT in the last 72 hours. Thyroid function studies No results for input(s): TSH, T4TOTAL, T3FREE, THYROIDAB in the last 72 hours.  Invalid input(s): FREET3 Anemia work up No results for input(s): VITAMINB12, FOLATE, FERRITIN, TIBC, IRON, RETICCTPCT in the last 72 hours. Urinalysis    Component Value Date/Time   COLORURINE YELLOW 07/14/2015 1352   APPEARANCEUR CLEAR 07/14/2015 1352   LABSPEC 1.015 07/14/2015 1352   PHURINE 7.5 07/14/2015 1352   GLUCOSEU NEGATIVE 07/14/2015 1352   HGBUR NEGATIVE 07/14/2015 1352   BILIRUBINUR NEGATIVE 07/14/2015 1352   KETONESUR NEGATIVE 07/14/2015 1352   PROTEINUR NEGATIVE 07/14/2015 1352   UROBILINOGEN 2.0 (H) 07/14/2015 1352   NITRITE NEGATIVE 07/14/2015 1352   LEUKOCYTESUR NEGATIVE 07/14/2015  1352         Time coordinating discharge: Over 45 minutes  SIGNED: Deatra James, MD, FACP, FHM. [facility],  Please use [email or web address] to Page If 7PM-7AM, please contact night-coverage [email or web address], Password [facility] 06/26/2020, 11:14 AM

## 2020-06-03 NOTE — Progress Notes (Addendum)
Nutrition Follow-up  DOCUMENTATION CODES:   Severe malnutrition in context of chronic illness  INTERVENTION:  - recommend PICC placement and initiation of TPN.   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  Patient remains NPO since 7/29. He has G-tube in place and order for Osmolite 1.2 @ 10 with 45 ml ProSource TF once/day. This regimen provides 328 kcal, 24 grams protein.  Notes state that patient has continued to be unable to tolerate TF even at low rate. TF is not infusing. IV reglan has been started. MD notes state patient with adynamic ileus.   Discharge order and discharge summary in today for discharge to LTAC.    Labs reviewed; CBGs: 100, 106, 112, 95 mg/dl, Na: 129 mmol/l, K: 3 mmol/l, Cl: 96 mmol/l, Ca: 7.8 mg/dl. Medications reviewed; 2 g IV Mg sulfate x1 run 8/5, 5 mg IV reglan QID. IVF; D10 @ 30 ml/hr (245 kcal).   Diet Order:   Diet Order            Diet NPO time specified  Diet effective now                 EDUCATION NEEDS:   Not appropriate for education at this time  Skin:  Skin Assessment: Skin Integrity Issues: Skin Integrity Issues:: Incisions, Other (Comment) Incisions: neck Other: G-tube insertion site  Last BM:  8/3  Height:   Ht Readings from Last 1 Encounters:  05/21/20 5' 5" (1.651 m)    Weight:   Wt Readings from Last 1 Encounters:  05/24/20  45.8 kg    Estimated Nutritional Needs:  Kcal:  1700-1900 Protein:  85-95 grams Fluid:  >/= 1.7L/d     Jessica Ostheim, MS, RD, LDN, CNSC Inpatient Clinical Dietitian RD pager # available in AMION  After hours/weekend pager # available in AMION  

## 2020-06-03 NOTE — Progress Notes (Signed)
Occupational Therapy Treatment Patient Details Name: Mell Guia MRN: [id number] DOB: 04-29-1941 Today's Date: 06/05/2020    History of present illness Pt is 79yo male presenting to the ED with increased stridor and SOB, recent history of choking on his food. 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 Received direct laryngoscopy and trach tube placement 05/23/20. Pt had PEG placed 05/28/20.  Plan is to tx to [facility] for chemo/radiation. PMH supraglottic carcinoma/mass, hx seizurse, A-fib, COPD, cardiomyopathy, alcohol abuse   OT comments  Pt agreeable to OOB with OT and grooming activity.  Follow Up Recommendations  SNF    Equipment Recommendations  Other (comment)    Recommendations for Other Services      Precautions / Restrictions Precautions Precautions: Fall Precaution Comments: Trach; Peg Tube, Watch HR and O2       Mobility Bed Mobility Overal bed mobility: Needs Assistance Bed Mobility: Supine to Sit     Supine to sit: Min assist     General bed mobility comments: Min assist to scoot forward to edge of bed.  Able to support self seated.  Transfers Overall transfer level: Needs assistance Equipment used: 2 person hand held assist Transfers: Sit to/from [employer] Transfers Sit to Stand: +2 safety/equipment;Min assist Stand pivot transfers: +2 safety/equipment;Min assist            Balance Overall balance assessment: Needs assistance Sitting-balance support: No upper extremity supported;Feet supported Sitting balance-Leahy Scale: Good     Standing balance support: Bilateral upper extremity supported Standing balance-Leahy Scale: Fair                             ADL either performed or assessed with clinical judgement   ADL Overall ADL's : Needs assistance/impaired     Grooming: Set up;Sitting;Wash/dry face                   Toilet Transfer: Stand-pivot;Moderate assistance;+2 for physical  assistance Toilet Transfer Details (indicate cue type and reason): bed to chair Toileting- Clothing Manipulation and Hygiene: Moderate assistance;Sit to/from stand       Functional mobility during ADLs: Moderate assistance;+2 for safety/equipment;Cueing for safety;Cueing for sequencing General ADL Comments: pt willing to get OOB with OT.  RN present for part of OT eval.     Vision Baseline Vision/History: No visual deficits            Cognition Arousal/Alertness: Awake/alert Behavior During Therapy: WFL for tasks assessed/performed Overall Cognitive Status: Within Functional Limits for tasks assessed                                 General Comments: Appears functional - limited by trach. Able to follow commands.                   Pertinent Vitals/ Pain       Pain Assessment: No/denies pain         Frequency  Min 2X/week        Progress Toward Goals  OT Goals(current goals can now be found in the care plan section)  Progress towards OT goals: Progressing toward goals     Plan Discharge plan remains appropriate       AM-PAC OT "6 Clicks" Daily Activity     Outcome Measure   Help from another person eating meals?: A Little Help from another person  taking care of personal grooming?: A Little Help from another person toileting, which includes using toliet, bedpan, or urinal?: A Lot Help from another person bathing (including washing, rinsing, drying)?: A Little Help from another person to put on and taking off regular upper body clothing?: A Little Help from another person to put on and taking off regular lower body clothing?: A Lot 6 Click Score: 16    End of Session Equipment Utilized During Treatment: Oxygen  OT Visit Diagnosis: Unsteadiness on feet (R26.81);Other abnormalities of gait and mobility (R26.89);Muscle weakness (generalized) (M62.81)   Activity Tolerance Patient tolerated treatment well   Patient Left in chair;with call  bell/phone within reach;with chair alarm set;with nursing/sitter in room   Nurse Communication Mobility status        Time: 0940-1001 OT Time Calculation (min): 21 min  Charges: OT General Charges $OT Visit: 1 Visit OT Treatments $Self Care/Home Management : 8-22 mins  Kari Baars, [place] [phone] Office- [phone]      Fabrizzio Marcella, Edwena Felty D 06/26/2020, 11:12 AM

## 2020-06-03 NOTE — TOC Transition Note (Signed)
Transition of Care [facility]) - CM/SW Discharge Note   Patient Details  Name: [name] MRN: [id number] Date of Birth: [birth year]  Transition of Care [facility]) CM/SW Contact:  Ross Ludwig, LCSW Phone Number: 06/14/2020, 12:13 PM   Clinical Narrative:    CSW was informed by Doroteo Bradford at [facility] that patient has been approved and they can accept him today.  CSW spoke to physician, who is in agreement for patient to go to [facility].  Patient to be d/c'ed today to [facility] [phone] receiving physician is Dr. Satira Sark.  Patient and family agreeable to plans will transport via [facility] [place] to call report to [phone].  Patient's sister is aware that patient is discharging today.      Final next level of care: Skilled Nursing Facility Barriers to Discharge: Other (comment) (Patient transferring to [facility])   Patient Goals and CMS Choice Patient states their goals for this hospitalization and ongoing recovery are:: To go to [facility] and improve his function to return back home. CMS Medicare.gov Compare Post Acute Care list provided to:: Patient Represenative (must comment) Choice offered to / list presented to : Sibling  Discharge Placement              Patient chooses bed at: Other - please specify in the comment section below: ([facility]) Patient to be transferred to facility by: [place] Name of family member notified: [name] Patient and family notified of of transfer: 06/14/2020  Discharge Plan and Services                DME Arranged: N/A DME Agency: NA       HH Arranged: NA          Social Determinants of Health (SDOH) Interventions     Readmission Risk Interventions No flowsheet data found.

## 2020-06-03 NOTE — Progress Notes (Signed)
PMT progress note  Patient is awake alert resting in bed, watching TV He is able to cough and use the [place]ounker to suction out his secretions through his trach. Denies nausea vomiting today, tube feedings might be resumed soon.   BP (!) 102/52   Pulse 75   Temp 97.8 F (36.6 C) (Oral)   Resp 19   Ht 5\' 5"  (1.651 m)   Wt 45.8 kg   SpO2 99%   BMI 16.80 kg/m  Labs and imaging reviewed Chart reviewed Saw rad onc MD on 8/2.  No distress Awake alert Denies pain Has trach Has PEG No edema Is thin, has some muscle wasting Regular  PPS 40%  79 yo gentleman with A fib, pulmonary fibrosis, COPD with life limiting illness of R larynx squamous cell carcinoma.   To be d[facility]c to [facility] today. Recommend out patient palliative follow up as feasible.  Greater than 50%  of this time was spent counseling and coordinating care related to the above assessment and plan. 15 minutes spent [facility] palliative [phone].

## 2020-06-04 ENCOUNTER — Other Ambulatory Visit (HOSPITAL_COMMUNITY): Payer: Self-pay

## 2020-06-04 LAB — CBC WITH DIFFERENTIAL/PLATELET
Abs Immature Granulocytes: 0.11 10*3/uL — ABNORMAL HIGH (ref 0.00–0.07)
Basophils Absolute: 0 10*3/uL (ref 0.0–0.1)
Basophils Relative: 0 %
Eosinophils Absolute: 0.3 10*3/uL (ref 0.0–0.5)
Eosinophils Relative: 2 %
HCT: 31.9 % — ABNORMAL LOW (ref 39.0–52.0)
Hemoglobin: 11.4 g/dL — ABNORMAL LOW (ref 13.0–17.0)
Immature Granulocytes: 1 %
Lymphocytes Relative: 6 %
Lymphs Abs: 0.8 10*3/uL (ref 0.7–4.0)
MCH: 28.9 pg (ref 26.0–34.0)
MCHC: 35.7 g/dL (ref 30.0–36.0)
MCV: 81 fL (ref 80.0–100.0)
Monocytes Absolute: 1 10*3/uL (ref 0.1–1.0)
Monocytes Relative: 7 %
Neutro Abs: 11.1 10*3/uL — ABNORMAL HIGH (ref 1.7–7.7)
Neutrophils Relative %: 84 %
Platelets: 200 10*3/uL (ref 150–400)
RBC: 3.94 MIL/uL — ABNORMAL LOW (ref 4.22–5.81)
RDW: 13.7 % (ref 11.5–15.5)
WBC: 13.3 10*3/uL — ABNORMAL HIGH (ref 4.0–10.5)
nRBC: 0 % (ref 0.0–0.2)

## 2020-06-04 LAB — COMPREHENSIVE METABOLIC PANEL
ALT: 17 U/L (ref 0–44)
AST: 23 U/L (ref 15–41)
Albumin: 2.3 g/dL — ABNORMAL LOW (ref 3.5–5.0)
Alkaline Phosphatase: 52 U/L (ref 38–126)
Anion gap: 10 (ref 5–15)
BUN: 8 mg/dL (ref 8–23)
CO2: 24 mmol/L (ref 22–32)
Calcium: 7.9 mg/dL — ABNORMAL LOW (ref 8.9–10.3)
Chloride: 100 mmol/L (ref 98–111)
Creatinine, Ser: 0.87 mg/dL (ref 0.61–1.24)
GFR calc Af Amer: >60 mL/min (ref >60)
GFR calc non Af Amer: >60 mL/min (ref >60)
Glucose, Bld: 108 mg/dL — ABNORMAL HIGH (ref 70–99)
Potassium: 3.6 mmol/L (ref 3.5–5.1)
Sodium: 134 mmol/L — ABNORMAL LOW (ref 135–145)
Total Bilirubin: 1.1 mg/dL (ref 0.3–1.2)
Total Protein: 5.8 g/dL — ABNORMAL LOW (ref 6.5–8.1)

## 2020-06-04 LAB — PHOSPHORUS: Phosphorus: 1.9 mg/dL — ABNORMAL LOW (ref 2.5–4.6)

## 2020-06-04 MED ORDER — IOHEXOL 300 MG/ML  SOLN: 40.0000 mL | Freq: Once | INTRAMUSCULAR | Status: DC | PRN

## 2020-06-04 NOTE — Consult Note (Signed)
Referring Physician:  Bradyn Soward is an 79 y.o. male.                       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 Patient had poor oral intake for about 2 weeks now and has been running low blood pressure. He is responding to IV fluids and IV Glucagon. He is denying chest pain and limited activity. He is on anticoagulation.  Past Medical History:  Diagnosis Date  . Alcohol abuse   . Aortic atherosclerosis ([place])    a. 04/2020 noted on CT.  Marland Kitchen Cardiomyopathy ([place])    a. 12/2001 Echo: EF 55-65%; b. 02/2020 Echo: EF 45-50%, glob HK. Mild LVH of the basal segment.  Mildly reduced RV fxn. Nl RVSP. Mild MR. Triv AI.  Marland Kitchen COPD (chronic obstructive pulmonary disease) ([place])   . Persistent atrial fibrillation ([place])   . Seizures ([place])   . Squamous cell carcinoma - supraglottic mass    a. 04/2020 CT Head/neck: 3.3 x 3.7 x 3.0 cm R supraglottic mass consistent w/ squamous cell carcinoma. Bilat lymph nodes concerning for malignancy.      Past Surgical History:  Procedure Laterality Date  . DIRECT LARYNGOSCOPY N/A 05/23/2020   Procedure: DIRECT LARYNGOSCOPY with BIOPSY;  Surgeon: Melissa Montane, MD;  Location: [place];  Service: ENT;  Laterality: N/A;  . DIRECT LARYNGOSCOPY N/A 05/23/2020   Procedure: DIRECT LARYNGOSCOPY;  Surgeon: Melissa Montane, MD;  [facility];  Service: ENT;  Laterality: N/A;  . IR GASTROSTOMY TUBE MOD SED  05/28/2020  . TRACHEOSTOMY TUBE PLACEMENT N/A 05/23/2020   Procedure: TRACHEOSTOMY;  Surgeon: Melissa Montane, MD;  Location: [place];  Service: ENT;  Laterality: N/A;  . TRACHEOSTOMY TUBE  PLACEMENT N/A 05/23/2020   Procedure: TRACHEOSTOMY;  Surgeon: Melissa Montane, MD;  [facility];  Service: ENT;  Laterality: N/A;    Family History  Problem Relation Age of Onset  . Hypertension Other    Social History:  reports that he has quit smoking. He has never used smokeless tobacco. He reports previous alcohol use of about 6.0 standard drinks of alcohol per week. He reports that he does not use drugs.  Allergies: No Known Allergies  Medications Prior to Admission  Medication Sig Dispense Refill  . acetaminophen (TYLENOL) 325 MG tablet Take 2 tablets (650 mg total) by mouth every 6 (six) hours as needed for mild pain (or Fever >/= 101).    . chlorhexidine (PERIDEX) 0.12 % solution 15 mLs by Mouth Rinse route 2 (two) times daily. 120 mL 0  . diltiazem (CARDIZEM CD) 120 MG 24 hr capsule Take 1 capsule (120 mg total) by mouth daily. Hold for HR less than 55 or BP less than 100/60 30 capsule 3  . ELIQUIS 2.5 MG TABS tablet TAKE 1 TABLET BY MOUTH TWICE A DAY. 60 tablet 2  . ipratropium-albuterol (DUONEB) 0.5-2.5 (3) MG/3ML SOLN Take 3 mLs by nebulization every 6 (six) hours as needed (sob/wheezing).     . metoCLOPramide (REGLAN) 5 MG/ML injection Inject 1 mL (5 mg total) into the vein every 6 (six) hours. 2 mL 0  .  metoprolol tartrate (LOPRESSOR) 25 MG tablet Take 0.5 tablets (12.5 mg total) by mouth 2 (two) times daily.    . Mouthwashes (MOUTH RINSE) LIQD solution 15 mLs by Mouth Rinse route 2 times daily at 12 noon and 4 pm.  0  . Multiple Vitamin (MULTIVITAMIN WITH MINERALS) TABS tablet Place 1 tablet into feeding tube daily.    . Nutritional Supplements (FEEDING SUPPLEMENT, OSMOLITE 1.2 CAL,) LIQD Place 1,000 mLs into feeding tube continuous.  0  . Nutritional Supplements (FEEDING SUPPLEMENT, OSMOLITE 1.2 CAL,) LIQD Place 1,000 mLs into feeding tube continuous.  0  . Nutritional Supplements (FEEDING SUPPLEMENT, PROSOURCE TF,) liquid Place 45 mLs into feeding tube daily.    .  ondansetron (ZOFRAN) 4 MG tablet Take 1 tablet (4 mg total) by mouth every 6 (six) hours as needed for nausea. 20 tablet 0  . SYMBICORT 160-4.5 MCG/ACT inhaler INHALE 2 PUFFS INTO THE LUNGS IN THE MORNING AND AT BEDTIME 10.2 g 2  . VENTOLIN HFA 108 (90 Base) MCG/ACT inhaler INHALE 2 PUFFS INTO THE LUNGS EVERY 6 HOURS AS NEEDED FOR WHEEZING OR SHORTNESS OF BREATH. (Patient taking differently: Inhale 2 puffs into the lungs every 6 (six) hours as needed. ) 18 g 0    Results for orders placed or performed during the hospital encounter of 06/11/2020 (from the past 48 hour(s))  Comprehensive metabolic panel     Status: Abnormal   Collection Time: 06/04/20  8:32 AM  Result Value Ref Range   Sodium 134 (L) 135 - 145 mmol/L   Potassium 3.6 3.5 - 5.1 mmol/L   Chloride 100 98 - 111 mmol/L   CO2 24 22 - 32 mmol/L   Glucose, Bld 108 (H) 70 - 99 mg/dL    Comment: Glucose reference range applies only to samples taken after fasting for at least 8 hours.   BUN 8 8 - 23 mg/dL   Creatinine, Ser 0.87 0.61 - 1.24 mg/dL   Calcium 7.9 (L) 8.9 - 10.3 mg/dL   Total Protein 5.8 (L) 6.5 - 8.1 g/dL   Albumin 2.3 (L) 3.5 - 5.0 g/dL   AST 23 15 - 41 U/L   ALT 17 0 - 44 U/L   Alkaline Phosphatase 52 38 - 126 U/L   Total Bilirubin 1.1 0.3 - 1.2 mg/dL   GFR calc non Af Amer >60 >60 mL/min   GFR calc Af Amer >60 >60 mL/min   Anion gap 10 5 - 15    Comment: Performed at [facility], [place] [address], [place], [place] [address]  CBC with Differential/Platelet     Status: Abnormal   Collection Time: 06/04/20  8:32 AM  Result Value Ref Range   WBC 13.3 (H) 4.0 - 10.5 K/uL   RBC 3.94 (L) 4.22 - 5.81 MIL/uL   Hemoglobin 11.4 (L) 13.0 - 17.0 g/dL   HCT 31.9 (L) 39 - 52 %   MCV 81.0 80.0 - 100.0 fL   MCH 28.9 26.0 - 34.0 pg   MCHC 35.7 30.0 - 36.0 g/dL   RDW 13.7 11.5 - 15.5 %   Platelets 200 150 - 400 K/uL   nRBC 0.0 0.0 - 0.2 %   Neutrophils Relative % 84 %   Neutro Abs 11.1 (H) 1.7 - 7.7 K/uL   Lymphocytes  Relative 6 %   Lymphs Abs 0.8 0.7 - 4.0 K/uL   Monocytes Relative 7 %   Monocytes Absolute 1.0 0 - 1 K/uL   Eosinophils Relative 2 %  Eosinophils Absolute 0.3 0 - 0 K/uL   Basophils Relative 0 %   Basophils Absolute 0.0 0 - 0 K/uL   Immature Granulocytes 1 %   Abs Immature Granulocytes 0.11 (H) 0.00 - 0.07 K/uL    Comment: Performed at [facility], [place] [address], [place], [place] [address]   DG Abd 1 View  Result Date: 06/04/2020 CLINICAL DATA:  Ileus. EXAM: ABDOMEN - 1 VIEW COMPARISON:  Prior study 02/03/2020. FINDINGS: Gastrostomy tube in good anatomic position. Previously administered contrast has passed into the colon. Residual contrast again noted the rectosigmoid. Persistent dilated loops of small bowel are noted. Colon is nondistended. Partial small bowel obstruction and or adynamic ileus could present this fashion. No free air. IMPRESSION: Previously administered contrast has passed into the colon. Persistent dilated loops of small bowel. Similar findings noted on prior exam. Colon is nondistended. Partial small bowel obstruction and or adynamic ileus could present in this fashion. Electronically Signed   By: Marcello Moores  Register   On: 06/04/2020 13:18   DG ABDOMEN PEG TUBE LOCATION  Result Date: 06/04/2020 CLINICAL DATA:  Gastrostomy tube verification EXAM: ABDOMEN - 1 VIEW COMPARISON:  None. FINDINGS: There is enteric contrast within the stomach and first and second portions of the duodenum. There is also enteric contrast in the rectum. IMPRESSION: Enteric contrast in the stomach and first and second portions of the duodenum, confirming intragastric position of the gastrostomy tube. Electronically Signed   By: Ulyses Jarred M.D.   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 No pleural  effusion. Tracheostomy tube tip is just below the clavicular heads. Cardiomediastinal contours are normal. 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 Electronically Signed   By: Ulyses Jarred M.D.   On: 06/04/2020 01:13    Review Of Systems Constitutional: No fever, chills, chronic weight loss. Eyes: No vision change, No discharge or pain. Ears: No hearing loss, No tinnitus. Respiratory: No asthma, End stage COPD, pneumonias, shortness of breath. No hemoptysis. Positive tracheostomy. Cardiovascular: No chest pain, positive palpitation, leg edema. Gastrointestinal: No nausea, vomiting, diarrhea, constipation. No GI bleed. No hepatitis. Genitourinary: No dysuria, hematuria, kidney stone. No incontinance. Neurological: No headache, stroke, seizures.  Psychiatry: No psych facility admission for anxiety, depression, suicide. No detox. Skin: No rash. Musculoskeletal: Positive joint pain, no fibromyalgia. No neck pain, back pain. Lymphadenopathy: No lymphadenopathy. Hematology: No anemia or easy bruising.   There were no vitals taken for this visit. There is no height or weight on file to calculate BMI. General appearance: alert, cooperative, appears stated age and no distress Head: Normocephalic, atraumatic. Eyes: Brown eyes, pink conjunctiva, corneas clear. PERRL, EOM's intact. Neck: No adenopathy, no carotid bruit, no JVD, supple, symmetrical, trachea midline and thyroid not enlarged. Resp: Clear to auscultation bilaterally. Cardio: Irregular rate and rhythm, S1, S2 normal, II/VI systolic murmur, no click, rub or gallop GI: Soft, non-tender; bowel sounds normal; no organomegaly. Extremities: No edema, cyanosis or clubbing. Skin: Warm and dry.  Neurologic: Alert and oriented X 3, normal strength.   Assessment/Plan Atrial fibrillation with RVR Iatrogenic junctional rhythm Acute on chronic respiratory failure with hypoxia Dehydration Severe protein calori  malnutrition Adynamic ileus Dysphagia Hypertension Hypertension  Continue IV fluids.  Will use external pacing if needed. Hold diltiazem and discontinue metoprolol. Resume low dose diltiazem as 30 mg.  q 6 hours post HR recovery in 80's.  Time spent: Review of old records, Lab, x-rays, EKG, other cardiac tests, examination, discussion with patient over 70 minutes.  Birdie Riddle, MD  06/04/2020, 1:44 PM

## 2020-06-05 ENCOUNTER — Other Ambulatory Visit (HOSPITAL_COMMUNITY): Payer: Self-pay

## 2020-06-05 DIAGNOSIS — J449 Chronic obstructive pulmonary disease, unspecified: Secondary | ICD-10-CM

## 2020-06-05 DIAGNOSIS — J9621 Acute and chronic respiratory failure with hypoxia: Secondary | ICD-10-CM | POA: Diagnosis not present

## 2020-06-05 DIAGNOSIS — I482 Chronic atrial fibrillation, unspecified: Secondary | ICD-10-CM | POA: Diagnosis not present

## 2020-06-05 DIAGNOSIS — J8410 Pulmonary fibrosis, unspecified: Secondary | ICD-10-CM | POA: Diagnosis not present

## 2020-06-05 DIAGNOSIS — D099 Carcinoma in situ, unspecified: Secondary | ICD-10-CM

## 2020-06-05 LAB — CBC
HCT: 29.9 % — ABNORMAL LOW (ref 39.0–52.0)
Hemoglobin: 10.7 g/dL — ABNORMAL LOW (ref 13.0–17.0)
MCH: 29.3 pg (ref 26.0–34.0)
MCHC: 35.8 g/dL (ref 30.0–36.0)
MCV: 81.9 fL (ref 80.0–100.0)
Platelets: 189 10*3/uL (ref 150–400)
RBC: 3.65 MIL/uL — ABNORMAL LOW (ref 4.22–5.81)
RDW: 13.7 % (ref 11.5–15.5)
WBC: 14.4 10*3/uL — ABNORMAL HIGH (ref 4.0–10.5)
nRBC: 0 % (ref 0.0–0.2)

## 2020-06-05 LAB — BASIC METABOLIC PANEL
Anion gap: 7 (ref 5–15)
BUN: 11 mg/dL (ref 8–23)
CO2: 21 mmol/L — ABNORMAL LOW (ref 22–32)
Calcium: 7.4 mg/dL — ABNORMAL LOW (ref 8.9–10.3)
Chloride: 103 mmol/L (ref 98–111)
Creatinine, Ser: 0.73 mg/dL (ref 0.61–1.24)
GFR calc Af Amer: >60 mL/min (ref >60)
GFR calc non Af Amer: >60 mL/min (ref >60)
Glucose, Bld: 132 mg/dL — ABNORMAL HIGH (ref 70–99)
Potassium: 3.5 mmol/L (ref 3.5–5.1)
Sodium: 131 mmol/L — ABNORMAL LOW (ref 135–145)

## 2020-06-05 LAB — TRIGLYCERIDES: Triglycerides: 60 mg/dL (ref <150)

## 2020-06-05 LAB — MAGNESIUM: Magnesium: 2.1 mg/dL (ref 1.7–2.4)

## 2020-06-05 LAB — PHOSPHORUS: Phosphorus: 2 mg/dL — ABNORMAL LOW (ref 2.5–4.6)

## 2020-06-05 NOTE — Consult Note (Addendum)
Ref: Elsie Stain, MD   Subjective:  Improved heart rate.   Objective:  Vital Signs in the last 24 hours:  P: 70's. R: 20, BP: 115/60.  Physical Exam: BP Readings from Last 1 Encounters:  06/29/2020 (!) 103/52     Wt Readings from Last 1 Encounters:  05/24/20 45.8 kg    Weight change:  There is no height or weight on file to calculate BMI. HEENT: [place]/AT, Eyes-Brown, Conjunctiva-Pink, Sclera-Non-icteric Neck: No JVD, No bruit, Trachea midline. Lungs:  Clear, Bilateral. Cardiac:  Irregular rhythm, normal S1 and S2, no S3. II/VI systolic murmur. Abdomen:  Soft, non-tender. BS present. Extremities:  No edema present. No cyanosis. No clubbing. CNS: AxOx3, Cranial nerves grossly intact, moves all 4 extremities.  Skin: Warm and dry.   Intake/Output from previous day: No intake/output data recorded.    Lab Results: BMET    Component Value Date/Time   NA 131 (L) 06/05/2020 0716   NA 134 (L) 06/04/2020 0832   NA 129 (L) 06/02/2020 0438   K 3.5 06/05/2020 0716   K 3.6 06/04/2020 0832   K 3.0 (L) 06/19/2020 0438   CL 103 06/05/2020 0716   CL 100 06/04/2020 0832   CL 96 (L) 06/06/2020 0438   CO2 21 (L) 06/05/2020 0716   CO2 24 06/04/2020 0832   CO2 26 06/07/2020 0438   GLUCOSE 132 (H) 06/05/2020 0716   GLUCOSE 108 (H) 06/04/2020 0832   GLUCOSE 119 (H) 06/11/2020 0438   BUN 11 06/05/2020 0716   BUN 8 06/04/2020 0832   BUN 12 06/01/2020 0438   CREATININE 0.73 06/05/2020 0716   CREATININE 0.87 06/04/2020 0832   CREATININE 0.78 06/08/2020 0438   CALCIUM 7.4 (L) 06/05/2020 0716   CALCIUM 7.9 (L) 06/04/2020 0832   CALCIUM 7.8 (L) 06/21/2020 0438   GFRNONAA >60 06/05/2020 0716   GFRNONAA >60 06/04/2020 0832   GFRNONAA >60 06/13/2020 0438   GFRAA >60 06/05/2020 0716   GFRAA >60 06/04/2020 0832   GFRAA >60 06/05/2020 0438   CBC    Component Value Date/Time   WBC 14.4 (H) 06/05/2020 0716   RBC 3.65 (L) 06/05/2020 0716   HGB 10.7 (L) 06/05/2020 0716   HCT 29.9 (L)  06/05/2020 0716   PLT 189 06/05/2020 0716   MCV 81.9 06/05/2020 0716   MCH 29.3 06/05/2020 0716   MCHC 35.8 06/05/2020 0716   RDW 13.7 06/05/2020 0716   LYMPHSABS 0.8 06/04/2020 0832   MONOABS 1.0 06/04/2020 0832   EOSABS 0.3 06/04/2020 0832   BASOSABS 0.0 06/04/2020 0832   HEPATIC Function Panel Recent Labs    05/27/20 0235 05/28/20 0358 06/04/20 0832  PROT 5.5* 5.4* 5.8*   HEMOGLOBIN A1C No components found for: HGA1C,  MPG CARDIAC ENZYMES Lab Results  Component Value Date   CKTOTAL 271 07/10/2016   TROPONINI <0.03 07/10/2016   TROPONINI <0.03 07/10/2016   TROPONINI <0.03 07/10/2016   BNP No results for input(s): PROBNP in the last 8760 hours. TSH No results for input(s): TSH in the last 8760 hours. CHOLESTEROL No results for input(s): CHOL in the last 8760 hours.  Scheduled Meds: Continuous Infusions: PRN Meds:.iohexol  Assessment/Plan: Atrial fibrillation with RVR S/P junctional rhythm Acute on chronic respiratory failure with hypoxia Dehydration, improving Severe protein calorie malnutrition Adynamic ileus Dysphagia Hypertension Hyperlipidemia  Continue low dose diltiazem.   LOS: 0 days   Time spent including chart review, lab review, examination, discussion with patient, nurse, tech and physician : 30 min  Dixie Dials  MD  06/05/2020, 1:24 PM

## 2020-06-05 NOTE — Consult Note (Signed)
[facility] [place]  PULMONARY SERVICE  Date of Service: 06/05/2020  PULMONARY CRITICAL CARE Markey Deady  RDE:[id number]  DOB: [birth year]   DOA: 06/12/2020  Referring Physician: Merton Border, MD  HPI: Christhoper Busbee is a 79 y.o. male seen for follow up of Acute on Chronic Respiratory Failure.  Patient has multiple medical problems including COPD atrial fibrillation seizure disorder supraglottic mass pulmonary fibrosis alcohol tobacco abuse presented to the hospital having been found within epiglottic mass.  ENT saw the patient and the patient underwent a tracheostomy.  CT scan was done no evidence of metastatic disease was noted at that time.  Patient also did undergo PEG tube placement.  Subsequently required some rehabilitation was transferred to a [facility] for radiation therapy now presents to our facility for further management and weaning.  Right now appears to be in no distress comfortable without any distress  Review of Systems:  ROS performed and is unremarkable other than noted above.  Past Medical History:  Diagnosis Date  . Alcohol abuse   . Aortic atherosclerosis ([place])    a. 04/2020 noted on CT.  Marland Kitchen Cardiomyopathy ([place])    a. 12/2001 Echo: EF 55-65%; b. 02/2020 Echo: EF 45-50%, glob HK. Mild LVH of the basal segment.  Mildly reduced RV fxn. Nl RVSP. Mild MR. Triv AI.  Marland Kitchen COPD (chronic obstructive pulmonary disease) ([place])   . Persistent atrial fibrillation ([place])   . Seizures ([place])   . Squamous cell carcinoma - supraglottic mass    a. 04/2020 CT Head/neck: 3.3 x 3.7 x 3.0 cm R supraglottic mass consistent w/ squamous cell carcinoma. Bilat lymph nodes concerning for malignancy.    Past Surgical History:  Procedure Laterality Date  . DIRECT LARYNGOSCOPY N/A 05/23/2020   Procedure: DIRECT LARYNGOSCOPY with BIOPSY;  Surgeon: Melissa Montane, MD;  Location: [place];  Service: ENT;  Laterality: N/A;  . DIRECT LARYNGOSCOPY N/A  05/23/2020   Procedure: DIRECT LARYNGOSCOPY;  Surgeon: Melissa Montane, MD;  [facility];  Service: ENT;  Laterality: N/A;  . IR GASTROSTOMY TUBE MOD SED  05/28/2020  . TRACHEOSTOMY TUBE PLACEMENT N/A 05/23/2020   Procedure: TRACHEOSTOMY;  Surgeon: Melissa Montane, MD;  Location: [place];  Service: ENT;  Laterality: N/A;  . TRACHEOSTOMY TUBE PLACEMENT N/A 05/23/2020   Procedure: TRACHEOSTOMY;  Surgeon: Melissa Montane, MD;  [facility];  Service: ENT;  Laterality: N/A;    Social History:    reports that he has quit smoking. He has never used smokeless tobacco. He reports previous alcohol use of about 6.0 standard drinks of alcohol per week. He reports that he does not use drugs.  Family History: Non-Contributory to the present illness  No Known Allergies  Medications: Reviewed on Rounds  Physical Exam:  Vitals: Temperature is 99.8 pulse 89 respiratory rate 18 blood pressure is 118/74 saturations 100%  Ventilator Settings off the ventilator on T collar FiO2 28%  . General: Comfortable at this time . Eyes: Grossly normal lids, irises & conjunctiva . ENT: grossly tongue is normal . Neck: no obvious mass . Cardiovascular: S1-S2 normal no gallop or rub . Respiratory: No rhonchi no rales are noted at this time . Abdomen: Soft and nontender . Skin: no rash seen on limited exam . Musculoskeletal: not rigid . Psychiatric:unable to assess . Neurologic: no seizure no involuntary movements         Labs on Admission:  Basic Metabolic Panel: Recent Labs  Lab 06/01/20 0458 06/02/20  0501 06/12/2020 0438 06/04/20 0832 06/04/20 1455 06/05/20 0716  NA 137 133* 129* 134*  --  131*  K 3.3* 3.5 3.0* 3.6  --  3.5  CL 96* 96* 96* 100  --  103  CO2 30 27 26 24   --  21*  GLUCOSE 133* 105* 119* 108*  --  132*  BUN 23 17 12 8   --  11  CREATININE 0.87 0.81 0.78 0.87  --  0.73  CALCIUM 8.6* 8.2* 7.8* 7.9*  --  7.4*  MG  --   --  2.0  --   --  2.1  PHOS  --   --   --   --  1.9* 2.0*    No  results for input(s): PHART, PCO2ART, PO2ART, HCO3, O2SAT in the last 168 hours.  Liver Function Tests: Recent Labs  Lab 06/04/20 0832  AST 23  ALT 17  ALKPHOS 52  BILITOT 1.1  PROT 5.8*  ALBUMIN 2.3*   No results for input(s): LIPASE, AMYLASE in the last 168 hours. No results for input(s): AMMONIA in the last 168 hours.  CBC: Recent Labs  Lab 05/30/20 0445 05/30/20 0445 05/31/20 0451 05/31/20 0451 06/01/20 0458 06/02/20 0501 06/06/2020 0438 06/04/20 0832 06/05/20 0716  WBC 16.6*   < > 17.8*   < > 13.4* 13.8* 11.2* 13.3* 14.4*  NEUTROABS 14.5*  --  15.4*  --  11.0* 10.9*  --  11.1*  --   HGB 12.0*   < > 11.6*   < > 12.1* 11.0* 10.5* 11.4* 10.7*  HCT 34.1*   < > 33.2*   < > 35.1* 30.7* 30.0* 31.9* 29.9*  MCV 82.0   < > 82.4   < > 83.2 80.8 82.2 81.0 81.9  PLT 198   < > 211   < > 197 211 180 200 189   < > = values in this interval not displayed.    Cardiac Enzymes: No results for input(s): CKTOTAL, CKMB, CKMBINDEX, TROPONINI in the last 168 hours.  BNP (last 3 results) Recent Labs    05/21/20 1858  BNP 284.5*    ProBNP (last 3 results) No results for input(s): PROBNP in the last 8760 hours.   Radiological Exams on Admission: DG Abd 1 View  Result Date: 06/05/2020 CLINICAL DATA:  Ileus. EXAM: ABDOMEN - 1 VIEW COMPARISON:  06/04/2020 FINDINGS: Persistent significant gaseous dilatation of small bowel loops, measuring up to 4 centimeters in the central abdomen. Contrast delineates normal appearance of distal colon. There is no evidence for free intraperitoneal air. IMPRESSION: Persistent dilatation of small bowel loops in the absence of complete obstruction. Considerations include partial small bowel obstruction or ileus. Electronically Signed   By: Nolon Nations M.D.   On: 06/05/2020 13:17   DG Abd 1 View  Result Date: 06/04/2020 CLINICAL DATA:  Ileus. EXAM: ABDOMEN - 1 VIEW COMPARISON:  Prior study 02/03/2020. FINDINGS: Gastrostomy tube in good anatomic position.  Previously administered contrast has passed into the colon. Residual contrast again noted the rectosigmoid. Persistent dilated loops of small bowel are noted. Colon is nondistended. Partial small bowel obstruction and or adynamic ileus could present this fashion. No free air. IMPRESSION: Previously administered contrast has passed into the colon. Persistent dilated loops of small bowel. Similar findings noted on prior exam. Colon is nondistended. Partial small bowel obstruction and or adynamic ileus could present in this fashion. Electronically Signed   By: [place]   On: 06/04/2020 13:18   DG ABDOMEN PEG  TUBE LOCATION  Result Date: 06/04/2020 CLINICAL DATA:  Gastrostomy tube verification EXAM: ABDOMEN - 1 VIEW COMPARISON:  None. FINDINGS: There is enteric contrast within the stomach and first and second portions of the duodenum. There is also enteric contrast in the rectum. IMPRESSION: Enteric contrast in the stomach and first and second portions of the duodenum, confirming intragastric position of the gastrostomy tube. Electronically Signed   By: Ulyses Jarred M.D.   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 No pleural effusion. Tracheostomy tube tip is just below the clavicular heads. Cardiomediastinal contours are normal. 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 Electronically Signed   By: Ulyses Jarred M.D.   On: 06/04/2020 01:13    Assessment/Plan Active Problems:   Acute on chronic respiratory failure with hypoxia (HCC)   Chronic atrial fibrillation (HCC)   COPD, severe (HCC)   Squamous cell carcinoma - supraglottic mass   Pulmonary fibrosis ([place])   1. Acute on chronic respiratory failure with hypoxia plan is going to be to continue with the T collar at this  time.  Patient had the trach done because of laryngeal CA unclear whether the patient is going to be able to be decannulated. 2. Atrial fibrillation appears to be on the chronic rate is controlled we will continue to monitor closely. 3. Severe COPD medical management we will continue to follow. 4. 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 5. Pulmonary fibrosis at baseline we will continue to monitor  I have personally seen and evaluated the patient, evaluated laboratory and imaging results, formulated the assessment and plan and placed orders. The Patient requires high complexity decision making with multiple systems involvement.  Case was discussed on Rounds with the Respiratory Therapy Director and the Respiratory staff Time Spent 75minutes  Nadeen Shipman A Lucy Woolever, MD [facility] Pulmonary Critical Care Medicine Sleep Medicine

## 2020-06-07 ENCOUNTER — Telehealth: Payer: Self-pay

## 2020-06-07 ENCOUNTER — Ambulatory Visit: Payer: Medicare Other | Admitting: Radiation Oncology

## 2020-06-07 DIAGNOSIS — J8410 Pulmonary fibrosis, unspecified: Secondary | ICD-10-CM | POA: Diagnosis not present

## 2020-06-07 DIAGNOSIS — J449 Chronic obstructive pulmonary disease, unspecified: Secondary | ICD-10-CM | POA: Diagnosis present

## 2020-06-07 DIAGNOSIS — J9621 Acute and chronic respiratory failure with hypoxia: Secondary | ICD-10-CM | POA: Diagnosis not present

## 2020-06-07 DIAGNOSIS — I482 Chronic atrial fibrillation, unspecified: Secondary | ICD-10-CM | POA: Diagnosis present

## 2020-06-07 DIAGNOSIS — D099 Carcinoma in situ, unspecified: Secondary | ICD-10-CM | POA: Diagnosis present

## 2020-06-07 LAB — COMPREHENSIVE METABOLIC PANEL
ALT: 16 U/L (ref 0–44)
AST: 19 U/L (ref 15–41)
Albumin: 2.1 g/dL — ABNORMAL LOW (ref 3.5–5.0)
Alkaline Phosphatase: 48 U/L (ref 38–126)
Anion gap: 8 (ref 5–15)
BUN: 16 mg/dL (ref 8–23)
CO2: 21 mmol/L — ABNORMAL LOW (ref 22–32)
Calcium: 7.9 mg/dL — ABNORMAL LOW (ref 8.9–10.3)
Chloride: 101 mmol/L (ref 98–111)
Creatinine, Ser: 0.68 mg/dL (ref 0.61–1.24)
GFR calc Af Amer: >60 mL/min (ref >60)
GFR calc non Af Amer: >60 mL/min (ref >60)
Glucose, Bld: 109 mg/dL — ABNORMAL HIGH (ref 70–99)
Potassium: 4.1 mmol/L (ref 3.5–5.1)
Sodium: 130 mmol/L — ABNORMAL LOW (ref 135–145)
Total Bilirubin: 0.7 mg/dL (ref 0.3–1.2)
Total Protein: 5.4 g/dL — ABNORMAL LOW (ref 6.5–8.1)

## 2020-06-07 LAB — CBC
HCT: 29.4 % — ABNORMAL LOW (ref 39.0–52.0)
Hemoglobin: 10.4 g/dL — ABNORMAL LOW (ref 13.0–17.0)
MCH: 28.6 pg (ref 26.0–34.0)
MCHC: 35.4 g/dL (ref 30.0–36.0)
MCV: 80.8 fL (ref 80.0–100.0)
Platelets: 204 10*3/uL (ref 150–400)
RBC: 3.64 MIL/uL — ABNORMAL LOW (ref 4.22–5.81)
RDW: 13.8 % (ref 11.5–15.5)
WBC: 9.6 10*3/uL (ref 4.0–10.5)
nRBC: 0 % (ref 0.0–0.2)

## 2020-06-07 NOTE — Progress Notes (Signed)
Pulmonary [place]   PULMONARY CRITICAL CARE SERVICE  PROGRESS NOTE  Date of Service: 06/07/2020  Sahan Pen  PXT:[id number]  DOB: 12/07/1940   DOA: 06/19/2020  Referring Physician: Merton Border, MD  HPI: [name] is a 79 y.o. male seen for follow up of Acute on Chronic Respiratory Failure.  Patient currently is on T collar has been on 28% FiO2 with should have the trach changed out today  Medications: Reviewed on Rounds  Physical Exam:  Vitals: Temperature is 97.3 pulse 72 respiratory rate is 17 blood pressure is 105/54 saturations 98%  Ventilator Settings on T collar with an FiO2 of 28%  . General: Comfortable at this time . Eyes: Grossly normal lids, irises & conjunctiva . ENT: grossly tongue is normal . Neck: no obvious mass . Cardiovascular: S1 S2 normal no gallop . Respiratory: No rhonchi no rales noted at this time . Abdomen: soft . Skin: no rash seen on limited exam . Musculoskeletal: not rigid . Psychiatric:unable to assess . Neurologic: no seizure no involuntary movements         Lab Data:   Basic Metabolic Panel: Recent Labs  Lab 06/02/20 0501 06/23/2020 0438 06/04/20 0832 06/04/20 1455 06/05/20 0716 06/07/20 0609  NA 133* 129* 134*  --  131* 130*  K 3.5 3.0* 3.6  --  3.5 4.1  CL 96* 96* 100  --  103 101  CO2 27 26 24   --  21* 21*  GLUCOSE 105* 119* 108*  --  132* 109*  BUN 17 12 8   --  11 16  CREATININE 0.81 0.78 0.87  --  0.73 0.68  CALCIUM 8.2* 7.8* 7.9*  --  7.4* 7.9*  MG  --  2.0  --   --  2.1  --   PHOS  --   --   --  1.9* 2.0*  --     ABG: No results for input(s): PHART, PCO2ART, PO2ART, HCO3, O2SAT in the last 168 hours.  Liver Function Tests: Recent Labs  Lab 06/04/20 0832 06/07/20 0609  AST 23 19  ALT 17 16  ALKPHOS 52 48  BILITOT 1.1 0.7  PROT 5.8* 5.4*  ALBUMIN 2.3* 2.1*   No results for input(s): LIPASE, AMYLASE in the last 168 hours. No results for input(s): AMMONIA  in the last 168 hours.  CBC: Recent Labs  Lab 06/01/20 0458 06/01/20 0458 06/02/20 0501 06/14/2020 0438 06/04/20 0832 06/05/20 0716 06/07/20 0609  WBC 13.4*   < > 13.8* 11.2* 13.3* 14.4* 9.6  NEUTROABS 11.0*  --  10.9*  --  11.1*  --   --   HGB 12.1*   < > 11.0* 10.5* 11.4* 10.7* 10.4*  HCT 35.1*   < > 30.7* 30.0* 31.9* 29.9* 29.4*  MCV 83.2   < > 80.8 82.2 81.0 81.9 80.8  PLT 197   < > 211 180 200 189 204   < > = values in this interval not displayed.    Cardiac Enzymes: No results for input(s): CKTOTAL, CKMB, CKMBINDEX, TROPONINI in the last 168 hours.  BNP (last 3 results) Recent Labs    05/21/20 1858  BNP 284.5*    ProBNP (last 3 results) No results for input(s): PROBNP in the last 8760 hours.  Radiological Exams: No results found.  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 Acute on chronic respiratory failure with hypoxia plan is to continue with T collar trials change trach capped today. 2. Chronic atrial fibrillation rate is controlled at this time we will continue to follow 3. Severe COPD medical management we will continue to follow along. 4. Squamous cell carcinoma has been treated status post radiation 5. Pulmonary fibrosis supportive care   I have personally seen and evaluated the patient, evaluated laboratory and imaging results, formulated the assessment and plan and placed orders. The Patient requires high complexity decision making with multiple systems involvement.  Rounds were done with the Respiratory Therapy Director and Staff therapists and discussed with nursing staff also.  Allyne Gee, MD [facility] Pulmonary Critical Care Medicine Sleep Medicine

## 2020-06-07 NOTE — Telephone Encounter (Signed)
Received call from radiation therapist that patient did not show up for his new start appointment to begin radiation today. Inquired why since patient had been transferred to [facility] to help simplify care with oncology team. Therapist stated she no longer saw patient on in-patient roster, and believed he was discharged. Reviewed patient's chart and saw that patient had been discharged to [facility] last Thursday 06/02/2020. Spoke with Caryl Pina in case management at [facility] to see how we could set up transportation for patient to and from [facility] for daily radiation treatment. Caryl Pina informed me that patients admitted to their department are not able to be transferred back and forth between facilities for daily treatments like chemo or radiation, and that if Dr. Isidore Moos felt this was necessary, patient would need to be readmitted to [facility] to receive radiation.   Called patient's sister Delma Freeze since she appears to be the family member making medical decisions on patient's behalf. Explained situation to [place], and she stated that she did not want her brother admitted back to the hospital and that she understood that that meant patient would not be able to receive radiation at this time. Encouraged sister to call our department back if she or the patient changed their mind. Updated Dr. Sondra Come and lead therapist on situation, and will route this message to Dr. Isidore Moos for her to review when she's back in the office next week

## 2020-06-08 ENCOUNTER — Ambulatory Visit: Payer: Medicare Other | Admitting: Radiation Oncology

## 2020-06-08 ENCOUNTER — Other Ambulatory Visit (HOSPITAL_COMMUNITY): Payer: Self-pay

## 2020-06-08 DIAGNOSIS — J9621 Acute and chronic respiratory failure with hypoxia: Secondary | ICD-10-CM | POA: Diagnosis not present

## 2020-06-08 DIAGNOSIS — J449 Chronic obstructive pulmonary disease, unspecified: Secondary | ICD-10-CM | POA: Diagnosis not present

## 2020-06-08 DIAGNOSIS — J8410 Pulmonary fibrosis, unspecified: Secondary | ICD-10-CM | POA: Diagnosis not present

## 2020-06-08 DIAGNOSIS — I482 Chronic atrial fibrillation, unspecified: Secondary | ICD-10-CM | POA: Diagnosis not present

## 2020-06-08 LAB — POTASSIUM: Potassium: 4.1 mmol/L (ref 3.5–5.1)

## 2020-06-08 LAB — MAGNESIUM: Magnesium: 1.9 mg/dL (ref 1.7–2.4)

## 2020-06-08 LAB — PHOSPHORUS: Phosphorus: 2.8 mg/dL (ref 2.5–4.6)

## 2020-06-08 NOTE — Progress Notes (Signed)
Pulmonary [place]   PULMONARY CRITICAL CARE SERVICE  PROGRESS NOTE  Date of Service: 06/08/2020  [name]  ZOX:[id number]  DOB: 1941/04/10   DOA: 06/01/2020  Referring Physician: Merton Border, MD  HPI: [name] is a 79 y.o. male seen for follow up of Acute on Chronic Respiratory Failure.  Patient is off the ventilator on T collar currently on 28% FiO2 secretions are still significant.  Lurline Idol was changed out yesterday patient is tolerating the cuffless trach well also has been using the PMV appears to be tolerating that well to  Medications: Reviewed on Rounds  Physical Exam:  Vitals: Temperature 97.1 pulse 71 respiratory rate 19 blood pressure 100/56 saturations 96%  Ventilator Settings on T collar FiO2 28%  . General: Comfortable at this time . Eyes: Grossly normal lids, irises & conjunctiva . ENT: grossly tongue is normal . Neck: no obvious mass . Cardiovascular: S1 S2 normal no gallop . Respiratory: No rhonchi very coarse breath sounds . Abdomen: soft . Skin: no rash seen on limited exam . Musculoskeletal: not rigid . Psychiatric:unable to assess . Neurologic: no seizure no involuntary movements         Lab Data:   Basic Metabolic Panel: Recent Labs  Lab 06/02/20 0501 06/02/20 0501 06/19/2020 0438 06/04/20 0832 06/04/20 1455 06/05/20 0716 06/07/20 0609 06/08/20 0447  NA 133*  --  129* 134*  --  131* 130*  --   K 3.5   < > 3.0* 3.6  --  3.5 4.1 4.1  CL 96*  --  96* 100  --  103 101  --   CO2 27  --  26 24  --  21* 21*  --   GLUCOSE 105*  --  119* 108*  --  132* 109*  --   BUN 17  --  12 8  --  11 16  --   CREATININE 0.81  --  0.78 0.87  --  0.73 0.68  --   CALCIUM 8.2*  --  7.8* 7.9*  --  7.4* 7.9*  --   MG  --   --  2.0  --   --  2.1  --  1.9  PHOS  --   --   --   --  1.9* 2.0*  --  2.8   < > = values in this interval not displayed.    ABG: No results for input(s): PHART, PCO2ART, PO2ART, HCO3,  O2SAT in the last 168 hours.  Liver Function Tests: Recent Labs  Lab 06/04/20 0832 06/07/20 0609  AST 23 19  ALT 17 16  ALKPHOS 52 48  BILITOT 1.1 0.7  PROT 5.8* 5.4*  ALBUMIN 2.3* 2.1*   No results for input(s): LIPASE, AMYLASE in the last 168 hours. No results for input(s): AMMONIA in the last 168 hours.  CBC: Recent Labs  Lab 06/02/20 0501 06/27/2020 0438 06/04/20 0832 06/05/20 0716 06/07/20 0609  WBC 13.8* 11.2* 13.3* 14.4* 9.6  NEUTROABS 10.9*  --  11.1*  --   --   HGB 11.0* 10.5* 11.4* 10.7* 10.4*  HCT 30.7* 30.0* 31.9* 29.9* 29.4*  MCV 80.8 82.2 81.0 81.9 80.8  PLT 211 180 200 189 204    Cardiac Enzymes: No results for input(s): CKTOTAL, CKMB, CKMBINDEX, TROPONINI in the last 168 hours.  BNP (last 3 results) Recent Labs    05/21/20 1858  BNP 284.5*    ProBNP (last 3 results) No results for input(s): PROBNP in  the last 8760 hours.  Radiological Exams: DG Abd Portable 1V  Result Date: 06/08/2020 CLINICAL DATA:  Abdominal ileus. EXAM: PORTABLE ABDOMEN - 1 VIEW COMPARISON:  06/05/2020 FINDINGS: Minimal residual contrast is noted in the colon. Persistent diffuse air-filled small bowel and colon consistent with a diffuse ileus. Feeding gastrostomy tube is again noted. Bibasilar atelectasis. IMPRESSION: Persistent diffuse ileus bowel gas pattern. Electronically Signed   By: Marijo Sanes M.D.   On: 06/08/2020 06:41    Assessment/Plan Active Problems:   Acute on chronic respiratory failure with hypoxia (HCC)   Chronic atrial fibrillation (HCC)   COPD, severe (HCC)   Squamous cell carcinoma - supraglottic mass   Pulmonary fibrosis ([place])   1. Acute on chronic respiratory failure hypoxia plan is to continue with the T collar titrate oxygen continue pulmonary toilet.  We will make an attempt to use the PMV today 2. Chronic atrial fibrillation rate controlled 3. Severe COPD at baseline we will continue with supportive care 4. 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 Pulmonary fibrosis supportive care   I have personally seen and evaluated the patient, evaluated laboratory and imaging results, formulated the assessment and plan and placed orders. The Patient requires high complexity decision making with multiple systems involvement.  Rounds were done with the Respiratory Therapy Director and Staff therapists and discussed with nursing staff also.  Allyne Gee, MD [facility] Pulmonary Critical Care Medicine Sleep Medicine

## 2020-06-09 ENCOUNTER — Ambulatory Visit: Payer: Medicare Other

## 2020-06-09 DIAGNOSIS — J8410 Pulmonary fibrosis, unspecified: Secondary | ICD-10-CM | POA: Diagnosis not present

## 2020-06-09 DIAGNOSIS — J449 Chronic obstructive pulmonary disease, unspecified: Secondary | ICD-10-CM | POA: Diagnosis not present

## 2020-06-09 DIAGNOSIS — I482 Chronic atrial fibrillation, unspecified: Secondary | ICD-10-CM | POA: Diagnosis not present

## 2020-06-09 DIAGNOSIS — J9621 Acute and chronic respiratory failure with hypoxia: Secondary | ICD-10-CM | POA: Diagnosis not present

## 2020-06-09 NOTE — Progress Notes (Signed)
S: Patient 79 year old male with trache and on T collar.  He was asleep in bed and woke up and answered my questions as yes no.  He is not having any pain, he notes he is breathing well, and he had a bowel movement today.  He is due to start radiation therapy next week.    O: Older male lying in bed, in no apparent distress with T collar in place.   Vitals reviewed in Epic and stable HEENT:  Sclerae anicteric.  Oropharynx clear and moist.Neck is supple.  NODES:  No cervical, supraclavicular adenopathy.  LUNGS:  Clear to auscultation bilaterally. Decreased air movement HEART:  Regular rate and rhythm. No murmur appreciated. ABDOMEN:  Soft, nontender.  Positive, normoactive bowel sounds. No organomegaly palpated. EXTREMITIES:  No peripheral edema.   SKIN:  Clear with no obvious rashes or skin changes.  NEURO:  Nonfocal. Well oriented.  Appropriate affect.  Assessment and Plan:  Squamous cell cancer: to begin radiation on 06/15/2020, he will see oncology as an outpatient.   COPD: he is following with pulmonology and has Tcollar in place.    We are following along with you, and appreciate your excellent care of this patient.    Please call us if we can be of any further assistance.    Wilber Bihari, NP 06/09/20 5:21 PM Medical Oncology and Hematology [facility] [place], [place] [address] Tel. [phone]    Fax. [phone]

## 2020-06-09 NOTE — Progress Notes (Addendum)
Pulmonary [place]   PULMONARY CRITICAL CARE SERVICE  PROGRESS NOTE  Date of Service: 06/09/2020  [name]  NKN:[id number]  DOB: 1941/02/27   DOA: 06/16/2020  Referring Physician: Merton Border, MD  HPI: [name] is a 79 y.o. male seen for follow up of Acute on Chronic Respiratory Failure.  Patient mains on aerosol trach collar 28% FiO2 satting well no fever or distress.  Medications: Reviewed on Rounds  Physical Exam:  Vitals: Pulse 70 respirations 16 BP 104/58 O2 sat 97% temp 97.3  Ventilator Settings ATC 28%  . General: Comfortable at this time . Eyes: Grossly normal lids, irises & conjunctiva . ENT: grossly tongue is normal . Neck: no obvious mass . Cardiovascular: S1 S2 normal no gallop . Respiratory: No rales or rhonchi noted . Abdomen: soft . Skin: no rash seen on limited exam . Musculoskeletal: not rigid . Psychiatric:unable to assess . Neurologic: no seizure no involuntary movements         Lab Data:   Basic Metabolic Panel: Recent Labs  Lab 06/23/2020 0438 06/04/20 0832 06/04/20 1455 06/05/20 0716 06/07/20 0609 06/08/20 0447  NA 129* 134*  --  131* 130*  --   K 3.0* 3.6  --  3.5 4.1 4.1  CL 96* 100  --  103 101  --   CO2 26 24  --  21* 21*  --   GLUCOSE 119* 108*  --  132* 109*  --   BUN 12 8  --  11 16  --   CREATININE 0.78 0.87  --  0.73 0.68  --   CALCIUM 7.8* 7.9*  --  7.4* 7.9*  --   MG 2.0  --   --  2.1  --  1.9  PHOS  --   --  1.9* 2.0*  --  2.8    ABG: No results for input(s): PHART, PCO2ART, PO2ART, HCO3, O2SAT in the last 168 hours.  Liver Function Tests: Recent Labs  Lab 06/04/20 0832 06/07/20 0609  AST 23 19  ALT 17 16  ALKPHOS 52 48  BILITOT 1.1 0.7  PROT 5.8* 5.4*  ALBUMIN 2.3* 2.1*   No results for input(s): LIPASE, AMYLASE in the last 168 hours. No results for input(s): AMMONIA in the last 168 hours.  CBC: Recent Labs  Lab 06/09/2020 0438 06/04/20 0832  06/05/20 0716 06/07/20 0609  WBC 11.2* 13.3* 14.4* 9.6  NEUTROABS  --  11.1*  --   --   HGB 10.5* 11.4* 10.7* 10.4*  HCT 30.0* 31.9* 29.9* 29.4*  MCV 82.2 81.0 81.9 80.8  PLT 180 200 189 204    Cardiac Enzymes: No results for input(s): CKTOTAL, CKMB, CKMBINDEX, TROPONINI in the last 168 hours.  BNP (last 3 results) Recent Labs    05/21/20 1858  BNP 284.5*    ProBNP (last 3 results) No results for input(s): PROBNP in the last 8760 hours.  Radiological Exams: DG Chest Port 1 View  Result Date: 06/08/2020 CLINICAL DATA:  PICC line placement EXAM: PORTABLE CHEST 1 VIEW COMPARISON:  06/04/2020 FINDINGS: Right PICC line is been placed with the tip in the right atrium approximately 4 cm deep to the cavoatrial junction. Tracheostomy is unchanged. Interstitial lung disease again noted, unchanged. Heart is normal size. IMPRESSION: Right PICC line tip in the right atrium approximately 4 cm deep to the cavoatrial junction. Electronically Signed   By: Rolm Baptise M.D.   On: 06/08/2020 18:08   DG Abd Portable  1V  Result Date: 06/08/2020 CLINICAL DATA:  Abdominal ileus. EXAM: PORTABLE ABDOMEN - 1 VIEW COMPARISON:  06/05/2020 FINDINGS: Minimal residual contrast is noted in the colon. Persistent diffuse air-filled small bowel and colon consistent with a diffuse ileus. Feeding gastrostomy tube is again noted. Bibasilar atelectasis. IMPRESSION: Persistent diffuse ileus bowel gas pattern. Electronically Signed   By: Marijo Sanes M.D.   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 Acute on chronic respiratory failure hypoxia we will continue to titrate aerosol trach collar as tolerated.  Continue PMV use.  Continue supportive measures and pulmonary toilet. 2. Chronic atrial fibrillation  rate controlled 3. Severe COPD at baseline we will continue with supportive care 4. 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 Pulmonary fibrosis supportive care   I have personally seen and evaluated the patient, evaluated laboratory and imaging results, formulated the assessment and plan and placed orders. The Patient requires high complexity decision making with multiple systems involvement.  Rounds were done with the Respiratory Therapy Director and Staff therapists and discussed with nursing staff also.  Allyne Gee, MD [facility] Pulmonary Critical Care Medicine Sleep Medicine

## 2020-06-10 ENCOUNTER — Ambulatory Visit: Payer: Medicare Other

## 2020-06-10 ENCOUNTER — Other Ambulatory Visit (HOSPITAL_COMMUNITY): Payer: Self-pay

## 2020-06-10 DIAGNOSIS — I482 Chronic atrial fibrillation, unspecified: Secondary | ICD-10-CM | POA: Diagnosis not present

## 2020-06-10 DIAGNOSIS — J9621 Acute and chronic respiratory failure with hypoxia: Secondary | ICD-10-CM | POA: Diagnosis not present

## 2020-06-10 DIAGNOSIS — J8410 Pulmonary fibrosis, unspecified: Secondary | ICD-10-CM | POA: Diagnosis not present

## 2020-06-10 DIAGNOSIS — J449 Chronic obstructive pulmonary disease, unspecified: Secondary | ICD-10-CM | POA: Diagnosis not present

## 2020-06-10 LAB — MAGNESIUM: Magnesium: 1.9 mg/dL (ref 1.7–2.4)

## 2020-06-10 LAB — POTASSIUM: Potassium: 4 mmol/L (ref 3.5–5.1)

## 2020-06-10 NOTE — Progress Notes (Signed)
Pulmonary [place]   PULMONARY CRITICAL CARE SERVICE  PROGRESS NOTE  Date of Service: 06/10/2020  [name]  [id number]  DOB: [birth year]   DOA: 06/07/2020  Referring Physician: Merton Border, MD  HPI: [name] is a 79 y.o. male seen for follow up of Acute on Chronic Respiratory Failure.  Patient currently is on T collar has been on 28% FiO2 good saturations are noted.  Secretions are fair to moderate  Medications: Reviewed on Rounds  Physical Exam:  Vitals: Temperature is 96.8 pulse 85 respiratory rate is 21 blood pressure is 105/50 saturations 96%  Ventilator Settings on T collar FiO2 of 28%  . General: Comfortable at this time . Eyes: Grossly normal lids, irises & conjunctiva . ENT: grossly tongue is normal . Neck: no obvious mass . Cardiovascular: S1 S2 normal no gallop . Respiratory: No rhonchi coarse breath sounds . Abdomen: soft . Skin: no rash seen on limited exam . Musculoskeletal: not rigid . Psychiatric:unable to assess . Neurologic: no seizure no involuntary movements         Lab Data:   Basic Metabolic Panel: Recent Labs  Lab 06/04/20 0832 06/04/20 1455 06/05/20 0716 06/07/20 0609 06/08/20 0447 06/10/20 0519  NA 134*  --  131* 130*  --   --   K 3.6  --  3.5 4.1 4.1 4.0  CL 100  --  103 101  --   --   CO2 24  --  21* 21*  --   --   GLUCOSE 108*  --  132* 109*  --   --   BUN 8  --  11 16  --   --   CREATININE 0.87  --  0.73 0.68  --   --   CALCIUM 7.9*  --  7.4* 7.9*  --   --   MG  --   --  2.1  --  1.9 1.9  PHOS  --  1.9* 2.0*  --  2.8  --     ABG: No results for input(s): PHART, PCO2ART, PO2ART, HCO3, O2SAT in the last 168 hours.  Liver Function Tests: Recent Labs  Lab 06/04/20 0832 06/07/20 0609  AST 23 19  ALT 17 16  ALKPHOS 52 48  BILITOT 1.1 0.7  PROT 5.8* 5.4*  ALBUMIN 2.3* 2.1*   No results for input(s): LIPASE, AMYLASE in the last 168 hours. No results for input(s):  AMMONIA in the last 168 hours.  CBC: Recent Labs  Lab 06/04/20 0832 06/05/20 0716 06/07/20 0609  WBC 13.3* 14.4* 9.6  NEUTROABS 11.1*  --   --   HGB 11.4* 10.7* 10.4*  HCT 31.9* 29.9* 29.4*  MCV 81.0 81.9 80.8  PLT 200 189 204    Cardiac Enzymes: No results for input(s): CKTOTAL, CKMB, CKMBINDEX, TROPONINI in the last 168 hours.  BNP (last 3 results) Recent Labs    05/21/20 1858  BNP 284.5*    ProBNP (last 3 results) No results for input(s): PROBNP in the last 8760 hours.  Radiological Exams: DG Abd Portable 1V  Result Date: 06/10/2020 CLINICAL DATA:  Ileus. EXAM: PORTABLE ABDOMEN - 1 VIEW COMPARISON:  One-view abdomen 06/08/2020 FINDINGS: Mild bibasilar airspace opacities are again noted. Dilated loops of small bowel are present throughout the abdomen. Contrast is present at the rectum. Gas is noted in the colon. IMPRESSION: 1. Dilated loops of small bowel throughout the abdomen compatible with ileus, not significantly changed. 2. Gas and contrast in the  distal colon and rectum. Electronically Signed   By: San Morelle M.D.   On: 06/10/2020 07:11    Assessment/Plan Active Problems:   Acute on chronic respiratory failure with hypoxia (HCC)   Chronic atrial fibrillation (HCC)   COPD, severe (HCC)   Squamous cell carcinoma - supraglottic mass   Pulmonary fibrosis (HCC)   1. Acute on chronic respiratory failure with hypoxia plan is to continue with the T collar use PMV also as tolerated. 2. Chronic atrial fibrillation rate is controlled we will continue to monitor closely. 3. Severe COPD medical management 4. Squamous cell carcinoma patient has tracheostomy in place no plan on decannulation 5. Pulmonary fibrosis supportive care   I have personally seen and evaluated the patient, evaluated laboratory and imaging results, formulated the assessment and plan and placed orders. The Patient requires high complexity decision making with multiple systems involvement.   Rounds were done with the Respiratory Therapy Director and Staff therapists and discussed with nursing staff also.  Allyne Gee, MD [facility] Pulmonary Critical Care Medicine Sleep Medicine

## 2020-06-11 ENCOUNTER — Ambulatory Visit: Payer: Medicare Other

## 2020-06-11 DIAGNOSIS — J9621 Acute and chronic respiratory failure with hypoxia: Secondary | ICD-10-CM | POA: Diagnosis not present

## 2020-06-11 DIAGNOSIS — J8410 Pulmonary fibrosis, unspecified: Secondary | ICD-10-CM | POA: Diagnosis not present

## 2020-06-11 DIAGNOSIS — J449 Chronic obstructive pulmonary disease, unspecified: Secondary | ICD-10-CM | POA: Diagnosis not present

## 2020-06-11 DIAGNOSIS — I482 Chronic atrial fibrillation, unspecified: Secondary | ICD-10-CM | POA: Diagnosis not present

## 2020-06-11 LAB — PHOSPHORUS: Phosphorus: 2.5 mg/dL (ref 2.5–4.6)

## 2020-06-11 LAB — CULTURE, RESPIRATORY W GRAM STAIN

## 2020-06-11 LAB — MAGNESIUM: Magnesium: 1.8 mg/dL (ref 1.7–2.4)

## 2020-06-11 LAB — TRIGLYCERIDES: Triglycerides: 53 mg/dL (ref <150)

## 2020-06-11 NOTE — Progress Notes (Addendum)
Pulmonary [place]   PULMONARY CRITICAL CARE SERVICE  PROGRESS NOTE  Date of Service: 06/11/2020  [name]  CZY:[id number]  DOB: 19-Dec-1940   DOA: 06/09/2020  Referring Physician: Merton Border, MD  HPI: [name] is a 79 y.o. male seen for follow up of Acute on Chronic Respiratory Failure.  Patient remains on 28% aerosol trach collar satting well no fever or distress.  Medications: Reviewed on Rounds  Physical Exam:  Vitals: Pulse 75 respirations 19 BP 110/52 O2 sat 99% temp 96.7  Ventilator Settings 28% ATC  . General: Comfortable at this time . Eyes: Grossly normal lids, irises & conjunctiva . ENT: grossly tongue is normal . Neck: no obvious mass . Cardiovascular: S1 S2 normal no gallop . Respiratory: No rales or rhonchi noted . Abdomen: soft . Skin: no rash seen on limited exam . Musculoskeletal: not rigid . Psychiatric:unable to assess . Neurologic: no seizure no involuntary movements         Lab Data:   Basic Metabolic Panel: Recent Labs  Lab 06/05/20 0716 06/07/20 0609 06/08/20 0447 06/10/20 0519 06/11/20 0354 06/11/20 0543  NA 131* 130*  --   --   --   --   K 3.5 4.1 4.1 4.0  --   --   CL 103 101  --   --   --   --   CO2 21* 21*  --   --   --   --   GLUCOSE 132* 109*  --   --   --   --   BUN 11 16  --   --   --   --   CREATININE 0.73 0.68  --   --   --   --   CALCIUM 7.4* 7.9*  --   --   --   --   MG 2.1  --  1.9 1.9 1.8  --   PHOS 2.0*  --  2.8  --   --  2.5    ABG: No results for input(s): PHART, PCO2ART, PO2ART, HCO3, O2SAT in the last 168 hours.  Liver Function Tests: Recent Labs  Lab 06/07/20 0609  AST 19  ALT 16  ALKPHOS 48  BILITOT 0.7  PROT 5.4*  ALBUMIN 2.1*   No results for input(s): LIPASE, AMYLASE in the last 168 hours. No results for input(s): AMMONIA in the last 168 hours.  CBC: Recent Labs  Lab 06/05/20 0716 06/07/20 0609  WBC 14.4* 9.6  HGB 10.7* 10.4*  HCT  29.9* 29.4*  MCV 81.9 80.8  PLT 189 204    Cardiac Enzymes: No results for input(s): CKTOTAL, CKMB, CKMBINDEX, TROPONINI in the last 168 hours.  BNP (last 3 results) Recent Labs    05/21/20 1858  BNP 284.5*    ProBNP (last 3 results) No results for input(s): PROBNP in the last 8760 hours.  Radiological Exams: DG Abd Portable 1V  Result Date: 06/10/2020 CLINICAL DATA:  Ileus. EXAM: PORTABLE ABDOMEN - 1 VIEW COMPARISON:  One-view abdomen 06/08/2020 FINDINGS: Mild bibasilar airspace opacities are again noted. Dilated loops of small bowel are present throughout the abdomen. Contrast is present at the rectum. Gas is noted in the colon. IMPRESSION: 1. Dilated loops of small bowel throughout the abdomen compatible with ileus, not significantly changed. 2. Gas and contrast in the distal colon and rectum. Electronically Signed   By: San Morelle M.D.   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 Acute on chronic respiratory failure with hypoxia plan is to continue with the T collar use PMV also as tolerated. 2. Chronic atrial fibrillation rate is controlled we will continue to monitor closely. 3. Severe COPD medical management 4. Squamous cell carcinoma patient has tracheostomy in place no plan on decannulation 5. Pulmonary fibrosis supportive care   I have personally seen and evaluated the patient, evaluated laboratory and imaging results, formulated the assessment and plan and placed orders. The Patient requires high complexity decision making with multiple systems involvement.  Rounds were done with the Respiratory Therapy Director and Staff therapists and discussed with nursing staff also.  Allyne Gee, MD [facility] Pulmonary Critical Care Medicine Sleep Medicine

## 2020-06-12 ENCOUNTER — Other Ambulatory Visit (HOSPITAL_COMMUNITY): Payer: Self-pay

## 2020-06-12 DIAGNOSIS — J8410 Pulmonary fibrosis, unspecified: Secondary | ICD-10-CM | POA: Diagnosis not present

## 2020-06-12 DIAGNOSIS — I482 Chronic atrial fibrillation, unspecified: Secondary | ICD-10-CM | POA: Diagnosis not present

## 2020-06-12 DIAGNOSIS — J449 Chronic obstructive pulmonary disease, unspecified: Secondary | ICD-10-CM | POA: Diagnosis not present

## 2020-06-12 DIAGNOSIS — J9621 Acute and chronic respiratory failure with hypoxia: Secondary | ICD-10-CM | POA: Diagnosis not present

## 2020-06-12 LAB — BASIC METABOLIC PANEL
Anion gap: 9 (ref 5–15)
BUN: 15 mg/dL (ref 8–23)
CO2: 21 mmol/L — ABNORMAL LOW (ref 22–32)
Calcium: 7.9 mg/dL — ABNORMAL LOW (ref 8.9–10.3)
Chloride: 102 mmol/L (ref 98–111)
Creatinine, Ser: 0.74 mg/dL (ref 0.61–1.24)
GFR calc Af Amer: >60 mL/min (ref >60)
GFR calc non Af Amer: >60 mL/min (ref >60)
Glucose, Bld: 115 mg/dL — ABNORMAL HIGH (ref 70–99)
Potassium: 4.1 mmol/L (ref 3.5–5.1)
Sodium: 132 mmol/L — ABNORMAL LOW (ref 135–145)

## 2020-06-12 LAB — MAGNESIUM: Magnesium: 2 mg/dL (ref 1.7–2.4)

## 2020-06-12 LAB — CBC
HCT: 25.1 % — ABNORMAL LOW (ref 39.0–52.0)
Hemoglobin: 8.9 g/dL — ABNORMAL LOW (ref 13.0–17.0)
MCH: 29.1 pg (ref 26.0–34.0)
MCHC: 35.5 g/dL (ref 30.0–36.0)
MCV: 82 fL (ref 80.0–100.0)
Platelets: 203 10*3/uL (ref 150–400)
RBC: 3.06 MIL/uL — ABNORMAL LOW (ref 4.22–5.81)
RDW: 14.1 % (ref 11.5–15.5)
WBC: 10.4 10*3/uL (ref 4.0–10.5)
nRBC: 0 % (ref 0.0–0.2)

## 2020-06-12 NOTE — Progress Notes (Signed)
Pulmonary [place]   PULMONARY CRITICAL CARE SERVICE  PROGRESS NOTE  Date of Service: 06/12/2020  Daymien Goth  OZD:[id number]  DOB: 10-02-1941   DOA: 06/14/2020  Referring Physician: Merton Border, MD  HPI: Taylen Osorto is a 79 y.o. male seen for follow up of Acute on Chronic Respiratory Failure.  Patient is on T collar now on 28% FiO2 has not yet been attempted at capping spoke with respiratory therapy to try to make an attempt to capping  Medications: Reviewed on Rounds  Physical Exam:  Vitals: Temperature 97.4 pulse 73 respiratory 16 blood pressure is 98/61 saturations 98%  Ventilator Settings on T collar with an FiO2 of 28%  . General: Comfortable at this time . Eyes: Grossly normal lids, irises & conjunctiva . ENT: grossly tongue is normal . Neck: no obvious mass . Cardiovascular: S1 S2 normal no gallop . Respiratory: No rhonchi very coarse breath sounds . Abdomen: soft . Skin: no rash seen on limited exam . Musculoskeletal: not rigid . Psychiatric:unable to assess . Neurologic: no seizure no involuntary movements         Lab Data:   Basic Metabolic Panel: Recent Labs  Lab 06/07/20 0609 06/08/20 0447 06/10/20 0519 06/11/20 0354 06/11/20 0543 06/12/20 0613  NA 130*  --   --   --   --  132*  K 4.1 4.1 4.0  --   --  4.1  CL 101  --   --   --   --  102  CO2 21*  --   --   --   --  21*  GLUCOSE 109*  --   --   --   --  115*  BUN 16  --   --   --   --  15  CREATININE 0.68  --   --   --   --  0.74  CALCIUM 7.9*  --   --   --   --  7.9*  MG  --  1.9 1.9 1.8  --  2.0  PHOS  --  2.8  --   --  2.5  --     ABG: No results for input(s): PHART, PCO2ART, PO2ART, HCO3, O2SAT in the last 168 hours.  Liver Function Tests: Recent Labs  Lab 06/07/20 0609  AST 19  ALT 16  ALKPHOS 48  BILITOT 0.7  PROT 5.4*  ALBUMIN 2.1*   No results for input(s): LIPASE, AMYLASE in the last 168 hours. No results for input(s):  AMMONIA in the last 168 hours.  CBC: Recent Labs  Lab 06/07/20 0609 06/12/20 0613  WBC 9.6 10.4  HGB 10.4* 8.9*  HCT 29.4* 25.1*  MCV 80.8 82.0  PLT 204 203    Cardiac Enzymes: No results for input(s): CKTOTAL, CKMB, CKMBINDEX, TROPONINI in the last 168 hours.  BNP (last 3 results) Recent Labs    05/21/20 1858  BNP 284.5*    ProBNP (last 3 results) No results for input(s): PROBNP in the last 8760 hours.  Radiological Exams: DG Abd Portable 1V  Result Date: 06/12/2020 CLINICAL DATA:  Follow-up ileus EXAM: PORTABLE ABDOMEN - 1 VIEW COMPARISON:  06/10/2020 FINDINGS: Scattered large and small bowel gas is noted. The overall appearance is stable from the prior exam. Gastrostomy catheter is noted in place. Prior T tack fixation device has migrated distally. No free air is noted. IMPRESSION: Scattered large and small bowel gas compatible with diffuse ileus. The overall appearance is stable. Electronically  Signed   By: Inez Catalina M.D.   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 Acute on chronic respiratory failure hypoxia plan is to continue with the T collar trials but today we will try for capping if he does well we will continue to advance 2. Chronic atrial fibrillation rate controlled we will continue to monitor 3. Severe COPD at baseline 4. Squamous cell carcinoma we will continue present management expect tracheostomy tube remain in place for now but will try capping 5. Pulmonary fibrosis no change   I have personally seen and evaluated the patient, evaluated laboratory and imaging results, formulated the assessment and plan and placed orders. The Patient requires high complexity decision making with multiple systems involvement.  Rounds were done with the  Respiratory Therapy Director and Staff therapists and discussed with nursing staff also.  Allyne Gee, MD [facility] Pulmonary Critical Care Medicine Sleep Medicine

## 2020-06-13 DIAGNOSIS — J8410 Pulmonary fibrosis, unspecified: Secondary | ICD-10-CM | POA: Diagnosis not present

## 2020-06-13 DIAGNOSIS — J9621 Acute and chronic respiratory failure with hypoxia: Secondary | ICD-10-CM | POA: Diagnosis not present

## 2020-06-13 DIAGNOSIS — J449 Chronic obstructive pulmonary disease, unspecified: Secondary | ICD-10-CM | POA: Diagnosis not present

## 2020-06-13 DIAGNOSIS — I482 Chronic atrial fibrillation, unspecified: Secondary | ICD-10-CM | POA: Diagnosis not present

## 2020-06-13 NOTE — Progress Notes (Signed)
Pulmonary [place]   PULMONARY CRITICAL CARE SERVICE  PROGRESS NOTE  Date of Service: 06/13/2020  [name]  OQH:[id number]  DOB: 1940/12/09   DOA: 06/23/2020  Referring Physician: Merton Border, MD  HPI: [name] is a 79 y.o. male seen for follow up of Acute on Chronic Respiratory Failure.  Patient currently is on T collar yesterday was attempted at capping he did not tolerate it  Medications: Reviewed on Rounds  Physical Exam:  Vitals: Temperature 99.8 pulse 102 respiratory 28 blood pressure is 122/63 saturations 98%  Ventilator Settings on T collar right now  . General: Comfortable at this time . Eyes: Grossly normal lids, irises & conjunctiva . ENT: grossly tongue is normal . Neck: no obvious mass . Cardiovascular: S1 S2 normal no gallop . Respiratory: No rhonchi no rales are noted . Abdomen: soft . Skin: no rash seen on limited exam . Musculoskeletal: not rigid . Psychiatric:unable to assess . Neurologic: no seizure no involuntary movements         Lab Data:   Basic Metabolic Panel: Recent Labs  Lab 06/07/20 0609 06/08/20 0447 06/10/20 0519 06/11/20 0354 06/11/20 0543 06/12/20 0613  NA 130*  --   --   --   --  132*  K 4.1 4.1 4.0  --   --  4.1  CL 101  --   --   --   --  102  CO2 21*  --   --   --   --  21*  GLUCOSE 109*  --   --   --   --  115*  BUN 16  --   --   --   --  15  CREATININE 0.68  --   --   --   --  0.74  CALCIUM 7.9*  --   --   --   --  7.9*  MG  --  1.9 1.9 1.8  --  2.0  PHOS  --  2.8  --   --  2.5  --     ABG: No results for input(s): PHART, PCO2ART, PO2ART, HCO3, O2SAT in the last 168 hours.  Liver Function Tests: Recent Labs  Lab 06/07/20 0609  AST 19  ALT 16  ALKPHOS 48  BILITOT 0.7  PROT 5.4*  ALBUMIN 2.1*   No results for input(s): LIPASE, AMYLASE in the last 168 hours. No results for input(s): AMMONIA in the last 168 hours.  CBC: Recent Labs  Lab  06/07/20 0609 06/12/20 0613  WBC 9.6 10.4  HGB 10.4* 8.9*  HCT 29.4* 25.1*  MCV 80.8 82.0  PLT 204 203    Cardiac Enzymes: No results for input(s): CKTOTAL, CKMB, CKMBINDEX, TROPONINI in the last 168 hours.  BNP (last 3 results) Recent Labs    05/21/20 1858  BNP 284.5*    ProBNP (last 3 results) No results for input(s): PROBNP in the last 8760 hours.  Radiological Exams: DG Abd Portable 1V  Result Date: 06/12/2020 CLINICAL DATA:  Follow-up ileus EXAM: PORTABLE ABDOMEN - 1 VIEW COMPARISON:  06/10/2020 FINDINGS: Scattered large and small bowel gas is noted. The overall appearance is stable from the prior exam. Gastrostomy catheter is noted in place. Prior T tack fixation device has migrated distally. No free air is noted. IMPRESSION: Scattered large and small bowel gas compatible with diffuse ileus. The overall appearance is stable. Electronically Signed   By: Inez Catalina M.D.   On: 06/12/2020 07:52  Assessment/Plan Active Problems:   Acute on chronic respiratory failure with hypoxia (HCC)   Chronic atrial fibrillation (HCC)   COPD, severe (HCC)   Squamous cell carcinoma - supraglottic mass   Pulmonary fibrosis (HCC)   1. Acute on chronic respiratory failure hypoxia we will continue with the T collar as ordered continue secretion management supportive care.  Hold off on capping use PMV as necessary 2. Chronic atrial fibrillation rate is controlled we will continue to monitor. 3. Severe COPD medical management 4. Squamous cell carcinoma supraglottic mass patient had a tracheostomy for this purpose 5. Pulmonary fibrosis no change continue with supportive care   I have personally seen and evaluated the patient, evaluated laboratory and imaging results, formulated the assessment and plan and placed orders. The Patient requires high complexity decision making with multiple systems involvement.  Rounds were done with the Respiratory Therapy Director and Staff therapists and  discussed with nursing staff also.  Allyne Gee, MD [facility] Pulmonary Critical Care Medicine Sleep Medicine

## 2020-06-14 ENCOUNTER — Ambulatory Visit: Payer: Medicare Other

## 2020-06-14 DIAGNOSIS — J8410 Pulmonary fibrosis, unspecified: Secondary | ICD-10-CM | POA: Diagnosis not present

## 2020-06-14 DIAGNOSIS — I482 Chronic atrial fibrillation, unspecified: Secondary | ICD-10-CM | POA: Diagnosis not present

## 2020-06-14 DIAGNOSIS — J9621 Acute and chronic respiratory failure with hypoxia: Secondary | ICD-10-CM | POA: Diagnosis not present

## 2020-06-14 DIAGNOSIS — J449 Chronic obstructive pulmonary disease, unspecified: Secondary | ICD-10-CM | POA: Diagnosis not present

## 2020-06-14 LAB — BASIC METABOLIC PANEL
Anion gap: 7 (ref 5–15)
BUN: 15 mg/dL (ref 8–23)
CO2: 24 mmol/L (ref 22–32)
Calcium: 7.8 mg/dL — ABNORMAL LOW (ref 8.9–10.3)
Chloride: 99 mmol/L (ref 98–111)
Creatinine, Ser: 0.71 mg/dL (ref 0.61–1.24)
GFR calc Af Amer: >60 mL/min (ref >60)
GFR calc non Af Amer: >60 mL/min (ref >60)
Glucose, Bld: 124 mg/dL — ABNORMAL HIGH (ref 70–99)
Potassium: 4.5 mmol/L (ref 3.5–5.1)
Sodium: 130 mmol/L — ABNORMAL LOW (ref 135–145)

## 2020-06-14 LAB — CBC
HCT: 26.2 % — ABNORMAL LOW (ref 39.0–52.0)
Hemoglobin: 9 g/dL — ABNORMAL LOW (ref 13.0–17.0)
MCH: 28 pg (ref 26.0–34.0)
MCHC: 34.4 g/dL (ref 30.0–36.0)
MCV: 81.6 fL (ref 80.0–100.0)
Platelets: 205 10*3/uL (ref 150–400)
RBC: 3.21 MIL/uL — ABNORMAL LOW (ref 4.22–5.81)
RDW: 14.2 % (ref 11.5–15.5)
WBC: 9.1 10*3/uL (ref 4.0–10.5)
nRBC: 0 % (ref 0.0–0.2)

## 2020-06-14 LAB — PHOSPHORUS: Phosphorus: 2.7 mg/dL (ref 2.5–4.6)

## 2020-06-14 LAB — MAGNESIUM: Magnesium: 1.9 mg/dL (ref 1.7–2.4)

## 2020-06-14 NOTE — Progress Notes (Signed)
Pulmonary [place]   PULMONARY CRITICAL CARE SERVICE  PROGRESS NOTE  Date of Service: 06/14/2020  [name]  BUL:[id number]  DOB: Jun 26, 1941   DOA: 06/16/2020  Referring Physician: Merton Border, MD  HPI: [name] is a 79 y.o. male seen for follow up of Acute on Chronic Respiratory Failure.  Patient at this time is on T collar currently on 28% FiO2 good saturations are noted  Medications: Reviewed on Rounds  Physical Exam:  Vitals: Temperature 98.4 pulse 92 respiratory 19 blood pressure is 103/56 saturations 97%  Ventilator Settings on T collar with an FiO2 of 28%  . General: Comfortable at this time . Eyes: Grossly normal lids, irises & conjunctiva . ENT: grossly tongue is normal . Neck: no obvious mass . Cardiovascular: S1 S2 normal no gallop . Respiratory: No rhonchi no rales are noted at this time . Abdomen: soft . Skin: no rash seen on limited exam . Musculoskeletal: not rigid . Psychiatric:unable to assess . Neurologic: no seizure no involuntary movements         Lab Data:   Basic Metabolic Panel: Recent Labs  Lab 06/08/20 0447 06/10/20 0519 06/11/20 0354 06/11/20 0543 06/12/20 0613 06/14/20 0100 06/14/20 0456  NA  --   --   --   --  132*  --  130*  K 4.1 4.0  --   --  4.1  --  4.5  CL  --   --   --   --  102  --  99  CO2  --   --   --   --  21*  --  24  GLUCOSE  --   --   --   --  115*  --  124*  BUN  --   --   --   --  15  --  15  CREATININE  --   --   --   --  0.74  --  0.71  CALCIUM  --   --   --   --  7.9*  --  7.8*  MG 1.9 1.9 1.8  --  2.0 1.9  --   PHOS 2.8  --   --  2.5  --   --  2.7    ABG: No results for input(s): PHART, PCO2ART, PO2ART, HCO3, O2SAT in the last 168 hours.  Liver Function Tests: No results for input(s): AST, ALT, ALKPHOS, BILITOT, PROT, ALBUMIN in the last 168 hours. No results for input(s): LIPASE, AMYLASE in the last 168 hours. No results for input(s): AMMONIA in  the last 168 hours.  CBC: Recent Labs  Lab 06/12/20 0613 06/14/20 0456  WBC 10.4 9.1  HGB 8.9* 9.0*  HCT 25.1* 26.2*  MCV 82.0 81.6  PLT 203 205    Cardiac Enzymes: No results for input(s): CKTOTAL, CKMB, CKMBINDEX, TROPONINI in the last 168 hours.  BNP (last 3 results) Recent Labs    05/21/20 1858  BNP 284.5*    ProBNP (last 3 results) No results for input(s): PROBNP in the last 8760 hours.  Radiological Exams: No results found.  Assessment/Plan Active Problems:   Acute on chronic respiratory failure with hypoxia (HCC)   Chronic atrial fibrillation (HCC)   COPD, severe (HCC)   Squamous cell carcinoma - supraglottic mass   Pulmonary fibrosis (HCC)   1. Acute on chronic respiratory failure hypoxia continue with T collar trials titrate oxygen continue pulmonary toilet. 2. Chronic atrial fibrillation rate is controlled we  will continue to monitor telemetry 3. Severe COPD at baseline medical management nebulizers as necessary 4. Squamous cell carcinoma patient is not a candidate for decannulation secondary to the cancer events of the laryngeal area 5. Pulmonary fibrosis supportive care   I have personally seen and evaluated the patient, evaluated laboratory and imaging results, formulated the assessment and plan and placed orders. The Patient requires high complexity decision making with multiple systems involvement.  Rounds were done with the Respiratory Therapy Director and Staff therapists and discussed with nursing staff also.  Allyne Gee, MD [facility] Pulmonary Critical Care Medicine Sleep Medicine

## 2020-06-15 ENCOUNTER — Other Ambulatory Visit (HOSPITAL_COMMUNITY): Payer: Self-pay

## 2020-06-15 ENCOUNTER — Ambulatory Visit: Payer: Medicare Other | Admitting: Radiation Oncology

## 2020-06-15 DIAGNOSIS — J9621 Acute and chronic respiratory failure with hypoxia: Secondary | ICD-10-CM | POA: Diagnosis not present

## 2020-06-15 DIAGNOSIS — I482 Chronic atrial fibrillation, unspecified: Secondary | ICD-10-CM | POA: Diagnosis not present

## 2020-06-15 DIAGNOSIS — J449 Chronic obstructive pulmonary disease, unspecified: Secondary | ICD-10-CM | POA: Diagnosis not present

## 2020-06-15 DIAGNOSIS — J8410 Pulmonary fibrosis, unspecified: Secondary | ICD-10-CM | POA: Diagnosis not present

## 2020-06-15 NOTE — Progress Notes (Addendum)
Pulmonary [place]   PULMONARY CRITICAL CARE SERVICE  PROGRESS NOTE  Date of Service: 06/15/2020  [name]  [id number]  DOB: 1941/05/07   DOA: 06/11/2020  Referring Physician: Merton Border, MD  HPI: [name] is a 79 y.o. male seen for follow up of Acute on Chronic Respiratory Failure.  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  Satting well so no distress.  Medications: Reviewed on Rounds  Physical Exam:  Vitals: Pulse 110 respirations 22 BP one 1/46 O2 sat 98% temp 94.6  Ventilator Settings 28% ATC  . General: Comfortable at this time . Eyes: Grossly normal lids, irises & conjunctiva . ENT: grossly tongue is normal . Neck: no obvious mass . Cardiovascular: S1 S2 normal no gallop . Respiratory: No rales or rhonchi noted . Abdomen: soft . Skin: no rash seen on limited exam . Musculoskeletal: not rigid . Psychiatric:unable to assess . Neurologic: no seizure no involuntary movements         Lab Data:   Basic Metabolic Panel: Recent Labs  Lab 06/10/20 0519 06/11/20 0354 06/11/20 0543 06/12/20 0613 06/14/20 0100 06/14/20 0456  NA  --   --   --  132*  --  130*  K 4.0  --   --  4.1  --  4.5  CL  --   --   --  102  --  99  CO2  --   --   --  21*  --  24  GLUCOSE  --   --   --  115*  --  124*  BUN  --   --   --  15  --  15  CREATININE  --   --   --  0.74  --  0.71  CALCIUM  --   --   --  7.9*  --  7.8*  MG 1.9 1.8  --  2.0 1.9  --   PHOS  --   --  2.5  --   --  2.7    ABG: No results for input(s): PHART, PCO2ART, PO2ART, HCO3, O2SAT in the last 168 hours.  Liver Function Tests: No results for input(s): AST, ALT, ALKPHOS, BILITOT, PROT, ALBUMIN in the last 168 hours. No results for input(s): LIPASE, AMYLASE in the last 168 hours. No results for input(s): AMMONIA in the last 168 hours.  CBC: Recent  Labs  Lab 06/12/20 0613 06/14/20 0456  WBC 10.4 9.1  HGB 8.9* 9.0*  HCT 25.1* 26.2*  MCV 82.0 81.6  PLT 203 205    Cardiac Enzymes: No results for input(s): CKTOTAL, CKMB, CKMBINDEX, TROPONINI in the last 168 hours.  BNP (last 3 results) Recent Labs    05/21/20 1858  BNP 284.5*    ProBNP (last 3 results) No results for input(s): PROBNP in the last 8760 hours.  Radiological Exams: DG Abd 1 View  Result Date: 06/15/2020 CLINICAL DATA:  Ileus. EXAM: ABDOMEN - 1 VIEW COMPARISON:  06/12/2020.  06/10/2020.  06/08/2020. FINDINGS: Gastrostomy tube noted in stable position. Metallic fixation device continues to migrate distally, it now appears over the rectum. Diffuse small and large bowel prominence again noted consistent adynamic ileus. No interim change. Follow-up exams to demonstrate resolution and to exclude bowel obstruction suggested. Aortoiliac atherosclerotic vascular calcification. IMPRESSION: 1. Gastrostomy tube in stable position. Metallic fixation device continues to migrate distally, and now appears over  the rectum. 2. Diffuse small and large bowel prominence again noted consistent adynamic ileus. No interim change. 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 Acute on chronic respiratory failure hypoxia we will continue with T-bar at this time may titrate oxygen as patient tolerate.  Appears to be at baseline 28%.  Continue supportive measures pulmonary toilet. 2. Chronic atrial fibrillation rate is controlled we will continue to monitor telemetry 3. Severe COPD at baseline medical management nebulizers as necessary 4. Squamous cell carcinoma patient is not a candidate for decannulation secondary to the cancer events of  the laryngeal area 5. Pulmonary fibrosis supportive care   I have personally seen and evaluated the patient, evaluated laboratory and imaging results, formulated the assessment and plan and placed orders. The Patient requires high complexity decision making with multiple systems involvement.  Rounds were done with the Respiratory Therapy Director and Staff therapists and discussed with nursing staff also.  Allyne Gee, MD [facility] Pulmonary Critical Care Medicine Sleep Medicine

## 2020-06-16 ENCOUNTER — Other Ambulatory Visit (HOSPITAL_COMMUNITY): Payer: Self-pay

## 2020-06-16 ENCOUNTER — Ambulatory Visit: Payer: Medicare Other

## 2020-06-16 DIAGNOSIS — J449 Chronic obstructive pulmonary disease, unspecified: Secondary | ICD-10-CM | POA: Diagnosis not present

## 2020-06-16 DIAGNOSIS — J8410 Pulmonary fibrosis, unspecified: Secondary | ICD-10-CM | POA: Diagnosis not present

## 2020-06-16 DIAGNOSIS — I482 Chronic atrial fibrillation, unspecified: Secondary | ICD-10-CM | POA: Diagnosis not present

## 2020-06-16 DIAGNOSIS — J9621 Acute and chronic respiratory failure with hypoxia: Secondary | ICD-10-CM | POA: Diagnosis not present

## 2020-06-16 NOTE — Progress Notes (Addendum)
Pulmonary [name]   PULMONARY CRITICAL CARE SERVICE  PROGRESS NOTE  Date of Service: 06/16/2020  [name]  CBJ:[id number]  DOB: 04-06-1941   DOA: 06/22/2020  Referring Physician: Merton Border, MD  HPI: [name] is a 79 y.o. male seen for follow up of Acute on Chronic Respiratory Failure.  Patient remains on 50% aerosol trach collar at this time no distress noted.  Medications: Reviewed on Rounds  Physical Exam:  Vitals: Pulse 99 respirations 30 BP 91/66 O2 sat 2% temp 98.1  Ventilator Settings 50% ATC  . General: Comfortable at this time . Eyes: Grossly normal lids, irises & conjunctiva . ENT: grossly tongue is normal . Neck: no obvious mass . Cardiovascular: S1 S2 normal no gallop . Respiratory: Coarse breath sounds . Abdomen: soft . Skin: no rash seen on limited exam . Musculoskeletal: not rigid . Psychiatric:unable to assess . Neurologic: no seizure no involuntary movements         Lab Data:   Basic Metabolic Panel: Recent Labs  Lab 06/10/20 0519 06/11/20 0354 06/11/20 0543 06/12/20 0613 06/14/20 0100 06/14/20 0456  NA  --   --   --  132*  --  130*  K 4.0  --   --  4.1  --  4.5  CL  --   --   --  102  --  99  CO2  --   --   --  21*  --  24  GLUCOSE  --   --   --  115*  --  124*  BUN  --   --   --  15  --  15  CREATININE  --   --   --  0.74  --  0.71  CALCIUM  --   --   --  7.9*  --  7.8*  MG 1.9 1.8  --  2.0 1.9  --   PHOS  --   --  2.5  --   --  2.7    ABG: No results for input(s): PHART, PCO2ART, PO2ART, HCO3, O2SAT in the last 168 hours.  Liver Function Tests: No results for input(s): AST, ALT, ALKPHOS, BILITOT, PROT, ALBUMIN in the last 168 hours. No results for input(s): LIPASE, AMYLASE in the last 168 hours. No results for input(s): AMMONIA in the last 168 hours.  CBC: Recent Labs  Lab 06/12/20 0613 06/14/20 0456  WBC 10.4 9.1  HGB 8.9* 9.0*  HCT 25.1* 26.2*  MCV 82.0 81.6   PLT 203 205    Cardiac Enzymes: No results for input(s): CKTOTAL, CKMB, CKMBINDEX, TROPONINI in the last 168 hours.  BNP (last 3 results) Recent Labs    05/21/20 1858  BNP 284.5*    ProBNP (last 3 results) No results for input(s): PROBNP in the last 8760 hours.  Radiological Exams: DG Abd 1 View  Result Date: 06/15/2020 CLINICAL DATA:  Ileus. EXAM: ABDOMEN - 1 VIEW COMPARISON:  06/12/2020.  06/10/2020.  06/08/2020. FINDINGS: Gastrostomy tube noted in stable position. Metallic fixation device continues to migrate distally, it now appears over the rectum. Diffuse small and large bowel prominence again noted consistent adynamic ileus. No interim change. Follow-up exams to demonstrate resolution and to exclude bowel obstruction suggested. Aortoiliac atherosclerotic vascular calcification. IMPRESSION: 1. Gastrostomy tube in stable position. Metallic fixation device continues to migrate distally, and now appears over the rectum. 2. Diffuse small and large bowel prominence again noted consistent adynamic ileus. No interim change. Electronically Signed  By: [place]   On: 06/15/2020 06:29   DG CHEST PORT 1 VIEW  Result Date: 06/16/2020 CLINICAL DATA:  Respiratory failure.  Check trach placement EXAM: PORTABLE CHEST 1 VIEW COMPARISON:  06/08/2020 FINDINGS: Diffuse interstitial and airspace opacity. Tracheostomy tube in stable position. PICC with tip in good position. No visible effusion or pneumothorax. Extensive artifact from EKG leads. Stable heart size. IMPRESSION: Diffuse pulmonary opacity which has worsened from 06/08/2020 Electronically Signed   By: Monte Fantasia M.D.   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 Acute on chronic respiratory failure hypoxia  will  continue therapy trach collar 50% FiO2 at this time continue to attempt to wean back down to baseline 28%.  Continue supportive measures. 2. Chronic atrial fibrillation rate is controlled we will continue to monitor telemetry 3. Severe COPD at baseline medical management nebulizers as necessary 4. Squamous cell carcinoma patient is not a candidate for decannulation secondary to the cancer events of the laryngeal area 5. Pulmonary fibrosis supportive care   I have personally seen and evaluated the patient, evaluated laboratory and imaging results, formulated the assessment and plan and placed orders. The Patient requires high complexity decision making with multiple systems involvement.  Rounds were done with the Respiratory Therapy Director and Staff therapists and discussed with nursing staff also.  Allyne Gee, MD [facility] Pulmonary Critical Care Medicine Sleep Medicine

## 2020-06-16 NOTE — Telephone Encounter (Signed)
Thank you for the update on [name]; I will see him in Specialty and see what he wants to do.  GM

## 2020-06-17 ENCOUNTER — Ambulatory Visit: Payer: Medicare Other

## 2020-06-17 ENCOUNTER — Other Ambulatory Visit (HOSPITAL_COMMUNITY): Payer: Self-pay

## 2020-06-17 LAB — MAGNESIUM: Magnesium: 1.9 mg/dL (ref 1.7–2.4)

## 2020-06-18 ENCOUNTER — Ambulatory Visit: Payer: Medicare Other

## 2020-06-21 ENCOUNTER — Ambulatory Visit: Payer: Medicare Other

## 2020-06-22 ENCOUNTER — Ambulatory Visit: Payer: Medicare Other

## 2020-06-23 ENCOUNTER — Ambulatory Visit: Payer: Medicare Other

## 2020-06-24 ENCOUNTER — Ambulatory Visit: Payer: Medicare Other

## 2020-06-25 ENCOUNTER — Ambulatory Visit: Payer: Medicare Other

## 2020-06-28 ENCOUNTER — Ambulatory Visit: Payer: Medicare Other

## 2020-06-29 ENCOUNTER — Ambulatory Visit: Payer: Medicare Other

## 2020-06-30 ENCOUNTER — Ambulatory Visit: Payer: Medicare Other

## 2020-06-30 DEATH — deceased

## 2020-07-01 ENCOUNTER — Ambulatory Visit: Payer: Medicare Other

## 2020-07-02 ENCOUNTER — Ambulatory Visit: Payer: Medicare Other

## 2020-07-05 ENCOUNTER — Ambulatory Visit: Payer: Medicare Other

## 2020-07-06 ENCOUNTER — Ambulatory Visit: Payer: Medicare Other

## 2020-07-07 ENCOUNTER — Ambulatory Visit: Payer: Medicare Other

## 2020-07-08 ENCOUNTER — Ambulatory Visit: Payer: Medicare Other | Admitting: General Practice

## 2020-07-08 ENCOUNTER — Ambulatory Visit: Payer: Medicare Other

## 2020-07-09 ENCOUNTER — Ambulatory Visit: Payer: Medicare Other

## 2020-07-12 ENCOUNTER — Ambulatory Visit: Payer: Medicare Other | Admitting: Adult Health

## 2020-07-12 ENCOUNTER — Ambulatory Visit: Payer: Medicare Other

## 2020-07-13 ENCOUNTER — Ambulatory Visit: Payer: Medicare Other

## 2020-08-10 ENCOUNTER — Ambulatory Visit (HOSPITAL_COMMUNITY): Payer: Medicare Other | Admitting: Physician Assistant

## 2021-04-25 IMAGING — DX DG CHEST 1V PORT
1 series · 1 of 1 positions shown · non-contrast
Comparison: 05/24/2020

CLINICAL DATA: Respiratory failure

EXAM:
PORTABLE CHEST 1 VIEW

[chest ap]
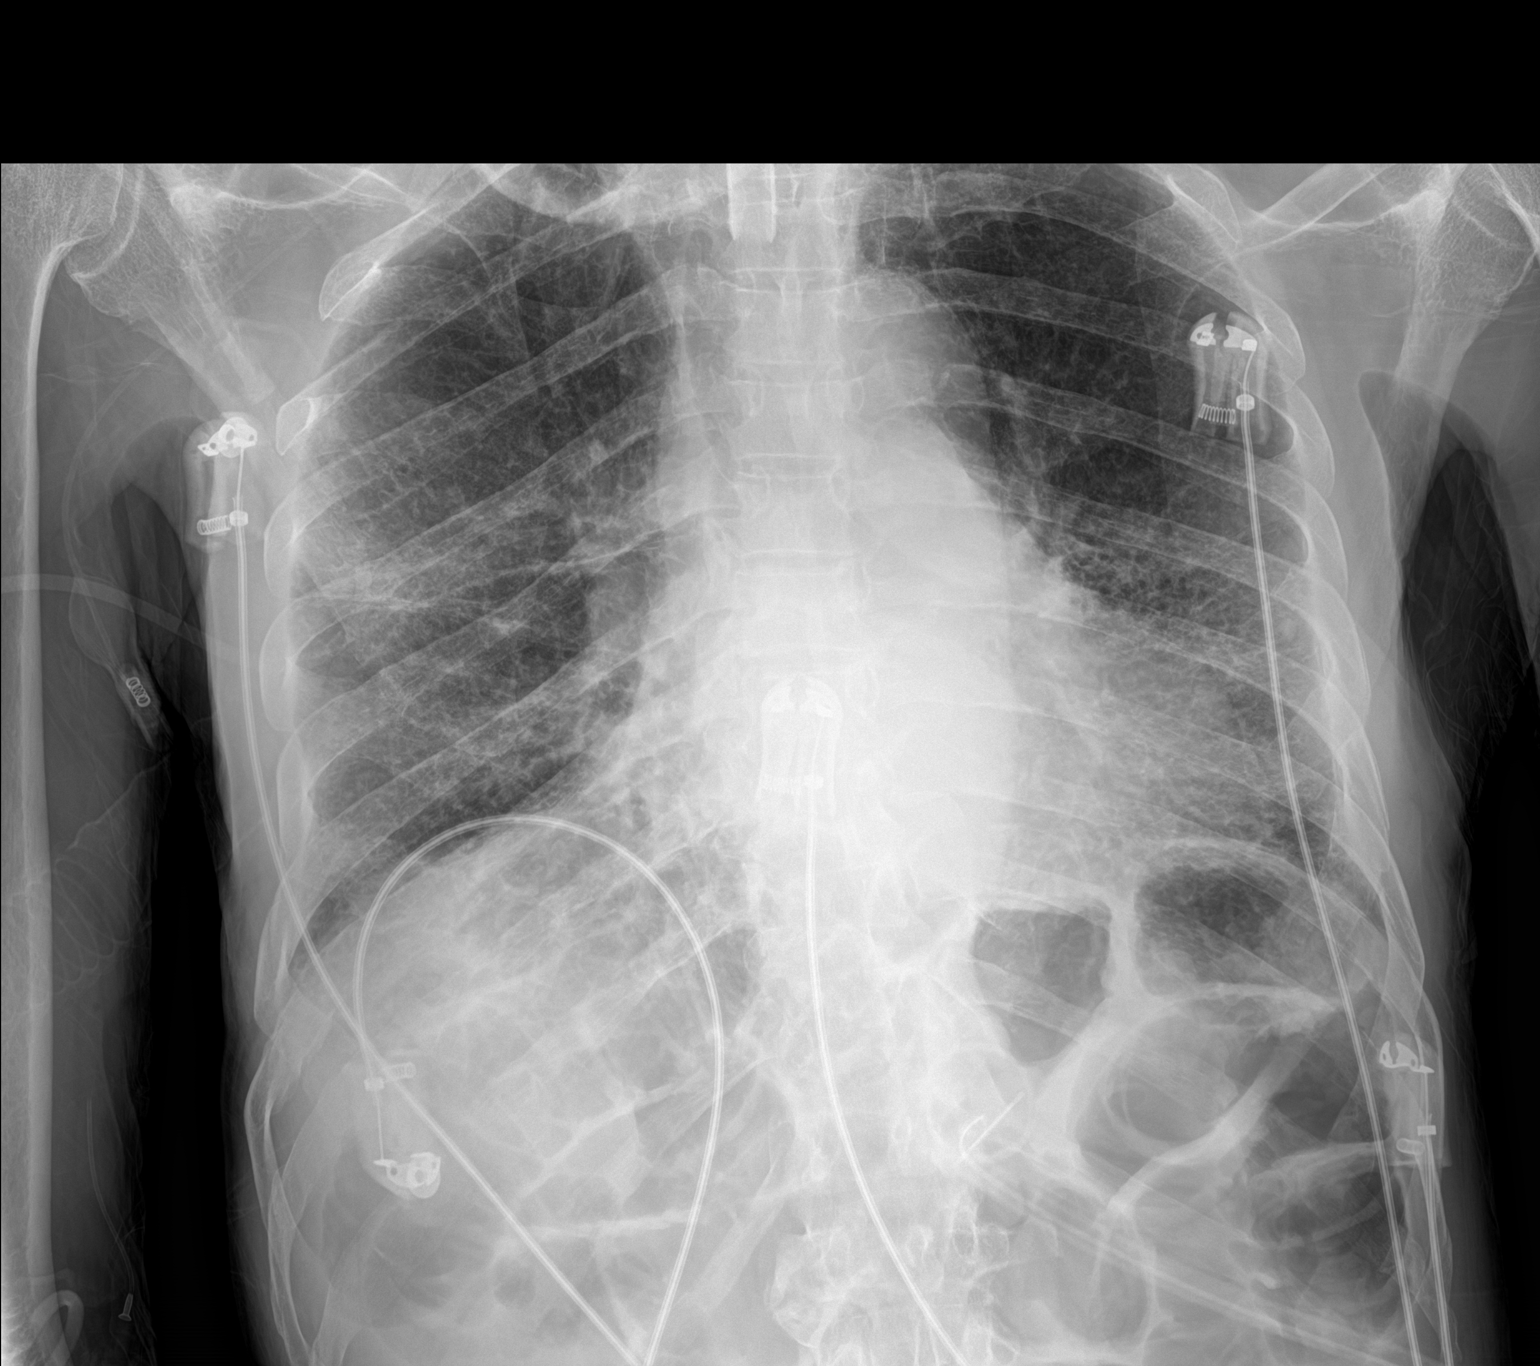

[1 of 1 positions shown; findings below may reference images not displayed]

FINDINGS: Chronic interstitial opacities consistent with interstitial lung
disease. No pleural effusion. Tracheostomy tube tip is just below
the clavicular heads. Cardiomediastinal contours are normal.
IMPRESSION: Interstitial lung disease without acute airspace disease.

## 2021-04-25 IMAGING — DX DG ABDOMEN 1V
1 series · 1 of 1 positions shown · non-contrast
Comparison: None.

CLINICAL DATA: Gastrostomy tube verification

EXAM:
ABDOMEN - 1 VIEW

[abdomen kub]
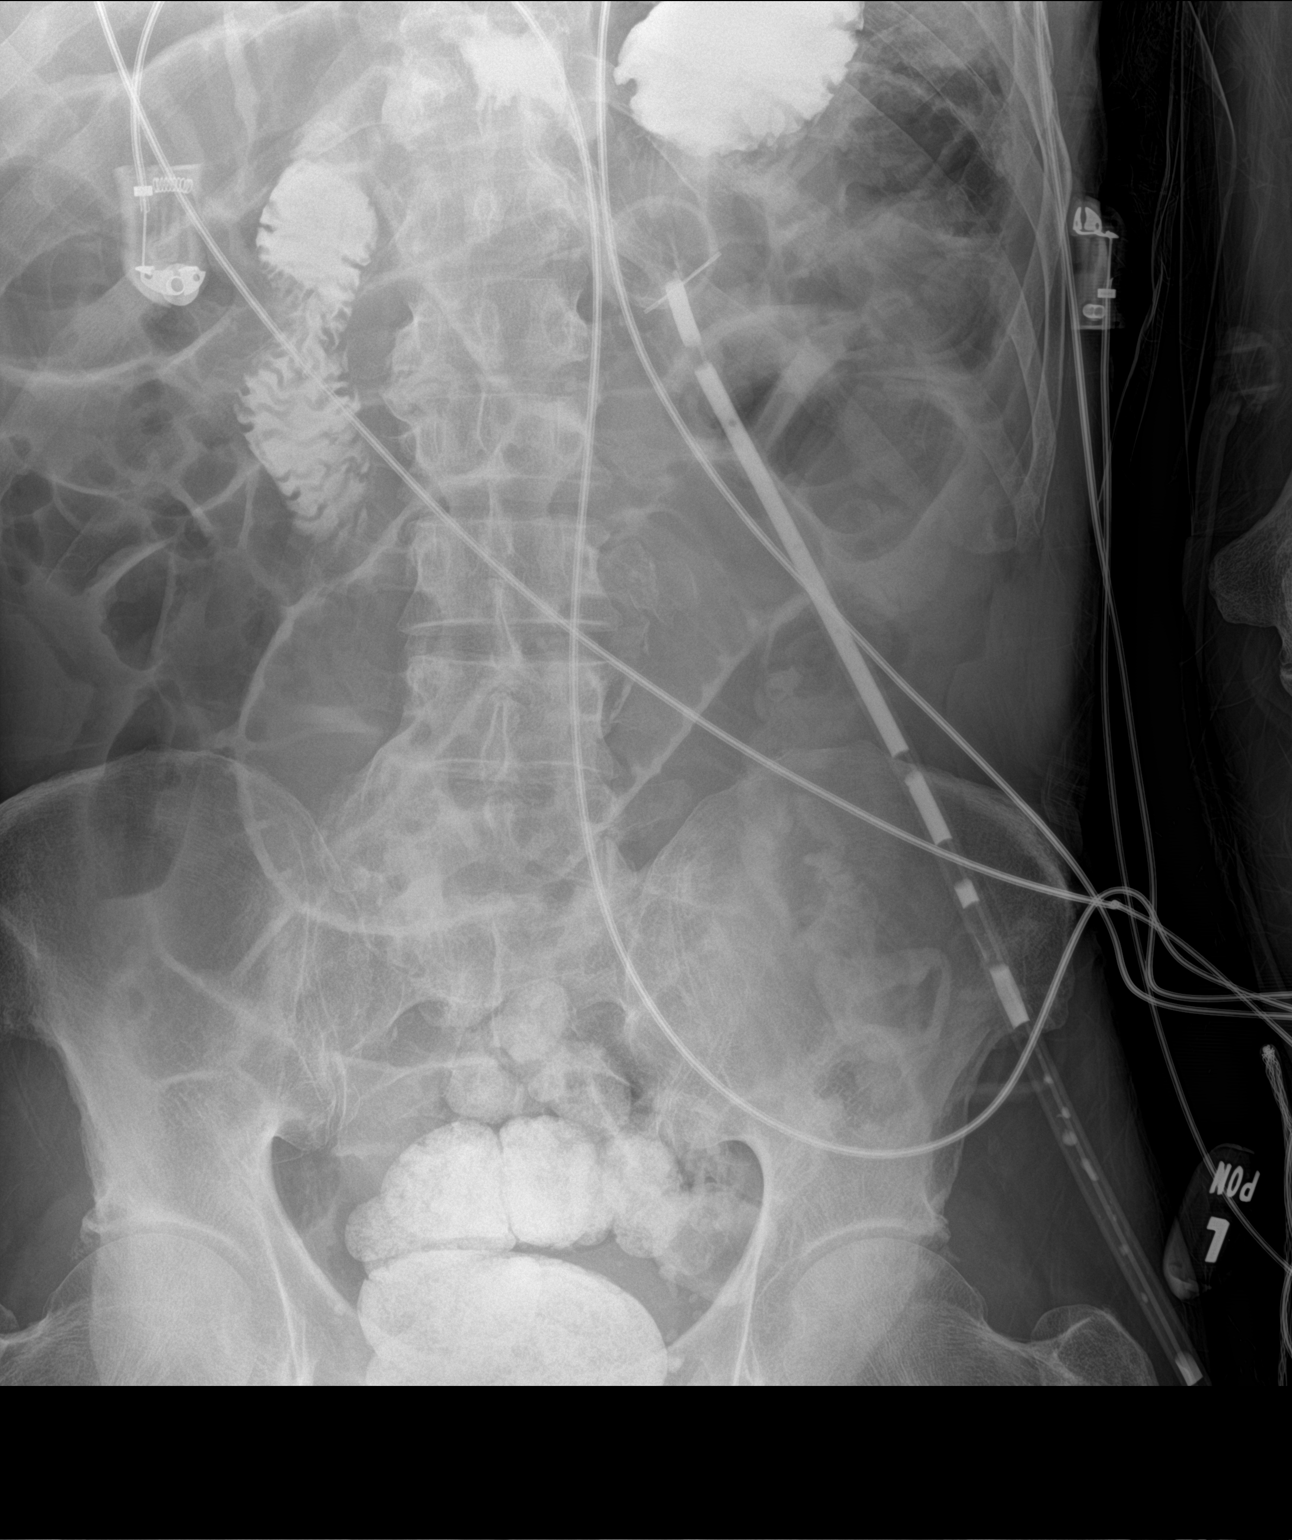

[1 of 1 positions shown; findings below may reference images not displayed]

FINDINGS: There is enteric contrast within the stomach and first and second
portions of the duodenum. There is also enteric contrast in the
rectum.
IMPRESSION: Enteric contrast in the stomach and first and second portions of the
duodenum, confirming intragastric position of the gastrostomy tube.

## 2021-05-03 IMAGING — DX DG ABD PORTABLE 1V
1 series · 2 of 2 positions shown · non-contrast
Comparison: 06/10/2020

CLINICAL DATA: Follow-up ileus

EXAM:
PORTABLE ABDOMEN - 1 VIEW

[Series 1: abdomen · 0.14mm/px · 2 of 2 slices shown]
[im 1/2]
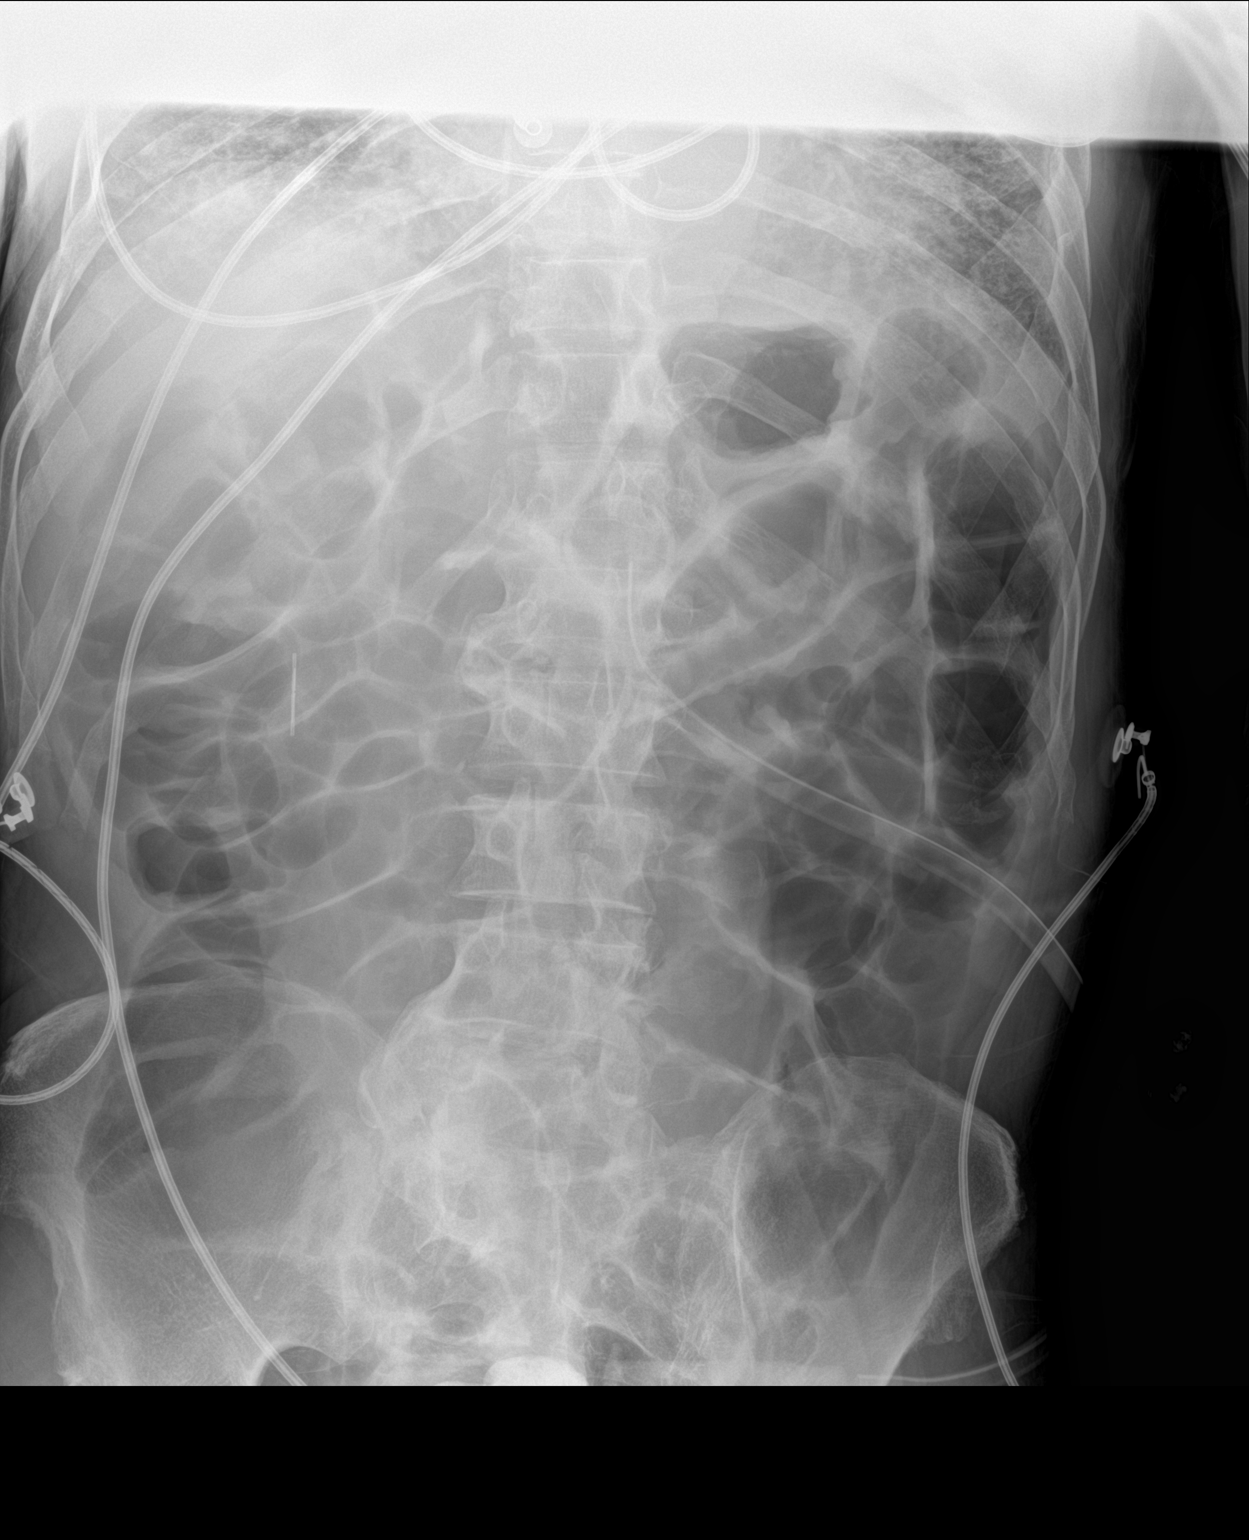
[im 2/2]
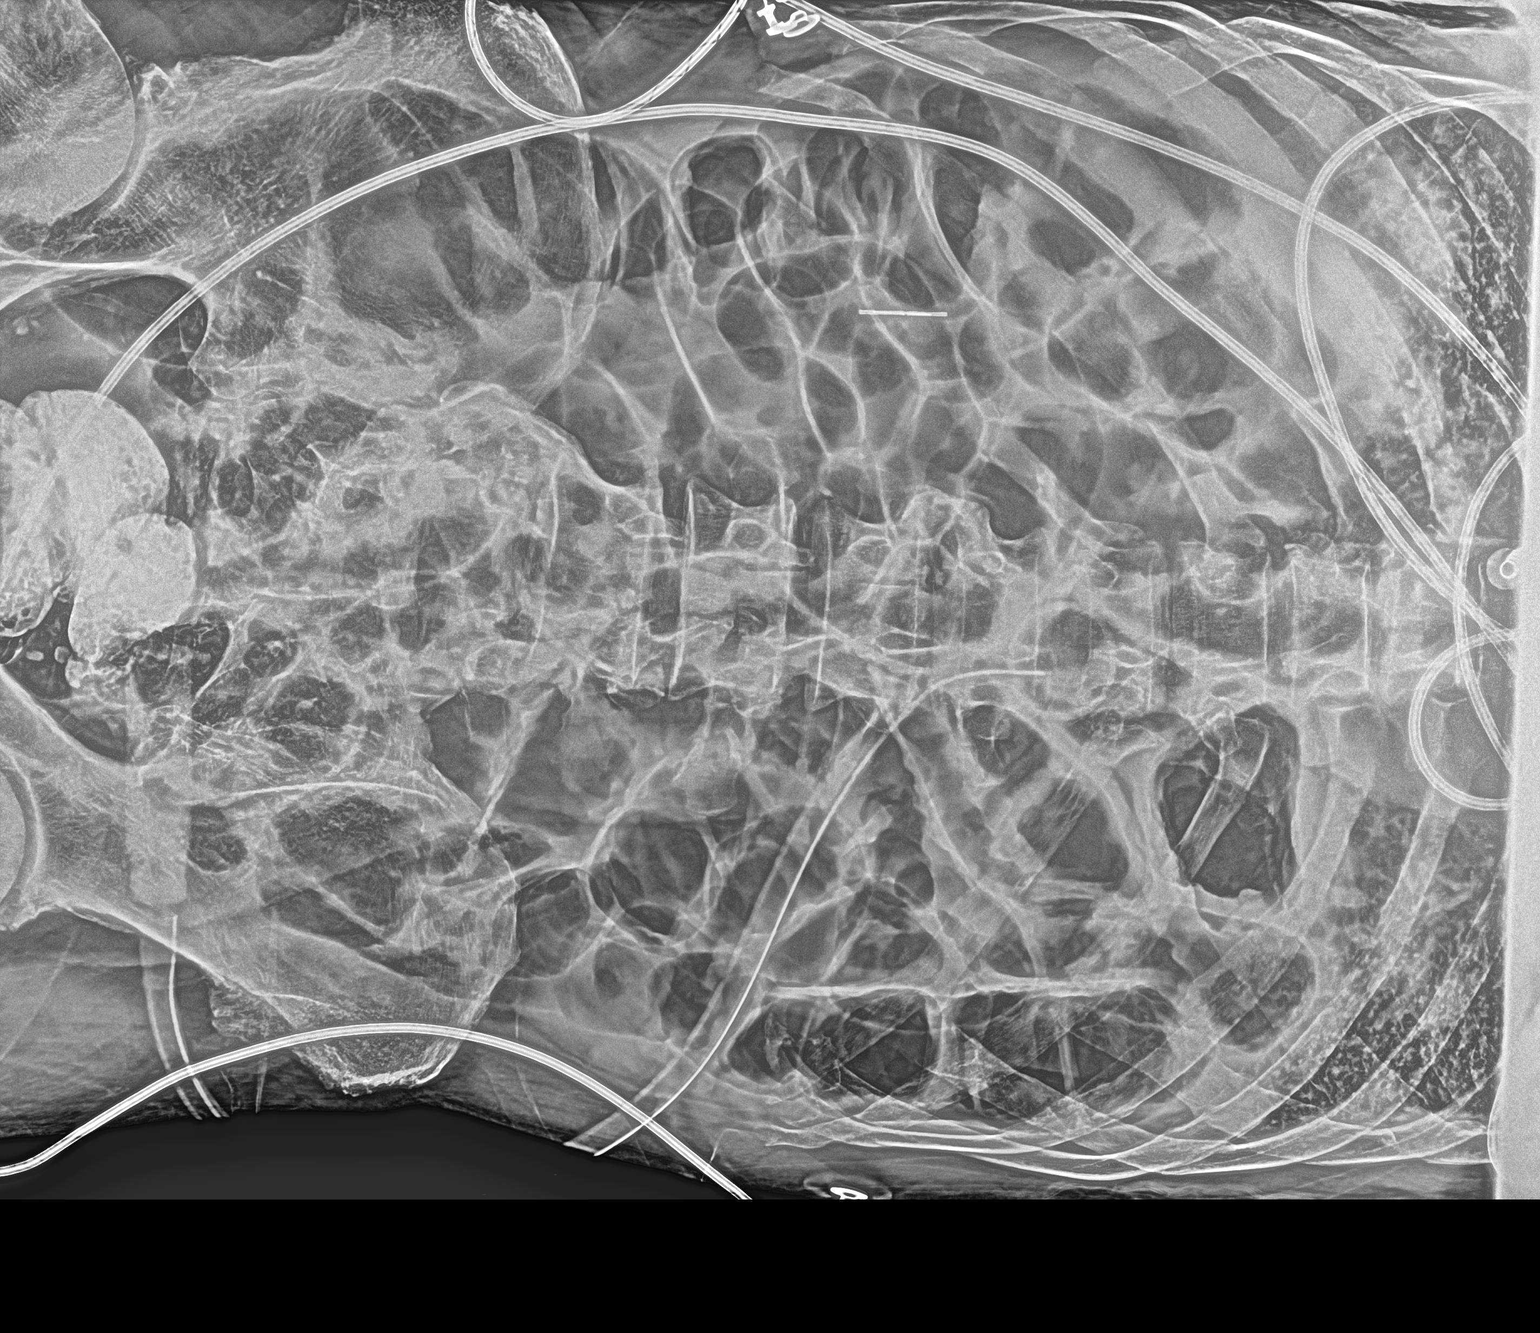

[2 of 2 positions shown; findings below may reference images not displayed]

FINDINGS: Scattered large and small bowel gas is noted. The overall appearance
is stable from the prior exam. Gastrostomy catheter is noted in
place. Prior T tack fixation device has migrated distally. No free
air is noted.
IMPRESSION: Scattered large and small bowel gas compatible with diffuse ileus.
The overall appearance is stable.

## 2021-05-06 IMAGING — DX DG ABDOMEN 1V
1 series · 1 of 1 positions shown · non-contrast
Comparison: 06/12/2020.  06/10/2020.  06/08/2020.

CLINICAL DATA: Ileus.

EXAM:
ABDOMEN - 1 VIEW

[abdomen kub]
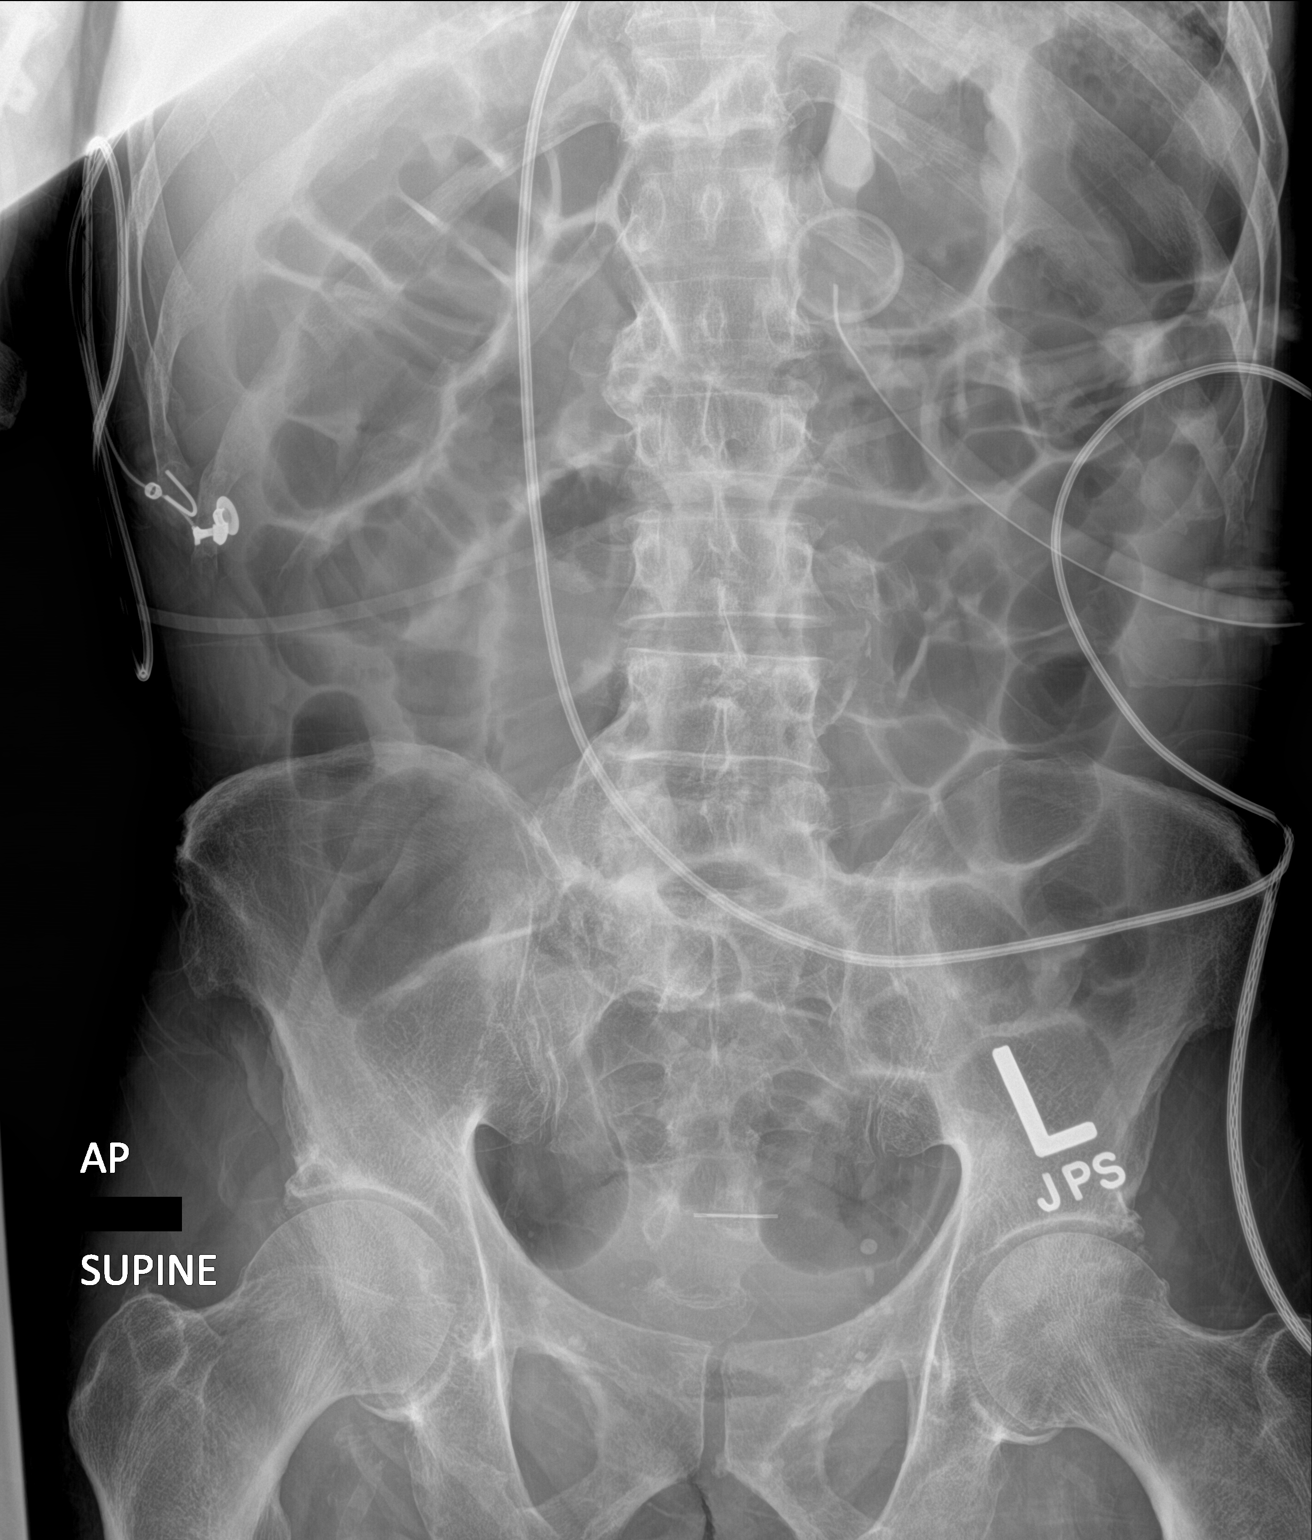

[1 of 1 positions shown; findings below may reference images not displayed]

FINDINGS: Gastrostomy tube noted in stable position. Metallic fixation device
continues to migrate distally, it now appears over the rectum.
Diffuse small and large bowel prominence again noted consistent
adynamic ileus. No interim change. Follow-up exams to demonstrate
resolution and to exclude bowel obstruction suggested. Aortoiliac
atherosclerotic vascular calcification.
IMPRESSION: 1. Gastrostomy tube in stable position. Metallic fixation device
continues to migrate distally, and now appears over the rectum.

2. Diffuse small and large bowel prominence again noted consistent
adynamic ileus. No interim change.

## 2021-05-07 IMAGING — DX DG CHEST 1V PORT
1 series · 1 of 1 positions shown · non-contrast
Comparison: [DATE] [DATE], [DATE] ([DATE] a.m.)

CLINICAL DATA: Congestive heart failure.

EXAM:
PORTABLE CHEST 1 VIEW

[chest ap]
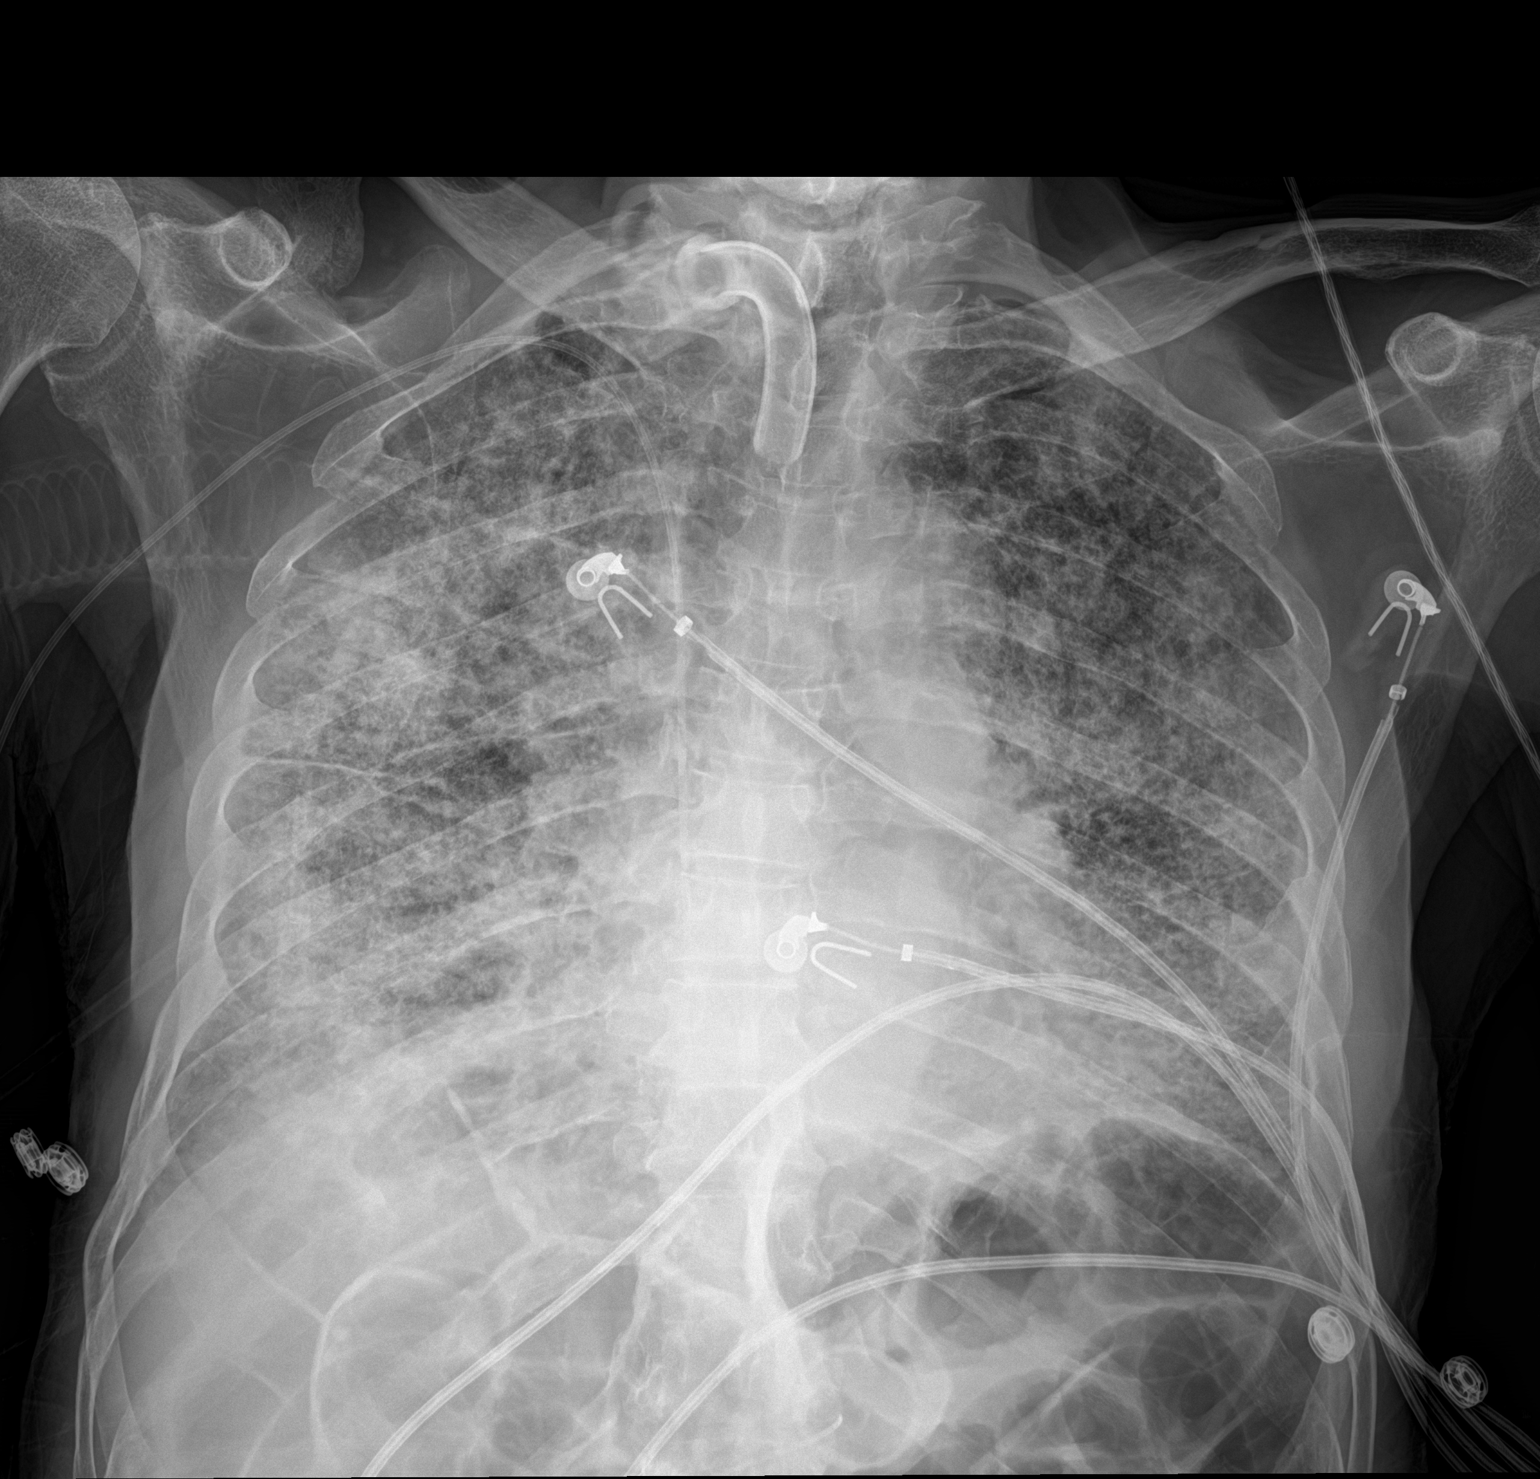

[1 of 1 positions shown; findings below may reference images not displayed]

FINDINGS: There is stable tracheostomy tube and right-sided PICC line
positioning. Marked severity diffuse interstitial infiltrates are
seen. This is mildly increased in severity when compared to the
prior study. There is no evidence of a pleural effusion or
pneumothorax. The heart size and mediastinal contours are within
normal limits. The visualized skeletal structures are unremarkable.
IMPRESSION: Marked severity diffuse interstitial infiltrates, mildly increased
in severity when compared to the prior study.
